# Patient Record
Sex: Male | Born: 1985 | Race: White | Hispanic: No | Smoking: Former smoker
Health system: Southern US, Community
[De-identification: ages and names within clinical notes are randomized; demographics above are authoritative.]

## PROBLEM LIST (undated history)

## (undated) DIAGNOSIS — Z789 Other specified health status: Secondary | ICD-10-CM

## (undated) HISTORY — PX: NO PAST SURGERIES: SHX2092

---

## 2021-03-25 ENCOUNTER — Emergency Department (HOSPITAL_BASED_OUTPATIENT_CLINIC_OR_DEPARTMENT_OTHER): Payer: 59

## 2021-03-25 ENCOUNTER — Other Ambulatory Visit: Payer: Self-pay

## 2021-03-25 ENCOUNTER — Emergency Department (HOSPITAL_BASED_OUTPATIENT_CLINIC_OR_DEPARTMENT_OTHER)
Admission: EM | Admit: 2021-03-25 | Discharge: 2021-03-25 | Disposition: A | Discharge status: Home / Self Care | Payer: 59 | Attending: Emergency Medicine | Admitting: Emergency Medicine

## 2021-03-25 ENCOUNTER — Encounter (HOSPITAL_BASED_OUTPATIENT_CLINIC_OR_DEPARTMENT_OTHER): Payer: Self-pay

## 2021-03-25 DIAGNOSIS — N341 Nonspecific urethritis: Secondary | ICD-10-CM | POA: Insufficient documentation

## 2021-03-25 DIAGNOSIS — L03314 Cellulitis of groin: Secondary | ICD-10-CM | POA: Diagnosis not present

## 2021-03-25 DIAGNOSIS — F17210 Nicotine dependence, cigarettes, uncomplicated: Secondary | ICD-10-CM | POA: Diagnosis not present

## 2021-03-25 DIAGNOSIS — R1909 Other intra-abdominal and pelvic swelling, mass and lump: Secondary | ICD-10-CM | POA: Diagnosis present

## 2021-03-25 DIAGNOSIS — N342 Other urethritis: Secondary | ICD-10-CM

## 2021-03-25 LAB — CBC WITH DIFFERENTIAL/PLATELET
Abs Immature Granulocytes: 0.02 10*3/uL (ref 0.00–0.07)
Basophils Absolute: 0.1 10*3/uL (ref 0.0–0.1)
Basophils Relative: 1 %
Eosinophils Absolute: 0 10*3/uL (ref 0.0–0.5)
Eosinophils Relative: 0 %
HCT: 41.1 % (ref 39.0–52.0)
Hemoglobin: 14.3 g/dL (ref 13.0–17.0)
Immature Granulocytes: 0 %
Lymphocytes Relative: 29 %
Lymphs Abs: 2.1 10*3/uL (ref 0.7–4.0)
MCH: 30.9 pg (ref 26.0–34.0)
MCHC: 34.8 g/dL (ref 30.0–36.0)
MCV: 88.8 fL (ref 80.0–100.0)
Monocytes Absolute: 0.7 10*3/uL (ref 0.1–1.0)
Monocytes Relative: 10 %
Neutro Abs: 4.4 10*3/uL (ref 1.7–7.7)
Neutrophils Relative %: 60 %
Platelets: 181 10*3/uL (ref 150–400)
RBC: 4.63 MIL/uL (ref 4.22–5.81)
RDW: 12.9 % (ref 11.5–15.5)
WBC Morphology: ABNORMAL
WBC: 7.2 10*3/uL (ref 4.0–10.5)
nRBC: 0 % (ref 0.0–0.2)

## 2021-03-25 LAB — URINALYSIS, MICROSCOPIC (REFLEX)

## 2021-03-25 LAB — COMPREHENSIVE METABOLIC PANEL
ALT: 31 U/L (ref 0–44)
AST: 35 U/L (ref 15–41)
Albumin: 4.3 g/dL (ref 3.5–5.0)
Alkaline Phosphatase: 45 U/L (ref 38–126)
Anion gap: 11 (ref 5–15)
BUN: 14 mg/dL (ref 6–20)
CO2: 25 mmol/L (ref 22–32)
Calcium: 9.3 mg/dL (ref 8.9–10.3)
Chloride: 99 mmol/L (ref 98–111)
Creatinine, Ser: 0.92 mg/dL (ref 0.61–1.24)
GFR, Estimated: >60 mL/min (ref >60)
Glucose, Bld: 98 mg/dL (ref 70–99)
Potassium: 3.7 mmol/L (ref 3.5–5.1)
Sodium: 135 mmol/L (ref 135–145)
Total Bilirubin: 0.7 mg/dL (ref 0.3–1.2)
Total Protein: 8.1 g/dL (ref 6.5–8.1)

## 2021-03-25 LAB — URINALYSIS, ROUTINE W REFLEX MICROSCOPIC
Bilirubin Urine: NEGATIVE
Glucose, UA: NEGATIVE mg/dL
Hgb urine dipstick: NEGATIVE
Ketones, ur: 15 mg/dL — AB
Leukocytes,Ua: NEGATIVE
Nitrite: NEGATIVE
Protein, ur: 30 mg/dL — AB
Specific Gravity, Urine: 1.03 (ref 1.005–1.030)
pH: 5 (ref 5.0–8.0)

## 2021-03-25 LAB — LACTIC ACID, PLASMA: Lactic Acid, Venous: 0.9 mmol/L (ref 0.5–1.9)

## 2021-03-25 MED ORDER — IOHEXOL 350 MG/ML SOLN
85.0000 mL | Freq: Once | INTRAVENOUS | Status: AC | PRN
  Administered 2021-03-25: 85 mL via INTRAVENOUS

## 2021-03-25 MED ORDER — DOXYCYCLINE HYCLATE 100 MG PO TABS: 100.0000 mg | ORAL_TABLET | Freq: Two times a day (BID) | ORAL | 0 refills | Status: DC

## 2021-03-25 MED ORDER — LIDOCAINE HCL (PF) 1 % IJ SOLN
1.0000 mL | Freq: Once | INTRAMUSCULAR | Status: AC
  Administered 2021-03-25: 1 mL
  Filled 2021-03-25: qty 5

## 2021-03-25 MED ORDER — CEFTRIAXONE SODIUM 500 MG IJ SOLR
500.0000 mg | Freq: Once | INTRAMUSCULAR | Status: AC
  Administered 2021-03-25: 500 mg via INTRAMUSCULAR
  Filled 2021-03-25: qty 500

## 2021-03-25 MED ORDER — SODIUM CHLORIDE 0.9 % IV SOLN: INTRAVENOUS | Status: DC

## 2021-03-25 NOTE — ED Notes (Signed)
Pt NAD, a/ox4. Pt verbalizes understanding of all DC and f/u instructions. All questions answered. Pt walks with steady gait to lobby at DC.  ? ?

## 2021-03-25 NOTE — Discharge Instructions (Addendum)
Take the antibiotic doxycycline as directed for the next 7 days.  Work note provided.  Return for any new or worse symptoms.  Would expect improvement over the next couple days.  But will not start to improve significantly until after 2 days.  Also there is evidence of urethritis and discharge.  Avoid sexual intercourse until that clears.

## 2021-03-25 NOTE — ED Notes (Signed)
Patient transported to CT 

## 2021-03-25 NOTE — ED Notes (Signed)
Pt back from CT

## 2021-03-25 NOTE — ED Triage Notes (Signed)
Pt c/o swelling/redness to groin/penis yesterday-states may be r/t to spider bite-states he had a fever 3 days ago-NAD-steady gait

## 2021-03-25 NOTE — ED Provider Notes (Signed)
[facility] EMERGENCY DEPARTMENT Provider Note   CSN: [id number] Arrival date & time: 03/25/21  1558     History Chief Complaint  Patient presents with   Groin Swelling    James Obrien is a 35 y.o. male.  Patient with a lot of erythema and some lesions to his suprapubic more left-sided groin area and some lesions on his penis.  And a discharge.  Patient thinks that he got bit by a spider.  Because he found a dead spider in his bed.  On Oct 28, 2022 he had chills that lasted all day long if and he felt quite cold.  He vomited once.  On 10-28-2022 he just had some redness in the suprapubic area.  And then that got significantly worse between 28-Oct-2022 and today.  Some slight difficulty with urination but he is able to urinate.  Patient's temp upon arrival here was 100.1.  Heart rate 113 blood pressure 121/78 respirations 18 oxygen saturation is 98%.  Some concerns for possible early sepsis.  Patient was fairly nontoxic in appearance      History reviewed. No pertinent past medical history.  There are no problems to display for this patient.   History reviewed. No pertinent surgical history.     No family history on file.  Social History   Tobacco Use   Smoking status: Every Day    Types: Cigarettes   Smokeless tobacco: Never  Vaping Use   Vaping Use: Never used  Substance Use Topics   Alcohol use: Yes    Comment: occ   Drug use: Never    Home Medications Prior to Admission medications   Medication Sig Start Date End Date Taking? Authorizing Provider  doxycycline (VIBRA-TABS) 100 MG tablet Take 1 tablet (100 mg total) by mouth 2 (two) times daily for 7 days. 03/25/21 04/01/21 Yes Vanetta Mulders, MD    Allergies    Patient has no known allergies.  Review of Systems   Review of Systems  Constitutional:  Positive for chills and fever.  HENT:  Negative for ear pain and sore throat.   Eyes:  Negative for pain and visual disturbance.  Respiratory:   Negative for cough and shortness of breath.   Cardiovascular:  Negative for chest pain and palpitations.  Gastrointestinal:  Positive for vomiting. Negative for abdominal pain and diarrhea.  Genitourinary:  Positive for difficulty urinating and penile discharge. Negative for dysuria, hematuria, scrotal swelling and testicular pain.  Musculoskeletal:  Negative for arthralgias and back pain.  Skin:  Negative for color change and rash.  Neurological:  Negative for seizures and syncope.  All other systems reviewed and are negative.  Physical Exam Updated Vital Signs BP 128/67 (BP Location: Right Arm)   Pulse 80   Temp 100.1 F (37.8 C) (Oral)   Resp 18   Ht 1.803 m (5\' 11" )   Wt 68.9 kg   SpO2 97%   BMI 21.20 kg/m   Physical Exam Vitals and nursing note reviewed.  Constitutional:      Appearance: Normal appearance. He is well-developed.  HENT:     Head: Normocephalic and atraumatic.  Eyes:     Extraocular Movements: Extraocular movements intact.     Conjunctiva/sclera: Conjunctivae normal.     Pupils: Pupils are equal, round, and reactive to light.  Cardiovascular:     Rate and Rhythm: Normal rate and regular rhythm.     Heart sounds: No murmur heard. Pulmonary:     Effort: Pulmonary effort is normal. No  respiratory distress.     Breath sounds: Normal breath sounds.  Abdominal:     Palpations: Abdomen is soft.     Tenderness: There is no abdominal tenderness.  Genitourinary:    Testes: Normal.     Comments: Left suprapubic and left groin area with a large area of deep erythema measuring about 5 x 10 cm.  With some induration no fluctuance.  There are several closed nonvesicular skin lesions in that area.  And also on his penis.  And there is a urethral purulent discharge.  Testicles without any scrotal swelling no tenderness to the testicle area.  No evidence of any hernia. Musculoskeletal:     Cervical back: Normal range of motion and neck supple.  Skin:    General: Skin  is warm and dry.     Capillary Refill: Capillary refill takes less than 2 seconds.  Neurological:     General: No focal deficit present.     Mental Status: He is alert and oriented to person, place, and time.     Cranial Nerves: No cranial nerve deficit.     Sensory: No sensory deficit.    ED Results / Procedures / Treatments   Labs (all labs ordered are listed, but only abnormal results are displayed) Labs Reviewed  URINALYSIS, ROUTINE W REFLEX MICROSCOPIC - Abnormal; Notable for the following components:      Result Value   Color, Urine AMBER (*)    APPearance CLOUDY (*)    Ketones, ur 15 (*)    Protein, ur 30 (*)    All other components within normal limits  URINALYSIS, MICROSCOPIC (REFLEX) - Abnormal; Notable for the following components:   Bacteria, UA FEW (*)    All other components within normal limits  CULTURE, BLOOD (ROUTINE X 2)  CULTURE, BLOOD (ROUTINE X 2)  COMPREHENSIVE METABOLIC PANEL  CBC WITH DIFFERENTIAL/PLATELET  LACTIC ACID, PLASMA  RPR  HIV ANTIBODY (ROUTINE TESTING W REFLEX)  GC/CHLAMYDIA PROBE AMP ([place]) NOT AT [facility]    EKG None  Radiology CT Abdomen Pelvis W Contrast  Result Date: 03/25/2021 CLINICAL DATA:  Inguinal swelling, abscess EXAM: CT ABDOMEN AND PELVIS WITH CONTRAST TECHNIQUE: Multidetector CT imaging of the abdomen and pelvis was performed using the standard protocol following bolus administration of intravenous contrast. CONTRAST:  92mL OMNIPAQUE IOHEXOL 350 MG/ML SOLN COMPARISON:  None. FINDINGS: Lower chest: No acute pleural or parenchymal lung disease. Hepatobiliary: No focal liver abnormality is seen. No gallstones, gallbladder wall thickening, or biliary dilatation. Pancreas: Unremarkable. No pancreatic ductal dilatation or surrounding inflammatory changes. Spleen: Normal in size without focal abnormality. Adrenals/Urinary Tract: The kidneys enhance normally and symmetrically. No urinary tract calculi or obstructive uropathy. The  bladder is decompressed, which limits evaluation. The adrenals are unremarkable. Stomach/Bowel: No bowel obstruction or ileus. Normal appendix right lower quadrant. No bowel wall thickening or inflammatory change. Vascular/Lymphatic: No significant vascular findings. Lymphadenopathy is seen throughout the pelvis. Largest lymph node in the right external iliac chain measures 16 mm in short axis reference image 67/2. There are numerous enlarged bilateral inguinal lymph nodes, largest on the right measuring up to 13 mm in short axis reference image 76/2. These are likely reactive. Reproductive: Prostate is unremarkable. Other: There is subcutaneous fat stranding within the left lower anterior abdominal wall extending into the left inguinal region, consistent with cellulitis. There is no underlying fluid collection or abscess. No free intraperitoneal fluid or free gas. No abdominal wall hernia. Musculoskeletal: No acute or destructive bony lesions. Reconstructed  images demonstrate no additional findings. IMPRESSION: 1. Subcutaneous fat stranding left lower quadrant anterior abdominal wall and left inguinal region, consistent with cellulitis. No fluid collection or abscess. 2. Reactive lymphadenopathy within the pelvis and bilateral inguinal regions as above. Electronically Signed   By: Sharlet Salina M.D.   On: 03/25/2021 19:05    Procedures Procedures   Medications Ordered in ED Medications  0.9 %  sodium chloride infusion ( Intravenous New Bag/Given 03/25/21 1723)  iohexol (OMNIPAQUE) 350 MG/ML injection 85 mL (85 mLs Intravenous Contrast Given 03/25/21 1755)  cefTRIAXone (ROCEPHIN) injection 500 mg (500 mg Intramuscular Given 03/25/21 2029)  lidocaine (PF) (XYLOCAINE) 1 % injection 1 mL (1 mL Other Given 03/25/21 2028)    ED Course  I have reviewed the triage vital signs and the nursing notes.  Pertinent labs & imaging results that were available during my care of the patient were reviewed by me and  considered in my medical decision making (see chart for details).    MDM Rules/Calculators/A&P                          CRITICAL CARE Performed by: Vanetta Mulders Total critical care time: 35 minutes Critical care time was exclusive of separately billable procedures and treating other patients. Critical care was necessary to treat or prevent imminent or life-threatening deterioration. Critical care was time spent personally by me on the following activities: development of treatment plan with patient and/or surrogate as well as nursing, discussions with consultants, evaluation of patient's response to treatment, examination of patient, obtaining history from patient or surrogate, ordering and performing treatments and interventions, ordering and review of laboratory studies, ordering and review of radiographic studies, pulse oximetry and re-evaluation of patient's condition.   Patient's lactic acid not elevated.  Blood cultures were sent.  Complete metabolic panel without any abnormalities no liver function test abnormalities.  No leukocytosis.  White blood cell count 7.2.  Hemoglobin 14.3.  Urinalysis not consistent with urinary tract infection.  Clinically there is a urethral discharge.  STD stuff sent.  CT abdomen pelvis shows just a cellulitis in that area no deep space infections.  Patient will be treated with doxycycline for the cellulitis and also treated with Rocephin for STD.  The doxycycline for STD as well.  Patient stable for discharge home.  Patient will return for any new or worse symptoms. Final Clinical Impression(s) / ED Diagnoses Final diagnoses:  Cellulitis of groin  Urethritis    Rx / DC Orders ED Discharge Orders          Ordered    doxycycline (VIBRA-TABS) 100 MG tablet  2 times daily        03/25/21 2020             Vanetta Mulders, MD 03/25/21 2045

## 2021-03-26 LAB — GC/CHLAMYDIA PROBE AMP (~~LOC~~) NOT AT ARMC
Chlamydia: NEGATIVE
Comment: NEGATIVE
Comment: NORMAL
Neisseria Gonorrhea: POSITIVE — AB

## 2021-03-26 LAB — HIV ANTIBODY (ROUTINE TESTING W REFLEX): HIV Screen 4th Generation wRfx: NONREACTIVE

## 2021-03-26 LAB — RPR: RPR Ser Ql: NONREACTIVE

## 2021-03-28 ENCOUNTER — Emergency Department (HOSPITAL_COMMUNITY): Payer: 59

## 2021-03-28 ENCOUNTER — Inpatient Hospital Stay (HOSPITAL_COMMUNITY)
Admission: EM | Admit: 2021-03-28 | Discharge: 2021-04-06 | DRG: 098 | Disposition: A | Discharge status: Rehabilitation Facility | Payer: 59 | Attending: Internal Medicine | Admitting: Internal Medicine

## 2021-03-28 ENCOUNTER — Observation Stay (HOSPITAL_COMMUNITY): Payer: 59

## 2021-03-28 ENCOUNTER — Other Ambulatory Visit: Payer: Self-pay

## 2021-03-28 ENCOUNTER — Encounter (HOSPITAL_COMMUNITY): Payer: Self-pay | Admitting: Radiology

## 2021-03-28 DIAGNOSIS — A5409 Other gonococcal infection of lower genitourinary tract: Secondary | ICD-10-CM | POA: Diagnosis present

## 2021-03-28 DIAGNOSIS — G0481 Other encephalitis and encephalomyelitis: Secondary | ICD-10-CM | POA: Diagnosis present

## 2021-03-28 DIAGNOSIS — T380X5A Adverse effect of glucocorticoids and synthetic analogues, initial encounter: Secondary | ICD-10-CM | POA: Diagnosis present

## 2021-03-28 DIAGNOSIS — R569 Unspecified convulsions: Secondary | ICD-10-CM | POA: Diagnosis not present

## 2021-03-28 DIAGNOSIS — F17210 Nicotine dependence, cigarettes, uncomplicated: Secondary | ICD-10-CM | POA: Diagnosis present

## 2021-03-28 DIAGNOSIS — L0390 Cellulitis, unspecified: Secondary | ICD-10-CM | POA: Diagnosis present

## 2021-03-28 DIAGNOSIS — Z79899 Other long term (current) drug therapy: Secondary | ICD-10-CM

## 2021-03-28 DIAGNOSIS — R339 Retention of urine, unspecified: Secondary | ICD-10-CM | POA: Diagnosis present

## 2021-03-28 DIAGNOSIS — A419 Sepsis, unspecified organism: Secondary | ICD-10-CM

## 2021-03-28 DIAGNOSIS — K592 Neurogenic bowel, not elsewhere classified: Secondary | ICD-10-CM | POA: Diagnosis present

## 2021-03-28 DIAGNOSIS — R3915 Urgency of urination: Secondary | ICD-10-CM | POA: Diagnosis present

## 2021-03-28 DIAGNOSIS — G0490 Encephalitis and encephalomyelitis, unspecified: Secondary | ICD-10-CM | POA: Diagnosis not present

## 2021-03-28 DIAGNOSIS — N319 Neuromuscular dysfunction of bladder, unspecified: Secondary | ICD-10-CM | POA: Diagnosis present

## 2021-03-28 DIAGNOSIS — E872 Acidosis, unspecified: Secondary | ICD-10-CM | POA: Diagnosis present

## 2021-03-28 DIAGNOSIS — L03314 Cellulitis of groin: Secondary | ICD-10-CM | POA: Diagnosis present

## 2021-03-28 DIAGNOSIS — Z20822 Contact with and (suspected) exposure to covid-19: Secondary | ICD-10-CM | POA: Diagnosis present

## 2021-03-28 DIAGNOSIS — G8221 Paraplegia, complete: Secondary | ICD-10-CM | POA: Diagnosis present

## 2021-03-28 DIAGNOSIS — R079 Chest pain, unspecified: Secondary | ICD-10-CM

## 2021-03-28 DIAGNOSIS — A549 Gonococcal infection, unspecified: Secondary | ICD-10-CM | POA: Diagnosis present

## 2021-03-28 DIAGNOSIS — G40409 Other generalized epilepsy and epileptic syndromes, not intractable, without status epilepticus: Secondary | ICD-10-CM | POA: Diagnosis present

## 2021-03-28 DIAGNOSIS — L03311 Cellulitis of abdominal wall: Secondary | ICD-10-CM | POA: Diagnosis present

## 2021-03-28 DIAGNOSIS — E876 Hypokalemia: Secondary | ICD-10-CM | POA: Diagnosis present

## 2021-03-28 DIAGNOSIS — R001 Bradycardia, unspecified: Secondary | ICD-10-CM

## 2021-03-28 DIAGNOSIS — K5900 Constipation, unspecified: Secondary | ICD-10-CM | POA: Diagnosis not present

## 2021-03-28 DIAGNOSIS — G0491 Myelitis, unspecified: Secondary | ICD-10-CM | POA: Diagnosis present

## 2021-03-28 HISTORY — DX: Other specified health status: Z78.9

## 2021-03-28 LAB — CBC WITH DIFFERENTIAL/PLATELET
Abs Immature Granulocytes: 0 10*3/uL (ref 0.00–0.07)
Basophils Absolute: 0.1 10*3/uL (ref 0.0–0.1)
Basophils Relative: 1 %
Eosinophils Absolute: 0.1 10*3/uL (ref 0.0–0.5)
Eosinophils Relative: 1 %
HCT: 39.2 % (ref 39.0–52.0)
Hemoglobin: 12.9 g/dL — ABNORMAL LOW (ref 13.0–17.0)
Lymphocytes Relative: 20 %
Lymphs Abs: 2.2 10*3/uL (ref 0.7–4.0)
MCH: 30.6 pg (ref 26.0–34.0)
MCHC: 32.9 g/dL (ref 30.0–36.0)
MCV: 92.9 fL (ref 80.0–100.0)
Monocytes Absolute: 0.6 10*3/uL (ref 0.1–1.0)
Monocytes Relative: 6 %
Neutro Abs: 7.8 10*3/uL — ABNORMAL HIGH (ref 1.7–7.7)
Neutrophils Relative %: 72 %
Platelets: 214 10*3/uL (ref 150–400)
RBC: 4.22 MIL/uL (ref 4.22–5.81)
RDW: 13.2 % (ref 11.5–15.5)
WBC: 10.8 10*3/uL — ABNORMAL HIGH (ref 4.0–10.5)
nRBC: 0 % (ref 0.0–0.2)
nRBC: 0 /100 WBC

## 2021-03-28 LAB — COMPREHENSIVE METABOLIC PANEL
ALT: 25 U/L (ref 0–44)
AST: 36 U/L (ref 15–41)
Albumin: 3.6 g/dL (ref 3.5–5.0)
Alkaline Phosphatase: 39 U/L (ref 38–126)
Anion gap: 11 (ref 5–15)
BUN: 13 mg/dL (ref 6–20)
CO2: 23 mmol/L (ref 22–32)
Calcium: 8.7 mg/dL — ABNORMAL LOW (ref 8.9–10.3)
Chloride: 104 mmol/L (ref 98–111)
Creatinine, Ser: 1.27 mg/dL — ABNORMAL HIGH (ref 0.61–1.24)
GFR, Estimated: >60 mL/min (ref >60)
Glucose, Bld: 103 mg/dL — ABNORMAL HIGH (ref 70–99)
Potassium: 4.8 mmol/L (ref 3.5–5.1)
Sodium: 138 mmol/L (ref 135–145)
Total Bilirubin: 0.4 mg/dL (ref 0.3–1.2)
Total Protein: 6.9 g/dL (ref 6.5–8.1)

## 2021-03-28 LAB — URINALYSIS, ROUTINE W REFLEX MICROSCOPIC
Bilirubin Urine: NEGATIVE
Glucose, UA: NEGATIVE mg/dL
Hgb urine dipstick: NEGATIVE
Ketones, ur: NEGATIVE mg/dL
Leukocytes,Ua: NEGATIVE
Nitrite: NEGATIVE
Protein, ur: NEGATIVE mg/dL
Specific Gravity, Urine: 1.041 — ABNORMAL HIGH (ref 1.005–1.030)
pH: 6 (ref 5.0–8.0)

## 2021-03-28 LAB — I-STAT CHEM 8, ED
BUN: 12 mg/dL (ref 6–20)
Calcium, Ion: 1.12 mmol/L — ABNORMAL LOW (ref 1.15–1.40)
Chloride: 104 mmol/L (ref 98–111)
Creatinine, Ser: 1.1 mg/dL (ref 0.61–1.24)
Glucose, Bld: 102 mg/dL — ABNORMAL HIGH (ref 70–99)
HCT: 34 % — ABNORMAL LOW (ref 39.0–52.0)
Hemoglobin: 11.6 g/dL — ABNORMAL LOW (ref 13.0–17.0)
Potassium: 4.3 mmol/L (ref 3.5–5.1)
Sodium: 140 mmol/L (ref 135–145)
TCO2: 26 mmol/L (ref 22–32)

## 2021-03-28 LAB — MAGNESIUM: Magnesium: 2.3 mg/dL (ref 1.7–2.4)

## 2021-03-28 LAB — LACTIC ACID, PLASMA
Lactic Acid, Venous: 4.1 mmol/L (ref 0.5–1.9)
Lactic Acid, Venous: 1 mmol/L (ref 0.5–1.9)

## 2021-03-28 LAB — PROTIME-INR
INR: 1.1 (ref 0.8–1.2)
Prothrombin Time: 13.9 seconds (ref 11.4–15.2)

## 2021-03-28 LAB — APTT: aPTT: 28 seconds (ref 24–36)

## 2021-03-28 MED ORDER — LEVETIRACETAM 500 MG PO TABS
500.0000 mg | ORAL_TABLET | Freq: Two times a day (BID) | ORAL | Status: DC
  Administered 2021-03-29 – 2021-04-06 (×18): 500 mg via ORAL
  Filled 2021-03-28 (×4): qty 1
  Filled 2021-03-28: qty 2
  Filled 2021-03-28 (×10): qty 1
  Filled 2021-03-28: qty 2
  Filled 2021-03-28 (×2): qty 1

## 2021-03-28 MED ORDER — LACTATED RINGERS IV SOLN: INTRAVENOUS | Status: DC

## 2021-03-28 MED ORDER — IOHEXOL 300 MG/ML  SOLN
100.0000 mL | Freq: Once | INTRAMUSCULAR | Status: AC | PRN
  Administered 2021-03-28: 100 mL via INTRAVENOUS

## 2021-03-28 MED ORDER — ACETAMINOPHEN 325 MG PO TABS
650.0000 mg | ORAL_TABLET | Freq: Four times a day (QID) | ORAL | Status: DC | PRN
  Administered 2021-03-29: 650 mg via ORAL
  Administered 2021-03-29: 975 mg via ORAL
  Administered 2021-03-30 – 2021-04-05 (×3): 650 mg via ORAL
  Filled 2021-03-28 (×5): qty 2

## 2021-03-28 MED ORDER — SODIUM CHLORIDE 0.9 % IV BOLUS
1000.0000 mL | Freq: Once | INTRAVENOUS | Status: AC
Start: 2021-03-28 — End: 2021-03-28
  Administered 2021-03-28: 1000 mL via INTRAVENOUS

## 2021-03-28 MED ORDER — LORAZEPAM 2 MG/ML IJ SOLN: 1.0000 mg | INTRAMUSCULAR | Status: DC | PRN

## 2021-03-28 MED ORDER — LEVETIRACETAM IN NACL 1000 MG/100ML IV SOLN
1000.0000 mg | Freq: Once | INTRAVENOUS | Status: AC
  Administered 2021-03-28: 1000 mg via INTRAVENOUS
  Filled 2021-03-28: qty 100

## 2021-03-28 MED ORDER — LACTATED RINGERS IV BOLUS
1000.0000 mL | Freq: Once | INTRAVENOUS | Status: AC
  Administered 2021-03-28: 1000 mL via INTRAVENOUS

## 2021-03-28 MED ORDER — ACETAMINOPHEN 650 MG RE SUPP: 650.0000 mg | Freq: Four times a day (QID) | RECTAL | Status: DC | PRN

## 2021-03-28 MED ORDER — SODIUM CHLORIDE 0.9 % IV SOLN: INTRAVENOUS | Status: DC

## 2021-03-28 MED ORDER — ENOXAPARIN SODIUM 40 MG/0.4ML IJ SOSY
40.0000 mg | PREFILLED_SYRINGE | INTRAMUSCULAR | Status: DC
  Administered 2021-03-28: 40 mg via SUBCUTANEOUS
  Filled 2021-03-28: qty 0.4

## 2021-03-28 NOTE — ED Triage Notes (Signed)
Pt presented with [facility] with c/c of seizure. Pt is from work [employer]), pt was bitten by spider 1 week ago and was given antibiotics. Today pt was complaining of tingling to left hand. Pt was sitting in office when he started having grand mal seizures and fell out of chair but didn't hit head. Coworkers stated seizure last 10 mins. Pt didn't remember any of event. But still had L arm drift when he begin to go into another grand mal seizure with EMS. EMS stated seizure was face twisting with decorticate posturing last 1.5 mins.  5 versed IM.  106/54, 94HR, 93 %RA --> 100 2L, CBG 116

## 2021-03-28 NOTE — H&P (Addendum)
History and Physical    James Obrien JGG:[id number] DOB: 08-12-1985 DOA: 03/28/2021  PCP: Pcp, No Consultants:  none Patient coming from: work via EMS. Lives at home with his wife and kids.   Chief Complaint: new onset seizure   HPI: James Obrien is a 35 y.o. male with no medical history. He states he was at work today and around [place] started to have tingling and numbness in his left hand. Within one hour he had numerous episodes of tingling, shaking, twitching, weakness and loss of control of his left hand. He went to tell his boss who called someone to come help. He thinks around 12:00 he was waiting and all he remembers is shaking all over and falling. Per EDP note who talked to a colleague, they witnessed a generalized tonic-clonic like seizure activity that lasted 2-5 minutes. He woke up in the ambulance. He bit his tongue, but doesn't think he had any urinary loss.  He states he was drowsy, but not confused. No headache. In the ambulance he had another witnessed full tonic clonic seizure and all he remembers is waking up in the hospital. He was given IM 80m versed. He has a mild headache now, but otherwise feels okay. He still has residual left sided weakness. He is left handed.   He has no history or family history of seizures. Denies any alcohol or drug use. Smokes variable amounts daily. Denies any recent trauma, more specifically brain injury. Denies any vision changes, dizziness, chest pain, palpitations, shortness of breath, cough, abdominal pain, N/V/D, dysuria, leg swelling.   He did wake up on Monday with a bite In his left groin and went to ED. He had lesions on suprapubic, left groin area and some on his penis with some discharge. He said he thinks there was a dead spider in his bed, but can't tell me if any tick bites. Saturday he had a fever and vomited x 1. He had some difficulty urinating, but was able to void. Given rocephin and course of doxycycline. STD panel  shows gonorrhea.     ED Course: vitals: temperature 100.1, blood pressure 121/78, heart rate 113, respiratory rate 18, oxygen 98% on room air. Pertinent labs: WBC 10.8, lactic acid 4.1-->repeat pending,  glucose 102, Chest x-ray no acute findings borderline cardiomegaly.  CT head with no acute intracranial abnormalities.  CT abdominal pelvis shows no acute findings, improved largely resolved left groin inflammatory process/cellulitis.  Persistent but slightly improved pelvic and inguinal lymphadenopathy, likely inflammatory/reactive.  Patient given 1 L of normal saline and 1 L of LR.  Loaded with Keppra.  Cultures pending neurology consulted and [facility] was asked to admit.  Review of Systems: As per HPI; otherwise review of systems reviewed and negative.   Ambulatory Status:  Ambulates without assistance   History reviewed. No pertinent past medical history.  No past surgical history on file.  Social History   Socioeconomic History   Marital status: Married    Spouse name: Not on file   Number of children: Not on file   Years of education: Not on file   Highest education level: Not on file  Occupational History   Not on file  Tobacco Use   Smoking status: Every Day    Types: Cigarettes   Smokeless tobacco: Never  Vaping Use   Vaping Use: Never used  Substance and Sexual Activity   Alcohol use: Yes    Comment: occ   Drug use: Never   Sexual activity:  Not on file  Other Topics Concern   Not on file  Social History Narrative   Not on file   Social Determinants of Health   Financial Resource Strain: Not on file  Food Insecurity: Not on file  Transportation Needs: Not on file  Physical Activity: Not on file  Stress: Not on file  Social Connections: Not on file  Intimate Partner Violence: Not on file    No Known Allergies  No family history on file.  Prior to Admission medications   Medication Sig Start Date End Date Taking? Authorizing Provider  doxycycline  (VIBRA-TABS) 100 MG tablet Take 1 tablet (100 mg total) by mouth 2 (two) times daily for 7 days. 03/25/21 04/01/21 Yes Fredia Sorrow, MD  TYLENOL 500 MG tablet Take 500-1,000 mg by mouth every 6 (six) hours as needed (FOR HEADACHES).   Yes [provider]    Physical Exam: Vitals:   03/28/21 1630 03/28/21 1645 03/28/21 1700 03/28/21 1745  BP: 124/72 127/71 124/69 123/68  Pulse: 76 75 67 78  Resp: (!) 25 (!) 23 (!) 26 (!) 24  Temp:      TempSrc:      SpO2: 100% 98% 97% 100%  Weight:      Height:         General:  Appears calm and comfortable and is in NAD Eyes:  PERRL, EOMI, normal lids, iris ENT:  grossly normal hearing, lips & tongue, dry mucous membranes. Tongue with bite and dried blood on right side; appropriate dentition Neck:  no LAD, masses or thyromegaly; no carotid bruits Cardiovascular:  RRR, no m/r/g. No LE edema.  Respiratory:   CTA bilaterally with no wheezes/rales/rhonchi.  Normal respiratory effort. Abdomen:  soft, TTP over suprapubic area, ND, NABS Back:   normal alignment, no CVAT Skin: erythema over left lateral groin with well demarcated borders, not raised.  Musculoskeletal:  right RUE: 5/5. Left UE: 4/5, bilateral LE weak due to pain in bladder. Hand drip decreased on left side. good ROM, no bony abnormality Lower extremity:  No LE edema.  Limited foot exam with no ulcerations.  2+ distal pulses. Psychiatric:  grossly normal mood and affect, speech fluent and appropriate, AOx3 Neurologic:  CN 2-12 grossly intact, moves all extremities in coordinated fashion, sensation intact. Gait deferred.     Radiological Exams on Admission: Independently reviewed - see discussion in A/P where applicable  DG Chest 1 View  Addendum Date: 03/28/2021   ADDENDUM REPORT: 03/28/2021 15:57 Electronically Signed   By: Donavan Foil M.D.   On: 03/28/2021 15:57   Result Date: 03/28/2021 CLINICAL DATA:  Seizure and fell out of chair EXAM: CHEST  1 VIEW COMPARISON:  None.  FINDINGS: No focal opacity or pleural effusion. Borderline cardiomegaly. No pneumothorax. IMPRESSION: Borderline cardiomegaly without edema or focal airspace disease. Electronically Signed: By: Donavan Foil M.D. On: 03/28/2021 15:55   CT Head Wo Contrast  Result Date: 03/28/2021 CLINICAL DATA:  Seizure. EXAM: CT HEAD WITHOUT CONTRAST TECHNIQUE: Contiguous axial images were obtained from the base of the skull through the vertex without intravenous contrast. COMPARISON:  None. FINDINGS: Brain: No evidence of acute infarction, hemorrhage, hydrocephalus, extra-axial collection or mass lesion/mass effect. Vascular: No hyperdense vessel or unexpected calcification. Skull: Normal. Negative for fracture or focal lesion. Sinuses/Orbits: Air-fluid level noted in the left maxillary sinus. Other: None. IMPRESSION: 1. No acute intracranial abnormalities. 2. Air-fluid level within the left maxillary sinus. Electronically Signed   By: Kerby Moors M.D.   On:  03/28/2021 15:49   CT Abdomen Pelvis W Contrast  Result Date: 03/28/2021 CLINICAL DATA:  Left groin pain. EXAM: CT ABDOMEN AND PELVIS WITH CONTRAST TECHNIQUE: Multidetector CT imaging of the abdomen and pelvis was performed using the standard protocol following bolus administration of intravenous contrast. CONTRAST:  150m OMNIPAQUE IOHEXOL 300 MG/ML  SOLN COMPARISON:  CT scan 03/25/2021 FINDINGS: Lower chest: The lung bases are clear of acute process. No pleural effusion or pulmonary lesions. The heart is normal in size. No pericardial effusion. The distal esophagus and aorta are unremarkable. Hepatobiliary: No hepatic lesions or intrahepatic biliary dilatation. The gallbladder is contracted. No common bile duct dilatation. Pancreas: No mass, inflammation or ductal dilatation. Spleen: Normal size.  No focal lesions. Adrenals/Urinary Tract: Adrenal glands and kidneys are. The bladder is normal. Stomach/Bowel: The stomach, duodenum, small bowel and colon are  unremarkable. The terminal ileum and appendix are normal. Vascular/Lymphatic: The aorta is normal in caliber. No dissection. The branch vessels are patent. The major venous structures are patent. No mesenteric or retroperitoneal mass or adenopathy. Small scattered lymph nodes are noted. Reproductive: The prostate gland and seminal vesicles are unremarkable. Other: Similar to the prior CT scan there are enlarged pelvic and inguinal lymph nodes. Some improvement when compared to the prior study suggesting these are inflammatory/reactive. The left groin inflammatory process/cellulitis is much improved. No abscess. Musculoskeletal: No significant bony findings. IMPRESSION: 1. No acute abdominal/pelvic findings. 2. Improved/largely resolved left groin inflammatory process/cellulitis. 3. Persistent but slightly improved pelvic and inguinal lymphadenopathy, likely inflammatory/reactive. Electronically Signed   By: PMarijo SanesM.D.   On: 03/28/2021 16:00    EKG: Independently reviewed.  NSR with rate 82; nonspecific ST changes with no evidence of acute ischemia   Labs on Admission: I have personally reviewed the available labs and imaging studies at the time of the admission.  Pertinent labs:  WBC 10.8,  lactic acid 4.1-->repeat pending,   glucose 102,    Assessment/Plan Principal Problem:   Seizure (HSidney -35year old with no past medical history with 2 witnessed generalized tonic-clonic seizures with residual left upper extremity weakness -Place him on telemetry -Neurology consulted and appreciate -Patient has been pan cultured. Sepsis criteria not met.  Lab work-up has so far been unremarkable.  Does have recent infection of gonorrhea and cellulitis that could be underlying driving force for seizure. -No family history of seizures, drug abuse, alcohol use or any precipitating brain injury. -CT head unremarkable -Given Keppra in ED. Continued AED per neurology  -Follow-up on neurology  recommendations/eeg/etc.   Active Problems:   Gonorrhea -Patient with urethral discharge, suprapubic penile lesions and positive gonorrhea probe on March 25, 2021. -Was treated appropriately with dose of Rocephin -Does have fever, but no other signs or symptoms of disseminated gonorrhea -Continue IV Rocephin while inpatient for ongoing cellulitis and possible need for higher MIC of rocephin for possible resistance at lower MIC.  -HIV and RPR were negative we will check hepatitis panel    Cellulitis -Cellulitis in setting of mildly elevated WBC, fever and elevated lactic acid albeit unsure how accurate this is in setting of 2 seizures. -Blood cultures pending and will continue him on IV Rocephin for coverage of cellulitis/gonorrheal infection possibly requiring higher MIC of abx with increased resistance.  -CT abdominal scan shows improvement of cellulitis. -Unsure if underlying driving driving force of new onset seizures  Urinary retention -Had complaints on ER visit on 9/26 with was able to successfully void -Bladder scan shows 400 cc of urine -In  and out cath x1 and will see if able to void on own unsure if due to seizure or gonorrhea infection  -If retention continues will need indwelling Foley placed  Body mass index is 23.01 kg/m.    Level of care: Telemetry Medical DVT prophylaxis:  Lovenox  Code Status:  Full - confirmed with patient Family Communication: None present Patient from home   Anticipated d/c is to: home   Requires inpatient hospitalization and is at significant risk of neurological worsening, requires constant monitoring, assessment and MDM with specialists.    Patient is currently: acutely ill Consults called: neurology   Admission status:  observation   Dragon dictation used in completing this note.    Orma Flaming MD [facility]   How to contact the [facility] Attending or Consulting provider [place] or covering provider during after hours [place], for this patient?  Check the care team in [facility] and look for a) attending/consulting [facility] provider listed and b) the [facility] team listed Log into [email or web address] and use [place]'s universal password to access. If you do not have the password, please contact the hospital operator. Locate the [facility] provider you are looking for under [facility] and page to a number that you can be directly reached. If you still have difficulty reaching the provider, please page the [facility] (Director on Call) for the Hospitalists listed on amion for assistance.   03/28/2021, 7:42 PM

## 2021-03-28 NOTE — Consult Note (Signed)
NEUROLOGY CONSULTATION NOTE   Date of service: March 28, 2021 Patient Name: James Obrien MRN:  [id number] DOB:  1986/05/01 Reason for consult: "Seizure" Requesting Provider: Orland Mustard, MD _ _ _   _ __   _ __ _ _  __ __   _ __   __ _  History of Present Illness  James Obrien is a 35 y.o. male with PMH significant for recent penile and scrotal lesions thought to be due to a spider bite, current everyday smoker who presents with seizure x 2.  Reports he was at work when around [employer], started having L hand pins and needle sensation. Intermittently, his left hand would shake for a couple mins. This happened 5-6 times within a couple hours. During one of these, his L eye started twitching, he then could not stand and then went into a GTC seizure lasting 2-5 mins. EMS arrived and he had another episode and was given Versed 5mg  IM once.  No prior hx of seizures or childhood seizure. No EtOh use. No recreational drugs, no prior hx of significant head injury, no starring off events in childhood. No family hx of seizures. No hx of MS.  He has a headache right now and some difficulty with urinating. No fever, no chills, no upper respiratory symptoms. No symptoms of UTI but does endorse that he is unable to urinate right now even thou he has an urge to urinate. Does not endorse symptoms of gastroenteritis. Endorses sleeping only 5 hours a day for the last couple of days. Family financials have been stressing him out.   ROS   Constitutional Denies weight loss, fever and chills.   HEENT Denies changes in vision and hearing.   Respiratory Denies SOB and cough.   CV Denies palpitations and CP   GI Denies abdominal pain, nausea, vomiting and diarrhea.   GU Denies dysuria and urinary frequency.   MSK Denies myalgia and joint pain.   Skin Denies rash and pruritus.   Neurological Endorses a mild headache but no syncope.   Psychiatric Denies recent changes in mood. Denies anxiety and  depression.    Past History   Past Medical History:  Diagnosis Date  . Medical history non-contributory    Past Surgical History:  Procedure Laterality Date  . NO PAST SURGERIES     Family History  Problem Relation Age of Onset  . Seizures Neg Hx    Social History   Socioeconomic History  . Marital status: Married    Spouse name: Not on file  . Number of children: Not on file  . Years of education: Not on file  . Highest education level: Not on file  Occupational History  . Not on file  Tobacco Use  . Smoking status: Every Day    Types: Cigarettes  . Smokeless tobacco: Never  Vaping Use  . Vaping Use: Never used  Substance and Sexual Activity  . Alcohol use: Yes    Comment: occ  . Drug use: Never  . Sexual activity: Not on file  Other Topics Concern  . Not on file  Social History Narrative   Only one in family who works.   Social Determinants of Health   Financial Resource Strain: Not on file  Food Insecurity: Not on file  Transportation Needs: Not on file  Physical Activity: Not on file  Stress: Not on file  Social Connections: Not on file   No Known Allergies  Medications  (Not in a hospital  admission)    Vitals   Vitals:   03/28/21 1630 03/28/21 1645 03/28/21 1700 03/28/21 1745  BP: 124/72 127/71 124/69 123/68  Pulse: 76 75 67 78  Resp: (!) 25 (!) 23 (!) 26 (!) 24  Temp:      TempSrc:      SpO2: 100% 98% 97% 100%  Weight:      Height:         Body mass index is 23.01 kg/m.  Physical Exam   General: Laying comfortably in bed; in no acute distress.  HENT: Normal oropharynx and mucosa. Normal external appearance of ears and nose.  Neck: Supple, no pain or tenderness  CV: No JVD. No peripheral edema.  Pulmonary: Symmetric Chest rise. Normal respiratory effort.  Abdomen: Soft to touch, non-tender.  Ext: No cyanosis, edema, or deformity  Skin: No rash. Normal palpation of skin.   Musculoskeletal: Normal digits and nails by inspection.  No clubbing.   Neurologic Examination  Mental status/Cognition: Alert, oriented to self, place, month and year, good attention.  Speech/language: Fluent, comprehension intact, object naming intact, repetition intact.  Cranial nerves:   CN II Pupils equal and reactive to light, no VF deficits    CN III,IV,VI EOM intact, no gaze preference or deviation, no nystagmus    CN V normal sensation in V1, V2, and V3 segments bilaterally    CN VII no asymmetry, no nasolabial fold flattening    CN VIII normal hearing to speech    CN IX & X normal palatal elevation, no uvular deviation    CN XI 5/5 head turn and 5/5 shoulder shrug bilaterally    CN XII midline tongue protrusion    Motor:  Muscle bulk: normal, tone normal, pronator drift none tremor none Mvmt Root Nerve  Muscle Right Left Comments  SA C5/6 Ax Deltoid 5 5   EF C5/6 Mc Biceps 5 5   EE C6/7/8 Rad Triceps 5 5   WF C6/7 Med FCR     WE C7/8 PIN ECU     F Ab C8/T1 U ADM/FDI 5 4+   HF L1/2/3 Fem Illopsoas 5 5   KE L2/3/4 Fem Quad 5 5   DF L4/5 D Peron Tib Ant 5 5   PF S1/2 Tibial Grc/Sol 5 5    Reflexes:  Right Left Comments  Pectoralis      Biceps (C5/6) 2+ 2+   Brachioradialis (C5/6) 2+ 2+    Triceps (C6/7) 2+ 2+    Patellar (L3/4) 3 3    Achilles (S1) 2 2    Hoffman      Plantar withdraws withdraws   Jaw jerk    Sensation:  Light touch Mildly decreased to touch in palmer aspect of left hand.   Pin prick    Temperature    Vibration   Proprioception    Coordination/Complex Motor:  - Finger to Nose intact BL - Heel to shin intact BL - Rapid alternating movement are intact - Gait: unsafe to assess given 2 GTC seizures today and potentially multiple focal seizures with retained awareness.  Labs   CBC:  Recent Labs  Lab 03/25/21 1705 03/28/21 1444 03/28/21 1525  WBC 7.2 10.8*  --   NEUTROABS 4.4 7.8*  --   HGB 14.3 12.9* 11.6*  HCT 41.1 39.2 34.0*  MCV 88.8 92.9  --   PLT 181 214  --     Basic Metabolic  Panel:  Lab Results  Component Value Date   NA 140  03/28/2021   K 4.3 03/28/2021   CO2 23 03/28/2021   GLUCOSE 102 (H) 03/28/2021   BUN 12 03/28/2021   CREATININE 1.10 03/28/2021   CALCIUM 8.7 (L) 03/28/2021   GFRNONAA >60 03/28/2021   Lipid Panel: No results found for: LDLCALC HgbA1c: No results found for: HGBA1C Urine Drug Screen: No results found for: LABOPIA, COCAINSCRNUR, LABBENZ, AMPHETMU, THCU, LABBARB  Alcohol Level No results found for: ETH  CT Head without contrast: Personally reviewed and CTH was negative for a large hypodensity concerning for a large territory infarct or hyperdensity concerning for an ICH  MRI Brain: pending  rEEG:  pending  Impression   James Obrien is a 35 y.o. male with PMH significant for everyday smoker who presents with multiple focal seizures with retained awareness and 2 focal seizures with secondary generalization. No prior hx of seizures, no obvious seizure risk factors on history. Unclear at this time as to why he had seizures.  Given multiple seizures in a 24 hours period, would recommend observation overnight to monitor for seizures clustering.  Impression: Focal seizures with secondary Generalization.  Recommendations  - Seizure precautions - Keppra PO 500mg  BID - MRI Brain without contrast - routine EEG - observation overnight to monitor for seizure clustering. - Ativan 2mg  for seizure lasting more than 3 mins. - Full seizure precautions listed below. __________________________________________________________________    Thank you for the opportunity to take part in the care of this patient. If you have any further questions, please contact the neurology consultation attending.  Signed,  [facility] Pager Number _ _ _   _ __   _ __ _ _  __ __   _ __   __ _   Seizure precautions: Per [facility] statutes, patients with seizures are not allowed to drive until they  have been seizure-free for six months and cleared by a physician    Use caution when using heavy equipment or power tools. Avoid working on ladders or at heights. Take showers instead of baths. Ensure the water temperature is not too high on the home water heater. Do not go swimming alone. Do not lock yourself in a room alone (i.e. bathroom). When caring for infants or small children, sit down when holding, feeding, or changing them to minimize risk of injury to the child in the event you have a seizure. Maintain good sleep hygiene. Avoid alcohol.    If patient has another seizure, call 911 and bring them back to the ED if: A.  The seizure lasts longer than 5 minutes.      B.  The patient doesn't wake shortly after the seizure or has new problems such as difficulty seeing, speaking or moving following the seizure C.  The patient was injured during the seizure D.  The patient has a temperature over 102 F (39C) E.  The patient vomited during the seizure and now is having trouble breathing    During the Seizure   - First, ensure adequate ventilation and place patients on the floor on their left side  Loosen clothing around the neck and ensure the airway is patent. If the patient is clenching the teeth, do not force the mouth open with any object as this can cause severe damage - Remove all items from the surrounding that can be hazardous. The patient may be oblivious to what's happening and may not even know what he or she is doing. If the patient is confused and wandering,  either gently guide him/her away and block access to outside areas - Reassure the individual and be comforting - Call 911. In most cases, the seizure ends before EMS arrives. However, there are cases when seizures may last over 3 to 5 minutes. Or the individual may have developed breathing difficulties or severe injuries. If a pregnant patient or a person with diabetes develops a seizure, it is prudent to call an ambulance. -  Finally, if the patient does not regain full consciousness, then call EMS. Most patients will remain confused for about 45 to 90 minutes after a seizure, so you must use judgment in calling for help. - Avoid restraints but make sure the patient is in a bed with padded side rails - Place the individual in a lateral position with the neck slightly flexed; this will help the saliva drain from the mouth and prevent the tongue from falling backward - Remove all nearby furniture and other hazards from the area - Provide verbal assurance as the individual is regaining consciousness - Provide the patient with privacy if possible - Call for help and start treatment as ordered by the caregiver    After the Seizure (Postictal Stage)   After a seizure, most patients experience confusion, fatigue, muscle pain and/or a headache. Thus, one should permit the individual to sleep. For the next few days, reassurance is essential. Being calm and helping reorient the person is also of importance.   Most seizures are painless and end spontaneously. Seizures are not harmful to others but can lead to complications such as stress on the lungs, brain and the heart. Individuals with prior lung problems may develop labored breathing and respiratory distress.   Erick Blinks [facility] Pager Number [phone]

## 2021-03-28 NOTE — ED Provider Notes (Signed)
[facility] EMERGENCY DEPARTMENT Provider Note   CSN: [id number] Arrival date & time: 03/28/21  1424     History Chief Complaint  Patient presents with   Seizures    James Obrien is a 35 y.o. male.  35 year old male brought in by EMS from work after witnessed seizure at work today.  Per EMS, patient was at work today complaining of tingling in his left hand. He was sitting in the office we started having a grand mal seizure and fell out of the chair but did not hit his head.  Coworkers reported seizure lasting 10 minutes.  EMS states patient did not recall any of the event, and had a left arm drift and began to go into another grand mal seizure with EMS with face twisting" decorticate posturing" lasting 1.5 minutes.  Patient was given 5 mg of Versed by EMS and arrives in the emergency room sleepy.  Level 5 caveat applies as patient is unable to provide history at this time.  On chart review, patient was seen in the emergency room 3 days ago with complaint of chills with a temp of 100.1 with redness in his groin.  Patient had reported symptoms started on Saturday (5 days ago), but he had been bitten by a spider because he found a dead spider in his bed.  Patient had a CT scan showing soft tissue stranding but no drainable collection.  Was given Rocephin and discharged on doxycycline.  He was positive for gonorrhea, blood culture as of today shows no growth.      History reviewed. No pertinent past medical history.  There are no problems to display for this patient.   No past surgical history on file.     No family history on file.  Social History   Tobacco Use   Smoking status: Every Day    Types: Cigarettes   Smokeless tobacco: Never  Vaping Use   Vaping Use: Never used  Substance Use Topics   Alcohol use: Yes    Comment: occ   Drug use: Never    Home Medications Prior to Admission medications   Medication Sig Start Date End Date Taking?  Authorizing Provider  doxycycline (VIBRA-TABS) 100 MG tablet Take 1 tablet (100 mg total) by mouth 2 (two) times daily for 7 days. 03/25/21 04/01/21  Vanetta Mulders, MD    Allergies    Patient has no known allergies.  Review of Systems   Review of Systems  Unable to perform ROS: Mental status change   Physical Exam Updated Vital Signs BP (!) 113/58   Pulse 76   Temp 99 F (37.2 C) (Rectal)   Resp 17   Ht 5\' 11"  (1.803 m)   Wt 74.8 kg   SpO2 94%   BMI 23.01 kg/m   Physical Exam Vitals and nursing note reviewed.  Constitutional:      Appearance: He is ill-appearing.     Comments: Sleeping, rouses to tactile stimulus and is able to give short answers to simple questions by nodding.  Chills  HENT:     Head: Normocephalic and atraumatic.     Nose: Nose normal.     Mouth/Throat:     Mouth: Mucous membranes are moist.  Eyes:     Extraocular Movements: Extraocular movements intact.     Pupils: Pupils are equal, round, and reactive to light.  Cardiovascular:     Rate and Rhythm: Normal rate and regular rhythm.     Pulses: Normal pulses.  Heart sounds: Normal heart sounds.  Pulmonary:     Effort: Pulmonary effort is normal.     Breath sounds: Normal breath sounds.  Abdominal:     Palpations: Abdomen is soft.     Tenderness: There is no abdominal tenderness.  Musculoskeletal:     Cervical back: Neck supple. No rigidity.     Right lower leg: No edema.     Left lower leg: No edema.  Lymphadenopathy:     Lower Body: Left inguinal adenopathy present.  Skin:    General: Skin is warm and dry.     Findings: Erythema present.     Comments: Large area of erythema to left groin area and left lower abdomen, palpable Lns, no abscess. Does not involve scrotum or perineum.       ED Results / Procedures / Treatments   Labs (all labs ordered are listed, but only abnormal results are displayed) Labs Reviewed  I-STAT CHEM 8, ED - Abnormal; Notable for the following  components:      Result Value   Glucose, Bld 102 (*)    Calcium, Ion 1.12 (*)    Hemoglobin 11.6 (*)    HCT 34.0 (*)    All other components within normal limits  URINE CULTURE  CULTURE, BLOOD (ROUTINE X 2)  CULTURE, BLOOD (ROUTINE X 2)  LACTIC ACID, PLASMA  LACTIC ACID, PLASMA  COMPREHENSIVE METABOLIC PANEL  CBC WITH DIFFERENTIAL/PLATELET  PROTIME-INR  APTT  URINALYSIS, ROUTINE W REFLEX MICROSCOPIC  RAPID URINE DRUG SCREEN, HOSP PERFORMED  ETHANOL    EKG None  Radiology No results found.  Procedures Procedures   Medications Ordered in ED Medications  LORazepam (ATIVAN) injection 1 mg (has no administration in time range)  sodium chloride 0.9 % bolus 1,000 mL (1,000 mLs Intravenous New Bag/Given 03/28/21 1503)  iohexol (OMNIPAQUE) 300 MG/ML solution 100 mL (100 mLs Intravenous Contrast Given 03/28/21 1539)    ED Course  I have reviewed the triage vital signs and the nursing notes.  Pertinent labs & imaging results that were available during my care of the patient were reviewed by me and considered in my medical decision making (see chart for details).  Clinical Course as of 03/28/21 1540  Thu Mar 28, 2021  [id number] 35 year old male brought in by EMS with report of seizure, seizure also witnessed by EMS.  Arrives to the emergency room sleepy after having been given Versed.  Nods no when asked if he is ever had a seizure before.  Is able to raise his gown and show me the redness in his left groin area.  Chart review as above, compliance with doxycycline after his ER visit 3 days ago.  Review of prior chart, blood culture negative for growth to date, was positive for gonorrhea, CT with soft tissue stranding no drainable collection.  On exam, has large area of erythema to the left groin and lower abdomen, not extending to perineum or scrotum.  No nuchal rigidity, no fever on rectal temp check (99). Vitals otherwise unremarkable including O2 sat on room air of 100%.  CT  head ordered due to report of first-time seizure.  Also CT abdomen pelvis to evaluate for infection in his left lower abdominal area.  Labs pending at time of signout.  Patient was given IV fluids with order set for undifferentiated of concern for sepsis.  Care signed out to Dr. Rhunette Croft, ER attending at change of shift.  [LM]    Clinical Course User Index [LM] Jeannie Fend,  PA-C   MDM Rules/Calculators/A&P                           Final Clinical Impression(s) / ED Diagnoses Final diagnoses:  None    Rx / DC Orders ED Discharge Orders     None        Jeannie Fend, PA-C 03/28/21 1540    Derwood Kaplan, MD 03/28/21 1844

## 2021-03-29 ENCOUNTER — Observation Stay (HOSPITAL_COMMUNITY): Payer: 59

## 2021-03-29 DIAGNOSIS — R001 Bradycardia, unspecified: Secondary | ICD-10-CM

## 2021-03-29 DIAGNOSIS — R569 Unspecified convulsions: Secondary | ICD-10-CM | POA: Diagnosis present

## 2021-03-29 DIAGNOSIS — E876 Hypokalemia: Secondary | ICD-10-CM | POA: Diagnosis present

## 2021-03-29 DIAGNOSIS — G8221 Paraplegia, complete: Secondary | ICD-10-CM | POA: Diagnosis present

## 2021-03-29 DIAGNOSIS — R008 Other abnormalities of heart beat: Secondary | ICD-10-CM | POA: Diagnosis not present

## 2021-03-29 DIAGNOSIS — R3915 Urgency of urination: Secondary | ICD-10-CM | POA: Diagnosis present

## 2021-03-29 DIAGNOSIS — G0491 Myelitis, unspecified: Secondary | ICD-10-CM | POA: Diagnosis not present

## 2021-03-29 DIAGNOSIS — G40409 Other generalized epilepsy and epileptic syndromes, not intractable, without status epilepticus: Secondary | ICD-10-CM | POA: Diagnosis present

## 2021-03-29 DIAGNOSIS — K5900 Constipation, unspecified: Secondary | ICD-10-CM | POA: Diagnosis not present

## 2021-03-29 DIAGNOSIS — Z20822 Contact with and (suspected) exposure to covid-19: Secondary | ICD-10-CM | POA: Diagnosis present

## 2021-03-29 DIAGNOSIS — R799 Abnormal finding of blood chemistry, unspecified: Secondary | ICD-10-CM | POA: Diagnosis not present

## 2021-03-29 DIAGNOSIS — G479 Sleep disorder, unspecified: Secondary | ICD-10-CM | POA: Diagnosis not present

## 2021-03-29 DIAGNOSIS — G0481 Other encephalitis and encephalomyelitis: Secondary | ICD-10-CM | POA: Diagnosis present

## 2021-03-29 DIAGNOSIS — M792 Neuralgia and neuritis, unspecified: Secondary | ICD-10-CM | POA: Diagnosis not present

## 2021-03-29 DIAGNOSIS — Z79899 Other long term (current) drug therapy: Secondary | ICD-10-CM | POA: Diagnosis not present

## 2021-03-29 DIAGNOSIS — T380X5A Adverse effect of glucocorticoids and synthetic analogues, initial encounter: Secondary | ICD-10-CM | POA: Diagnosis present

## 2021-03-29 DIAGNOSIS — E871 Hypo-osmolality and hyponatremia: Secondary | ICD-10-CM | POA: Diagnosis not present

## 2021-03-29 DIAGNOSIS — F17210 Nicotine dependence, cigarettes, uncomplicated: Secondary | ICD-10-CM | POA: Diagnosis present

## 2021-03-29 DIAGNOSIS — A5409 Other gonococcal infection of lower genitourinary tract: Secondary | ICD-10-CM | POA: Diagnosis present

## 2021-03-29 DIAGNOSIS — L03311 Cellulitis of abdominal wall: Secondary | ICD-10-CM | POA: Diagnosis present

## 2021-03-29 DIAGNOSIS — G0490 Encephalitis and encephalomyelitis, unspecified: Secondary | ICD-10-CM | POA: Diagnosis not present

## 2021-03-29 DIAGNOSIS — E872 Acidosis, unspecified: Secondary | ICD-10-CM | POA: Diagnosis present

## 2021-03-29 DIAGNOSIS — T148XXA Other injury of unspecified body region, initial encounter: Secondary | ICD-10-CM | POA: Diagnosis not present

## 2021-03-29 DIAGNOSIS — N319 Neuromuscular dysfunction of bladder, unspecified: Secondary | ICD-10-CM | POA: Diagnosis present

## 2021-03-29 DIAGNOSIS — R339 Retention of urine, unspecified: Secondary | ICD-10-CM | POA: Diagnosis present

## 2021-03-29 DIAGNOSIS — K592 Neurogenic bowel, not elsewhere classified: Secondary | ICD-10-CM | POA: Diagnosis present

## 2021-03-29 DIAGNOSIS — L03314 Cellulitis of groin: Secondary | ICD-10-CM | POA: Diagnosis present

## 2021-03-29 LAB — CSF CELL COUNT WITH DIFFERENTIAL
Eosinophils, CSF: 2 % — ABNORMAL HIGH (ref 0–1)
Lymphs, CSF: 64 % (ref 40–80)
Monocyte-Macrophage-Spinal Fluid: 13 % — ABNORMAL LOW (ref 15–45)
RBC Count, CSF: 11 /mm3 — ABNORMAL HIGH
Segmented Neutrophils-CSF: 21 % — ABNORMAL HIGH (ref 0–6)
Tube #: 3
WBC, CSF: 104 /mm3 (ref 0–5)

## 2021-03-29 LAB — BLOOD CULTURE ID PANEL (REFLEXED) - BCID2

## 2021-03-29 LAB — URINE CULTURE: Culture: NO GROWTH

## 2021-03-29 LAB — BASIC METABOLIC PANEL
Anion gap: 8 (ref 5–15)
BUN: 8 mg/dL (ref 6–20)
CO2: 21 mmol/L — ABNORMAL LOW (ref 22–32)
Calcium: 8.4 mg/dL — ABNORMAL LOW (ref 8.9–10.3)
Chloride: 109 mmol/L (ref 98–111)
Creatinine, Ser: 1 mg/dL (ref 0.61–1.24)
GFR, Estimated: >60 mL/min (ref >60)
Glucose, Bld: 100 mg/dL — ABNORMAL HIGH (ref 70–99)
Potassium: 3.4 mmol/L — ABNORMAL LOW (ref 3.5–5.1)
Sodium: 138 mmol/L (ref 135–145)

## 2021-03-29 LAB — RESP PANEL BY RT-PCR (FLU A&B, COVID) ARPGX2
Influenza A by PCR: NEGATIVE
Influenza B by PCR: NEGATIVE
SARS Coronavirus 2 by RT PCR: NEGATIVE

## 2021-03-29 LAB — HEPATITIS PANEL, ACUTE
HCV Ab: NONREACTIVE
Hep A IgM: NONREACTIVE
Hep B C IgM: NONREACTIVE
Hepatitis B Surface Ag: NONREACTIVE

## 2021-03-29 LAB — CBC
HCT: 34.8 % — ABNORMAL LOW (ref 39.0–52.0)
Hemoglobin: 11.2 g/dL — ABNORMAL LOW (ref 13.0–17.0)
MCH: 30.3 pg (ref 26.0–34.0)
MCHC: 32.2 g/dL (ref 30.0–36.0)
MCV: 94.1 fL (ref 80.0–100.0)
Platelets: 222 10*3/uL (ref 150–400)
RBC: 3.7 MIL/uL — ABNORMAL LOW (ref 4.22–5.81)
RDW: 13.2 % (ref 11.5–15.5)
WBC: 10.1 10*3/uL (ref 4.0–10.5)
nRBC: 0 % (ref 0.0–0.2)

## 2021-03-29 LAB — PROTEIN AND GLUCOSE, CSF
Glucose, CSF: 55 mg/dL (ref 40–70)
Total  Protein, CSF: 109 mg/dL — ABNORMAL HIGH (ref 15–45)

## 2021-03-29 LAB — CRYPTOCOCCAL ANTIGEN: Crypto Ag: NEGATIVE

## 2021-03-29 LAB — SEDIMENTATION RATE: Sed Rate: 9 mm/hr (ref 0–16)

## 2021-03-29 LAB — C-REACTIVE PROTEIN: CRP: 0.8 mg/dL (ref <1.0)

## 2021-03-29 MED ORDER — CHLORHEXIDINE GLUCONATE CLOTH 2 % EX PADS
6.0000 | MEDICATED_PAD | Freq: Every day | CUTANEOUS | Status: DC
  Administered 2021-03-29 – 2021-04-05 (×7): 6 via TOPICAL

## 2021-03-29 MED ORDER — SODIUM CHLORIDE 0.9 % IV SOLN
2.0000 g | INTRAVENOUS | Status: DC
  Administered 2021-03-29 – 2021-03-31 (×10): 2 g via INTRAVENOUS
  Filled 2021-03-29 (×13): qty 2000

## 2021-03-29 MED ORDER — CEFTRIAXONE SODIUM 1 G IJ SOLR
1.0000 g | INTRAMUSCULAR | Status: DC
  Administered 2021-03-29: 1 g via INTRAVENOUS
  Filled 2021-03-29: qty 10

## 2021-03-29 MED ORDER — SODIUM CHLORIDE 0.9 % IV SOLN
INTRAVENOUS | Status: DC
Start: 2021-03-29 — End: 2021-04-02

## 2021-03-29 MED ORDER — ATROPINE SULFATE 1 MG/10ML IJ SOSY: 1.0000 mg | PREFILLED_SYRINGE | Freq: Once | INTRAMUSCULAR | Status: DC

## 2021-03-29 MED ORDER — SODIUM CHLORIDE 0.9 % IV SOLN
2.0000 g | Freq: Two times a day (BID) | INTRAVENOUS | Status: DC
  Administered 2021-03-30 – 2021-04-01 (×5): 2 g via INTRAVENOUS
  Filled 2021-03-29 (×5): qty 20

## 2021-03-29 MED ORDER — POTASSIUM CHLORIDE CRYS ER 20 MEQ PO TBCR
40.0000 meq | EXTENDED_RELEASE_TABLET | Freq: Once | ORAL | Status: AC
  Administered 2021-03-29: 40 meq via ORAL
  Filled 2021-03-29: qty 2

## 2021-03-29 MED ORDER — TAMSULOSIN HCL 0.4 MG PO CAPS
0.4000 mg | ORAL_CAPSULE | Freq: Every day | ORAL | Status: DC
  Administered 2021-03-29 – 2021-04-06 (×9): 0.4 mg via ORAL
  Filled 2021-03-29 (×9): qty 1

## 2021-03-29 MED ORDER — IBUPROFEN 200 MG PO TABS
600.0000 mg | ORAL_TABLET | Freq: Four times a day (QID) | ORAL | Status: DC | PRN
  Administered 2021-03-29 – 2021-04-06 (×2): 600 mg via ORAL
  Filled 2021-03-29: qty 1
  Filled 2021-03-29: qty 3

## 2021-03-29 MED ORDER — DEXTROSE 5 % IV SOLN
750.0000 mg | Freq: Three times a day (TID) | INTRAVENOUS | Status: DC
  Administered 2021-03-29 – 2021-04-02 (×12): 750 mg via INTRAVENOUS
  Filled 2021-03-29 (×14): qty 15

## 2021-03-29 MED ORDER — VANCOMYCIN HCL 1750 MG/350ML IV SOLN
1750.0000 mg | Freq: Once | INTRAVENOUS | Status: AC
  Administered 2021-03-29: 1750 mg via INTRAVENOUS
  Filled 2021-03-29: qty 350

## 2021-03-29 MED ORDER — ATROPINE SULFATE 1 MG/10ML IJ SOSY
PREFILLED_SYRINGE | INTRAMUSCULAR | Status: AC
  Administered 2021-03-29: 0.5 mg via INTRAVENOUS
  Filled 2021-03-29: qty 10

## 2021-03-29 MED ORDER — GADOBUTROL 1 MMOL/ML IV SOLN
7.0000 mL | Freq: Once | INTRAVENOUS | Status: AC | PRN
  Administered 2021-03-29: 7 mL via INTRAVENOUS

## 2021-03-29 MED ORDER — VANCOMYCIN HCL IN DEXTROSE 1-5 GM/200ML-% IV SOLN
1000.0000 mg | Freq: Three times a day (TID) | INTRAVENOUS | Status: DC
  Administered 2021-03-30 – 2021-03-31 (×4): 1000 mg via INTRAVENOUS
  Filled 2021-03-29 (×6): qty 200

## 2021-03-29 MED ORDER — ATROPINE SULFATE 1 MG/10ML IJ SOSY: 0.5000 mg | PREFILLED_SYRINGE | Freq: Once | INTRAMUSCULAR | Status: AC

## 2021-03-29 NOTE — ED Notes (Signed)
Pt to MRI

## 2021-03-29 NOTE — ED Notes (Addendum)
Pt has attempted to urinate x3. Unsuccessful. Pt bladder scan prior to foley insertion indicted 1000 ml.

## 2021-03-29 NOTE — Progress Notes (Signed)
Pharmacy Antibiotic Note  James Obrien is a 35 y.o. male admitted on 03/28/2021 with meningitis.  Pharmacy has been consulted for Vancomycin and Ampicillin dosing. Acyclovir also per pharmacy. Ceftriaxone per MD.  Plan: Vancomycin 1750 mg IV x1 then 1000mg  IV every 8 hours.  Goal trough 15-20 mcg/mL. Ampicillin 2g IV every 4 hours.  Acyclovir 750 mg IV every 8 hours. Ceftriaxone 2g IV every 12 hours.  Monitor renal function and culture results.   Height: 5\' 11"  (180.3 cm) Weight: 74.8 kg (165 lb) IBW/kg (Calculated) : 75.3  Temp (24hrs), Avg:100.6 F (38.1 C), Min:99 F (37.2 C), Max:103.1 F (39.5 C)  Recent Labs  Lab 03/25/21 1705 03/28/21 1444 03/28/21 1525 03/28/21 1908 03/29/21 0438  WBC 7.2 10.8*  --   --  10.1  CREATININE 0.92 1.27* 1.10  --  1.00  LATICACIDVEN 0.9 4.1*  --  1.0  --     Estimated Creatinine Clearance: 111.2 mL/min (by C-G formula based on SCr of 1 mg/dL).    No Known Allergies  Antimicrobials this admission: Ceftriaxone 9/30 >> Acyclovir 9/30 >> Vancomycin 9/30 >> Ampicillin 9/30 >>  Dose adjustments this admission:   Microbiology results: 9/30 CSF >> 9/30 BCx 2/4 GPC clusters (BCID Staph epi)  Thank you for allowing pharmacy to be a part of this patient's care.  10/30, PharmD, BCPS, BCCCP Clinical Pharmacist Please refer to [facility] for [facility] Pharmacy numbers 03/29/2021 9:15 PM

## 2021-03-29 NOTE — Consult Note (Signed)
[facility]    Date of Admission:  03/28/2021     Total days of antibiotics                Reason for Consult: Rash    Referring Provider: Dr. Jonathon Bellows Primary Care Provider: Pcp, No   ASSESSMENT:  [name] is a 35 y/o male admitted with 2 episodes of tonic-clonic seizures and improving groin rash/cellulitis with recent diagnosis and treatment for Gonorrhea. Unclear origin of symptoms with concern for HSV. There are no lesions noted that would be concerning for Bechets at least at this point. EEG completed with results pending. Neurology performing LP. Agree with acyclovir and ceftriaxone in the interim. Unclear if new onset seizures and rash are related but certainly cannot be excluded. Blood culture with Staphylococcus epidermidis in 1 out of 4 bottles consistent with contaminant. No current indication to  add additional antibiotics at this time. Continue supportive care per primary team. ID will continue to follow.   PLAN:  Continue acyclovir and ceftriaxone LP per neurology. Await results of lab work and narrowing antibiotics as appropriate. Remaining care per primary team.  ID will continue to follow   Principal Problem:   Seizure (HCC) Active Problems:   Gonorrhea   Cellulitis    enoxaparin (LOVENOX) injection  40 mg Subcutaneous Q24H   levETIRAcetam  500 mg Oral BID   tamsulosin  0.4 mg Oral Daily     HPI: James Obrien is a 35 y.o. male with no significant medical history presenting to the ED with seizures.   [name] was in his usual state of health when he began having an episode of numbness and tingling in his left hand. Over the course of an hour had increased episodes of tingling, shaking, twitching, weakness and loss of control of his left hand. He was witnessed to have a generalized tonic-clonic seizure that last between 2-5 minutes. He had a secondary seizure enroute to the hospital with EMS which was treated with 5 mg of  Versed. On arrival he had a mild headache with residual left sided weakness. No prior personal or family history of seizures. There was no trauma or injury.   [name] was seen on 03/25/21 at the [facility] ED with erythema and lesions to his suprpubic / midgroin areas as well on his penis. May have been a spider bite as he found a dead spider in his bed. During this visit he had elevated tempearture of 100.1. On exam noted to have urethral purulent discharge with rash and small nonvesicular skin lesions. Found to be positive for Gonorrhea and was treated with 500 mg of ceftriaxone and doxycycline. Chlamydia and Syphilis were negative. Blood cultures drawn have remained without growth to date.   Febrile in the last 24 hours with max temperature of 102.5 F with WBC count of 10.1. CT abdomen/pelvis with no acute findings and improved left groin inflammatory process/cellulitis. CT head with no acute intracranial abnormalities. Chest x-ray borderline cardiomegaly or airspace disease. MRI brain without contrast concerning for acute demyelination or possibly lymphoma. MRI brain with contrast with faint enhancement concerning for inflammatory/demyelinating process Neurology considering LP. Blood cultures on arrival are positive for Methicillin sensitive Staphylococcus epidermidis. Started on acyclovir and ceftriaxone.   Review of Systems: Review of Systems  Unable to perform ROS: Acuity of condition    Past Medical History:  Diagnosis Date   Medical history non-contributory     Social History  Tobacco Use   Smoking status: Every Day    Types: Cigarettes   Smokeless tobacco: Never  Vaping Use   Vaping Use: Never used  Substance Use Topics   Alcohol use: Yes    Comment: occ   Drug use: Never    Family History  Problem Relation Age of Onset   Seizures Neg Hx     No Known Allergies  OBJECTIVE: Blood pressure (!) 135/58, pulse (!) 54, temperature 99.1 F (37.3 C), temperature  source Oral, resp. rate (!) 22, height 5\' 11"  (1.803 m), weight 74.8 kg, SpO2 100 %.  Physical Exam Constitutional:      General: He is not in acute distress.    Appearance: He is well-developed. He is ill-appearing.  Cardiovascular:     Rate and Rhythm: Normal rate and regular rhythm.     Heart sounds: Normal heart sounds.  Pulmonary:     Effort: Pulmonary effort is normal.     Breath sounds: Normal breath sounds.  Skin:    General: Skin is warm and dry.     Comments: Left groin has erythremic rash with no distinct lesions.   Neurological:     Mental Status: He is oriented to person, place, and time. He is lethargic.  Psychiatric:        Behavior: Behavior normal.        Thought Content: Thought content normal.        Judgment: Judgment normal.    Lab Results Lab Results  Component Value Date   WBC 10.1 03/29/2021   HGB 11.2 (L) 03/29/2021   HCT 34.8 (L) 03/29/2021   MCV 94.1 03/29/2021   PLT 222 03/29/2021    Lab Results  Component Value Date   CREATININE 1.00 03/29/2021   BUN 8 03/29/2021   NA 138 03/29/2021   K 3.4 (L) 03/29/2021   CL 109 03/29/2021   CO2 21 (L) 03/29/2021    Lab Results  Component Value Date   ALT 25 03/28/2021   AST 36 03/28/2021   ALKPHOS 39 03/28/2021   BILITOT 0.4 03/28/2021     Microbiology: Recent Results (from the past 240 hour(s))  Culture, blood (Routine X 2) w Reflex to ID Panel     Status: None (Preliminary result)   Collection Time: 03/25/21  5:04 PM   Specimen: BLOOD  Result Value Ref Range Status   Specimen Description   Final    BLOOD RIGHT ANTECUBITAL Performed at [facility], [address] [place], [place]    Special Requests   Final    BOTTLES DRAWN AEROBIC AND ANAEROBIC Blood Culture adequate volume Performed at [facility], [address] [place], [place]    Culture   Final    NO GROWTH 4 DAYS Performed at [facility], [address], [place], [place]     Report Status PENDING  Incomplete  Culture, blood (Routine X 2) w Reflex to ID Panel     Status: None (Preliminary result)   Collection Time: 03/25/21  5:12 PM   Specimen: BLOOD  Result Value Ref Range Status   Specimen Description   Final    BLOOD LEFT ANTECUBITAL Performed at [facility], [address] [place], [place]    Special Requests   Final    BOTTLES DRAWN AEROBIC AND ANAEROBIC Blood Culture adequate volume Performed at [facility], [address] [place], [place]  Culture   Final    NO GROWTH 4 DAYS Performed at [facility], [address], [place], [place] [address]    Report Status PENDING  Incomplete  Blood Culture (routine x 2)     Status: None (Preliminary result)   Collection Time: 03/28/21  2:45 PM   Specimen: BLOOD  Result Value Ref Range Status   Specimen Description BLOOD RIGHT UPPER ARM  Final   Special Requests   Final    BOTTLES DRAWN AEROBIC AND ANAEROBIC Blood Culture results may not be optimal due to an excessive volume of blood received in culture bottles   Culture   Final    NO GROWTH < 24 HOURS Performed at [facility], [address], [place], [place] [address]    Report Status PENDING  Incomplete  Blood Culture (routine x 2)     Status: None (Preliminary result)   Collection Time: 03/28/21  2:50 PM   Specimen: BLOOD  Result Value Ref Range Status   Specimen Description BLOOD LEFT ANTECUBITAL  Final   Special Requests   Final    BOTTLES DRAWN AEROBIC AND ANAEROBIC Blood Culture results may not be optimal due to an excessive volume of blood received in culture bottles   Culture  Setup Time   Final    GRAM POSITIVE COCCI IN CLUSTERS ANAEROBIC BOTTLE ONLY CRITICAL RESULT CALLED TO, READ BACK BY AND VERIFIED WITH: PHARMD E.SINCLAIR AT 1255 ON 03/29/2021 BY T.SAAD. Performed at [facility], [address], [place], [place] [address]    Culture GRAM POSITIVE COCCI IN CLUSTERS  Final    Report Status PENDING  Incomplete  Blood Culture ID Panel (Reflexed)     Status: Abnormal   Collection Time: 03/28/21  2:50 PM  Result Value Ref Range Status   Enterococcus faecalis NOT DETECTED NOT DETECTED Final   Enterococcus Faecium NOT DETECTED NOT DETECTED Final   Listeria monocytogenes NOT DETECTED NOT DETECTED Final   Staphylococcus species DETECTED (A) NOT DETECTED Final    Comment: CRITICAL RESULT CALLED TO, READ BACK BY AND VERIFIED WITH: PHARMD E.SINCLAIR AT 1255 ON 03/29/2021 BY T.SAAD.    Staphylococcus aureus (BCID) NOT DETECTED NOT DETECTED Final   Staphylococcus epidermidis DETECTED (A) NOT DETECTED Final    Comment: CRITICAL RESULT CALLED TO, READ BACK BY AND VERIFIED WITH: PHARMD E.SINCLAIR AT 1255 ON 03/29/2021 BY T.SAAD.    Staphylococcus lugdunensis NOT DETECTED NOT DETECTED Final   Streptococcus species NOT DETECTED NOT DETECTED Final   Streptococcus agalactiae NOT DETECTED NOT DETECTED Final   Streptococcus pneumoniae NOT DETECTED NOT DETECTED Final   Streptococcus pyogenes NOT DETECTED NOT DETECTED Final   A.calcoaceticus-baumannii NOT DETECTED NOT DETECTED Final   Bacteroides fragilis NOT DETECTED NOT DETECTED Final   Enterobacterales NOT DETECTED NOT DETECTED Final   Enterobacter cloacae complex NOT DETECTED NOT DETECTED Final   Escherichia coli NOT DETECTED NOT DETECTED Final   Klebsiella aerogenes NOT DETECTED NOT DETECTED Final   Klebsiella oxytoca NOT DETECTED NOT DETECTED Final   Klebsiella pneumoniae NOT DETECTED NOT DETECTED Final   Proteus species NOT DETECTED NOT DETECTED Final   Salmonella species NOT DETECTED NOT DETECTED Final   Serratia marcescens NOT DETECTED NOT DETECTED Final   Haemophilus influenzae NOT DETECTED NOT DETECTED Final   Neisseria meningitidis NOT DETECTED NOT DETECTED Final   Pseudomonas aeruginosa NOT DETECTED NOT DETECTED Final   Stenotrophomonas maltophilia NOT DETECTED NOT DETECTED Final   Candida albicans NOT  DETECTED NOT  DETECTED Final   Candida auris NOT DETECTED NOT DETECTED Final   Candida glabrata NOT DETECTED NOT DETECTED Final   Candida krusei NOT DETECTED NOT DETECTED Final   Candida parapsilosis NOT DETECTED NOT DETECTED Final   Candida tropicalis NOT DETECTED NOT DETECTED Final   Cryptococcus neoformans/gattii NOT DETECTED NOT DETECTED Final   Methicillin resistance mecA/C NOT DETECTED NOT DETECTED Final    Comment: Performed at [facility], [address], [place], [place] [address]     Marcos Eke, NP [facility]  03/29/2021  1:15 PM

## 2021-03-29 NOTE — ED Notes (Signed)
MD notified of pt's temp of 103.1 

## 2021-03-29 NOTE — ED Notes (Addendum)
MD Chotiner paged that pt is bradycardic - 37-38bpm - pt is sleeping at this time

## 2021-03-29 NOTE — Progress Notes (Signed)
PHARMACY - PHYSICIAN COMMUNICATION CRITICAL VALUE ALERT - BLOOD CULTURE IDENTIFICATION (BCID)  James Obrien is an 35 y.o. male who presented to [facility] on 03/28/2021 with a chief complaint of new onset seizures.   Assessment:  35 year old male admitted with new onset seizures. Noted to have a rash near his groin with concern for herpes related encephalitis. Now with 1/4 blood cultures with methicillin sensitive staph epi.   Name of physician (or Provider) Contacted: Lanae Boast Comer (ID consulting)  Current antibiotics: Ceftriaxone/acyclovir   Changes to prescribed antibiotics recommended:  None based on blood cultures   Results for orders placed or performed during the hospital encounter of 03/28/21  Blood Culture ID Panel (Reflexed) (Collected: 03/28/2021  2:50 PM)  Result Value Ref Range   Enterococcus faecalis NOT DETECTED NOT DETECTED   Enterococcus Faecium NOT DETECTED NOT DETECTED   Listeria monocytogenes NOT DETECTED NOT DETECTED   Staphylococcus species DETECTED (A) NOT DETECTED   Staphylococcus aureus (BCID) NOT DETECTED NOT DETECTED   Staphylococcus epidermidis DETECTED (A) NOT DETECTED   Staphylococcus lugdunensis NOT DETECTED NOT DETECTED   Streptococcus species NOT DETECTED NOT DETECTED   Streptococcus agalactiae NOT DETECTED NOT DETECTED   Streptococcus pneumoniae NOT DETECTED NOT DETECTED   Streptococcus pyogenes NOT DETECTED NOT DETECTED   A.calcoaceticus-baumannii NOT DETECTED NOT DETECTED   Bacteroides fragilis NOT DETECTED NOT DETECTED   Enterobacterales NOT DETECTED NOT DETECTED   Enterobacter cloacae complex NOT DETECTED NOT DETECTED   Escherichia coli NOT DETECTED NOT DETECTED   Klebsiella aerogenes NOT DETECTED NOT DETECTED   Klebsiella oxytoca NOT DETECTED NOT DETECTED   Klebsiella pneumoniae NOT DETECTED NOT DETECTED   Proteus species NOT DETECTED NOT DETECTED   Salmonella species NOT DETECTED NOT DETECTED   Serratia marcescens NOT DETECTED NOT  DETECTED   Haemophilus influenzae NOT DETECTED NOT DETECTED   Neisseria meningitidis NOT DETECTED NOT DETECTED   Pseudomonas aeruginosa NOT DETECTED NOT DETECTED   Stenotrophomonas maltophilia NOT DETECTED NOT DETECTED   Candida albicans NOT DETECTED NOT DETECTED   Candida auris NOT DETECTED NOT DETECTED   Candida glabrata NOT DETECTED NOT DETECTED   Candida krusei NOT DETECTED NOT DETECTED   Candida parapsilosis NOT DETECTED NOT DETECTED   Candida tropicalis NOT DETECTED NOT DETECTED   Cryptococcus neoformans/gattii NOT DETECTED NOT DETECTED   Methicillin resistance mecA/C NOT DETECTED NOT DETECTED    Sharin Mons, PharmD, BCPS, BCIDP Infectious Diseases Clinical Pharmacist Phone: [phone] 03/29/2021  12:56 PM

## 2021-03-29 NOTE — Progress Notes (Addendum)
Neurology Progress Note  S: Patient states he is cold, he is shivering in UEs, chest and neck (on first exam), not when seen later. Has had fever and received Tylenol. Last temp check 99, down from 100.27F prior. States his neck has been mildly stiff without pain. Denies vision changes or red eyes.  NP asked patient if NP could ask him personal question in presence of his wife, and he stated yes. Patient denies history of genital herpes.   Consult per Dr. Ezzie Dural last pm. Per RN, patient is having difficulty voiding. Bladder scan with urinary retention. Patient has been in and out cathed and plan is for Foley. Flomax was started in ED.    O: Current vital signs: BP 114/76   Pulse (!) 51   Temp 99.1 F (37.3 C) (Oral)   Resp (!) 21   Ht 5\' 11"  (1.803 m)   Wt 74.8 kg   SpO2 100%   BMI 23.01 kg/m  Vital signs in last 24 hours: Temp:  [99 F (37.2 C)-102.5 F (39.2 C)] 99.1 F (37.3 C) (09/30 0845) Pulse Rate:  [38-93] 51 (09/30 0645) Resp:  [14-26] 21 (09/30 1030) BP: (99-137)/(48-76) 114/76 (09/30 1030) SpO2:  [93 %-100 %] 100 % (09/30 1030) Weight:  [74.8 kg] 74.8 kg (09/29 1500)  GENERAL: Poor appearing young male. Shivering. Awake and alert.  HEENT: Normocephalic and atraumatic. LUNGS: Normal respiratory effort.  CV: bradycardic in 50s.  Ext: warm. No rash on BLEs, BUEs. No edema of swelling of joints.  Psych: quiet, reluctant to answer questions and participate in exam. Affect is strange. No conversation between he and his wife in room.   NEURO:  Mental Status: Alert and oriented to self, age, month, state. Knows he is in the ED but can not tell me which hospital. Disoriented to city, day, date.  Speech/Language: speech is without aphasia or dysarthria.  Naming is intact except pen-he just says I don't know what that is, even with coaching. He answers questions is one or short phrases.  Comprehension intact. Follows commands.   Cranial Nerves:  II: PERRL. Visual fields full.   III, IV, VI: EOMI. Eyelids elevate symmetrically when he will open them. No nystagmus.  V: Sensation is intact to light touch and symmetrical to face.  VII: Face is grossly symmetrical.  VIII: hearing intact to voice. IX, X: Palate elevates symmetrically. Phonation is normal.  06-25-1994 shrug 5/5. XII: tongue is midline without fasciculations. Motor:  RUE: grip 5     bicep  5     tricep  5 RLE: thigh 5    knee  5  plantar flexion  5  dorsiflexion  5 LUE: grip 5   bicep 5    tricep 5  LLE: thigh 5   knee 5   plantar flexion 5   dorsiflexion 5 Tone: is normal and bulk is normal. Sensation- Intact to light touch bilaterally.  Coordination: No drift. No clonus noted. No twitching, shaking, jerking, or tremor noted.  DTRs:  RUE:  brachioradialis 3      biceps 3 RLE:  patella 3 LUE:  brachioradialis  3  biceps 3 LLE:  patella 3 Gait- deferred.  Medications  Current Facility-Administered Medications:    0.9 %  sodium chloride infusion, , Intravenous, Continuous, [id number], MD, Last Rate: 75 mL/hr at 03/28/21 2357, New Bag at 03/28/21 2357   acetaminophen (TYLENOL) tablet 650 mg, 650 mg, Oral, Q6H PRN, 650 mg at 03/29/21 0004 **OR** acetaminophen (TYLENOL) suppository  650 mg, 650 mg, Rectal, Q6H PRN, Orland Mustard, MD   cefTRIAXone (ROCEPHIN) 1 g in sodium chloride 0.9 % 100 mL IVPB, 1 g, Intravenous, Q24H, Kc, Ramesh, MD   enoxaparin (LOVENOX) injection 40 mg, 40 mg, Subcutaneous, Q24H, Orland Mustard, MD, 40 mg at 03/28/21 2358   ibuprofen (ADVIL) tablet 600 mg, 600 mg, Oral, Q6H PRN, Chotiner, Claudean Severance, MD, 600 mg at 03/29/21 0145   levETIRAcetam (KEPPRA) tablet 500 mg, 500 mg, Oral, BID, Erick Blinks, MD, 500 mg at 03/29/21 8099   LORazepam (ATIVAN) injection 1 mg, 1 mg, Intravenous, Q4H PRN, Jeannie Fend, PA-C   tamsulosin [facility]) capsule 0.4 mg, 0.4 mg, Oral, Daily, Kc, Ramesh, MD, 0.4 mg at 03/29/21 8338  Current Outpatient Medications:    doxycycline  (VIBRA-TABS) 100 MG tablet, Take 1 tablet (100 mg total) by mouth 2 (two) times daily for 7 days., Disp: 14 tablet, Rfl: 0   TYLENOL 500 MG tablet, Take 500-1,000 mg by mouth every 6 (six) hours as needed (FOR HEADACHES)., Disp: , Rfl:   Pertinent Labs RPR neg. HIV NR. Gonorrhea +. Hepatitis panel neg.   Imaging  MRI Brain with contrast 03/28/21.  Faint enhancement is associated with the T2 hyperintense lesions, an inflammatory/demyelinating process is again favored. Given pontine involvement and chart history of genitourinary lesions, specifically consider Bechet's.    rEEG-This study is suggestive of cortical dysfunction arising from left hemisphere, nonspecific etiology but could be secondary to underlying structural abnormality. Additionally there is mild diffuse encephalopathy, nonspecific etiology. No seizures or epileptiform discharges were seen throughout the recording.   Assessment: 35 y.o. male with PMH significant for everyday smoker who presents with multiple focal seizures with retained awareness and 2 focal seizures with secondary generalization. No prior hx of seizures, no obvious seizure risk factors on history. Unclear at this time as to why he had seizures. Given his penile/scrotal lesions and + gonorrhea, this could be Herpes. ID has been consulted. Acyclovir started empirically for HSV. The differential is broad, including; HSV, primary seizure, Behcet's syndrome given genital lesions and inflammatory process on MRIb, vasculitis, meningitis, and viral encephalitis. No weakness or numbness on exam, so will hold off on spinal imaging.    Impression:  -Focal seizure x 2 with secondary generalization.  -Penile and scrotum lesions, spider bite vs. genital herpes.  -Fever.  -Suspicious for HSV.  -? Behcet's syndrome, although rare.   Recommendations/Plan:  -Trend fever curve.  -Monitor for focal seizures or GTC.  -Continue Keppra 500mg  po q12 hours.  -Seizure precautions.   -Ativan 2mg  prn for seizure lasting over 3 minutes and notify neurology.  -May want to check for Herpes Type II.  -Acyclovir empiric for HSV.  -Hold Lovenox in anticipation for LP (has been held for over 12 hours).  -Patient has already been educated on seizure safety and [place] driving laws.   +++Chatted hospitalist attending about lesions and suspicion for HSV. He is going to involve ID.   ++ID saw patient when NP was setting up LP. Patient's fever back up to 103F.   Pt seen by , MSN, APN-BC/Nurse Practitioner/Neuro and later by MD. Note and plan to be edited as needed by MD.  Pager: 

## 2021-03-29 NOTE — Progress Notes (Signed)
EEG complete - results pending 

## 2021-03-29 NOTE — ED Notes (Signed)
Walked patient to the bathroom patient did well patient is now back in bed on the monitor with family at bedside bladder scan patient 390 ml patient had

## 2021-03-29 NOTE — ED Notes (Signed)
MD Chotiner made aware of recheck temp

## 2021-03-29 NOTE — Consult Note (Signed)
Cardiology Consultation:   Patient ID: James Obrien MRN: [id number]; DOB: [birth year]  Admit date: 03/28/2021 Date of Consult: 03/29/2021  PCP:  James Obrien, No   [facility] Providers Cardiologist:  None        Patient Profile:   James Obrien is a 35 y.o. male who was admitted with tonic-clonic seizure activity and is  being seen 03/29/2021 for the evaluation of slow heart rate at the request of Dr. Trey Paula.  History of Present Illness:   James Obrien denies any known cardiac history.  He works in [employer] and is active on his job.  He denies any history of chest pain or shortness of breath.  He recently was treated for gonorrhea infection and also apparently had spider bites in his groin region.  He started smoking at age 64 and has been smoking for 17 years.  Today he began to experience seizure activity.  There was no prior history of seizures or childhood seizures.  He denied any recent alcohol use or recreational drugs, head injury or family history of seizures.  Apparently, in the emergency room, while sleeping the patient was noted to be bradycardic with heart rates in the 40s to upper 30s.  I do not have strips available for my review.  He was lethargic since admission apparently was given atropine 0.5 mg.  He just arrived to [address] and currently is not on the monitor which they are trying to fix.  Earlier today, he had undergone an LP with temperature of 103 as well as an EEG.  Recent blood pressure was 110/70 upon arrival and heart rate was 59.  We were asked to evaluate him because of these bradycardic episodes.  Upon further questioning the patient admits to snoring moderately at night.  He is unaware of any episodes where he awakens gasping for breath.  He denies any awareness of sleep apnea.  He is unaware of any nocturnal arrhythmia.   Past Medical History:  Diagnosis Date   Medical history non-contributory     Past Surgical History:  Procedure Laterality  Date   NO PAST SURGERIES         Inpatient Medications: Scheduled Meds:  Chlorhexidine Gluconate Cloth  6 each Topical Daily   levETIRAcetam  500 mg Oral BID   tamsulosin  0.4 mg Oral Daily   Continuous Infusions:  sodium chloride 125 mL/hr at 03/29/21 1815   acyclovir Stopped (03/29/21 1552)   cefTRIAXone (ROCEPHIN)  IV     PRN Meds: acetaminophen **OR** acetaminophen, ibuprofen, LORazepam  Allergies:   No Known Allergies  Social History:   Social History   Socioeconomic History   Marital status: Married    Spouse name: Not on file   Number of children: Not on file   Years of education: Not on file   Highest education level: Not on file  Occupational History   Not on file  Tobacco Use   Smoking status: Every Day    Types: Cigarettes   Smokeless tobacco: Never  Vaping Use   Vaping Use: Never used  Substance and Sexual Activity   Alcohol use: Yes    Comment: occ   Drug use: Never   Sexual activity: Not on file  Other Topics Concern   Not on file  Social History Narrative   Only one in family who works.   Social Determinants of Health   Financial Resource Strain: Not on file  Food Insecurity: Not on file  Transportation Needs: Not on  file  Physical Activity: Not on file  Stress: Not on file  Social Connections: Not on file  Intimate Partner Violence: Not on file    Socially he is married and has 3 children.  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  There is no family history for seizure activity or awareness of sleep apnea.  He has 1 sister who is alive and well  Family History  Problem Relation Age of Onset   Seizures Neg Hx      ROS:  Please see the history of present illness.   General: Positive fever HEENT: Negative; No changes in vision or hearing, sinus congestion, difficulty swallowing Pulmonary: Negative; No cough, wheezing, shortness of breath,  hemoptysis Cardiovascular: No chest pain, PND orthopnea.  No awareness of irregular heart rhythm GI: Negative; No nausea, vomiting, diarrhea, or abdominal pain GU: Negative; No dysuria, hematuria, or difficulty voiding Musculoskeletal: Negative; no myalgias, joint pain, or weakness Hematologic/Oncology: Negative; no easy bruising, bleeding Endocrine: Negative; no heat/cold intolerance; no diabetes Neuro: Negative; no changes in balance, headaches Skin: Negative; No rashes or skin lesions Psychiatric: Negative; No behavioral problems, depression Sleep: Positive for snoring, nohypersomnolence, bruxism, restless legs, hypnogognic hallucinations, no cataplexy Other comprehensive 14 point system review is negative.  All other ROS reviewed and negative.     Physical Exam/Data:   Vitals:   03/29/21 1600 03/29/21 1630 03/29/21 1700 03/29/21 1804  BP: 116/62 (!) 117/56 (!) 113/50 (!) 109/59  Pulse: (!) 58 (!) 52 (!) 53 (!) 54  Resp: 20 20 (!) 24   Temp:    99.3 F (37.4 C)  TempSrc:    Oral  SpO2: 100% 100% 99% 100%  Weight:      Height:        Intake/Output Summary (Last 24 hours) at 03/29/2021 1832 Last data filed at 03/29/2021 1340 Gross per 24 hour  Intake 1100 ml  Output 1200 ml  Net -100 ml   Last 3 Weights 03/28/2021 03/25/2021  Weight (lbs) 165 lb 152 lb  Weight (kg) 74.844 kg 68.947 kg     Body mass index is 23.01 kg/m.  General: Thin, no acute distress, but lethargic and shivering HEENT: normal Neck: no JVD Vascular: No carotid bruits; Distal pulses 2+ bilaterally Cardiac:  normal S1, S2; RRR with mild sinus arrhythmia.  I/6 systolic murmur.  No S3 or S4 gallop. Lungs:  clear to auscultation bilaterally, no wheezing, rhonchi or rales  Abd: soft, nontender, no hepatomegaly  Ext: no edema Musculoskeletal:  No deformities, BUE and BLE strength normal and equal Skin: warm and dry  Neuro:  no focal abnormalities noted   EKG:  The EKG was personally reviewed and  demonstrates: Normal sinus rhythm at 65 bpm with mild sinus arrhythmia.  Normal intervals.  No ST segment changes Telemetry:  Telemetry was personally reviewed and demonstrates: Sinus rhythm  Relevant CV Studies: We will schedule patient for 2D echo Doppler study in a.m.  Laboratory Data:  High Sensitivity Troponin:  No results for input(s): TROPONINIHS in the last 720 hours.   Chemistry Recent Labs  Lab 03/25/21 1705 03/28/21 1444 03/28/21 1525 03/28/21 1930 03/29/21 0438  NA 135 138 140  --  138  K 3.7 4.8 4.3  --  3.4*  CL 99 104 104  --  109  CO2 25 23  --   --  21*  GLUCOSE 98 103* 102*  --  100*  BUN 14 13 12   --  8  CREATININE 0.92 1.27* 1.10  --  1.00  CALCIUM 9.3 8.7*  --   --  8.4*  MG  --   --   --  2.3  --   GFRNONAA >60 >60  --   --  >60  ANIONGAP 11 11  --   --  8    Recent Labs  Lab 03/25/21 1705 03/28/21 1444  PROT 8.1 6.9  ALBUMIN 4.3 3.6  AST 35 36  ALT 31 25  ALKPHOS 45 39  BILITOT 0.7 0.4   Lipids No results for input(s): CHOL, TRIG, HDL, LABVLDL, LDLCALC, CHOLHDL in the last 168 hours.  Hematology Recent Labs  Lab 03/25/21 1705 03/28/21 1444 03/28/21 1525 03/29/21 0438  WBC 7.2 10.8*  --  10.1  RBC 4.63 4.22  --  3.70*  HGB 14.3 12.9* 11.6* 11.2*  HCT 41.1 39.2 34.0* 34.8*  MCV 88.8 92.9  --  94.1  MCH 30.9 30.6  --  30.3  MCHC 34.8 32.9  --  32.2  RDW 12.9 13.2  --  13.2  PLT 181 214  --  222   Thyroid No results for input(s): TSH, FREET4 in the last 168 hours.  BNPNo results for input(s): BNP, PROBNP in the last 168 hours.  DDimer No results for input(s): DDIMER in the last 168 hours.   Radiology/Studies:  DG Chest 1 View  Addendum Date: 03/28/2021   ADDENDUM REPORT: 03/28/2021 15:57 Electronically Signed   By: 03/30/2021 M.D.   On: 03/28/2021 15:57   Result Date: 03/28/2021 CLINICAL DATA:  Seizure and fell out of chair EXAM: CHEST  1 VIEW COMPARISON:  None. FINDINGS: No focal opacity or pleural effusion. Borderline  cardiomegaly. No pneumothorax. IMPRESSION: Borderline cardiomegaly without edema or focal airspace disease. Electronically Signed: By: 03/30/2021 M.D. On: 03/28/2021 15:55   CT Head Wo Contrast  Result Date: 03/28/2021 CLINICAL DATA:  Seizure. EXAM: CT HEAD WITHOUT CONTRAST TECHNIQUE: Contiguous axial images were obtained from the base of the skull through the vertex without intravenous contrast. COMPARISON:  None. FINDINGS: Brain: No evidence of acute infarction, hemorrhage, hydrocephalus, extra-axial collection or mass lesion/mass effect. Vascular: No hyperdense vessel or unexpected calcification. Skull: Normal. Negative for fracture or focal lesion. Sinuses/Orbits: Air-fluid level noted in the left maxillary sinus. Other: None. IMPRESSION: 1. No acute intracranial abnormalities. 2. Air-fluid level within the left maxillary sinus. Electronically Signed   By: 03/30/2021 M.D.   On: 03/28/2021 15:49   MR BRAIN WO CONTRAST  Result Date: 03/28/2021 CLINICAL DATA:  Seizure EXAM: MRI HEAD WITHOUT CONTRAST TECHNIQUE: Multiplanar, multiecho pulse sequences of the brain and surrounding structures were obtained without intravenous contrast. COMPARISON:  None. FINDINGS: Brain: There is no acute hemorrhage or acute infarct. There is an area of irregular hyperintense T2-weighted signal that involves the precentral and postcentral gyri. There are other scattered foci of hyperintense T2-weighted signal, many of which have an abnormal, rounded morphology. Vascular: Major flow voids are preserved. Skull and upper cervical spine: Normal calvarium and skull base. Visualized upper cervical spine and soft tissues are normal. Sinuses/Orbits:Mucosal thickening in the maxillary sinuses. No mastoid or middle ear effusion. Normal orbits. IMPRESSION: 1. Multifocal abnormal hyperintense T2-weighted signal within both hemispheres, but greatest at the right pre and postcentral gyri. The appearance is concerning for acute  demyelination. Lymphoma is a secondary possibility. Postcontrast imaging is recommended as an adjunct to this study, for further evaluation. Electronically Signed  By: Deatra Robinson M.D.   On: 03/28/2021 22:54   MR BRAIN W CONTRAST  Result Date: 03/29/2021 CLINICAL DATA:  Brain mass or lesion EXAM: MRI HEAD WITH CONTRAST TECHNIQUE: Multiplanar, multiecho pulse sequences of the brain and surrounding structures were obtained with intravenous contrast. CONTRAST:  72mL GADAVIST GADOBUTROL 1 MMOL/ML IV SOLN COMPARISON:  Noncontrast brain MRI from earlier today FINDINGS: Motion degraded study. Some of the patient's lesions show faint amorphous enhancement, including in the right frontal parietal cortex and juxtacortical region, the left occipital cortex, in indistinctly in the pons. Major vessels are enhancing. 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 Negative orbits and skull. There is chart history of lesions involving the genitals. Neuro Bechet's is a leading consideration especially given the pontine involvement. Other inflammatory processes such as ADEM or sarcoid are considered. An infiltrating neoplasm such as lymphoma is a consideration, but usually much more avidly enhancing. Need follow-up. IMPRESSION: Faint enhancement is associated with the T2 hyperintense lesions, an inflammatory/demyelinating process is again favored. Given pontine involvement and chart history of genitourinary lesions, specifically consider Bechet's. Further discussion above. Electronically Signed   By: Tiburcio Pea M.D.   On: 03/29/2021 04:28   CT Abdomen Pelvis W Contrast  Result Date: 03/28/2021 CLINICAL DATA:  Left groin pain. EXAM: CT ABDOMEN AND PELVIS WITH CONTRAST TECHNIQUE: Multidetector CT imaging of the abdomen and pelvis was performed using the standard protocol following bolus administration of intravenous contrast.  CONTRAST:  OMNIPAQUE IOHEXOL 300 MG/ML  SOLN COMPARISON:  CT scan 03/25/2021 FINDINGS: Lower chest: The lung bases are clear of acute process. No pleural effusion or pulmonary lesions. The heart is normal in size. No pericardial effusion. The distal esophagus and aorta are unremarkable. Hepatobiliary: No hepatic lesions or intrahepatic biliary dilatation. The gallbladder is contracted. No common bile duct dilatation. Pancreas: No mass, inflammation or ductal dilatation. Spleen: Normal size.  No focal lesions. Adrenals/Urinary Tract: Adrenal glands and kidneys are. The bladder is normal. Stomach/Bowel: The stomach, duodenum, small bowel and colon are unremarkable. The terminal ileum and appendix are normal. Vascular/Lymphatic: The aorta is normal in caliber. No dissection. The branch vessels are patent. The major venous structures are patent. No mesenteric or retroperitoneal mass or adenopathy. Small scattered lymph nodes are noted. Reproductive: The prostate gland and seminal vesicles are unremarkable. Other: Similar to the prior CT scan there are enlarged pelvic and inguinal lymph nodes. Some improvement when compared to the prior study suggesting these are inflammatory/reactive. The left groin inflammatory process/cellulitis is much improved. No abscess. Musculoskeletal: No significant bony findings. IMPRESSION: 1. No acute abdominal/pelvic findings. 2. Improved/largely resolved left groin inflammatory process/cellulitis. 3. Persistent but slightly improved pelvic and inguinal lymphadenopathy, likely inflammatory/reactive. Electronically Signed   By: Rudie Meyer M.D.   On: 03/28/2021 16:00   EEG adult  Result Date: 03/29/2021 Charlsie Quest, MD     03/29/2021  2:21 PM Patient Name: Robert Sperl MRN: [id number] Epilepsy Attending: Charlsie Quest Referring Physician/Provider: Leda Gauze, NP Date: 03/29/2021 Duration: 22.38 mins Patient history: 35 y.o. male with PMH significant for  everyday smoker who presents with multiple focal seizures with retained awareness and 2 focal seizures with secondary generalization. EEG to evaluate for seizure. Level of alertness: Awake, asleep AEDs during EEG study: LEV Technical aspects: This EEG study was done with scalp electrodes positioned according to the 10-20 International system of electrode placement. Electrical activity was  acquired at a sampling rate of 500Hz  and reviewed with a high frequency filter of 70Hz  and a low frequency filter of 1Hz . EEG data were recorded continuously and digitally stored. Description: The posterior dominant rhythm consists of 9 Hz activity of moderate voltage (25-35 uV) seen predominantly in posterior head regions, asymmetric ( left<right)  and reactive to eye opening and eye closing.Sleep was characterized by vertex waves, sleep spindles (12 to 14 Hz), maximal frontocentral region. EEG showed intermittent generalized and lateralized left hemisphere 3 to 6 Hz theta-delta slowing.  Hyperventilation and photic stimulation were not performed.   ABNORMALITY - Intermittent slow, generalized and lateralized left hemisphere - Background asymmetry, left<right IMPRESSION: This study is suggestive of cortical dysfunction arising from left hemisphere, nonspecific etiology but could be secondary to underlying structural abnormality. Additionally there is mild diffuse encephalopathy, nonspecific etiology. No seizures or epileptiform discharges were seen throughout the recording. Priyanka O Yadav     Assessment and Plan:   Bradycardia while sleeping: The patient is unaware of any prior cardiac history.  It is certainly possible there may be a component of underlying sleep apnea which may have contributed to his bradycardia.  He also has had recent seizure activity and was treated with Keppra and subsequent lethargy.   ECG now shows normal sinus rhythm at 65 bpm with mild sinus arrhythmia.  PR interval 134 ms and QTc interval 424  ms.  Plan to obtain a 2D echo Doppler study in a.m. to evaluate systolic and diastolic function, valvular architecture. Recent multiple seizures over 24-hour.  Started for encephalopathy with suspected CNS infection for which he underwent lumbar puncture today with clear fluid Fever, status post recent diagnosis and treatment for gonorrhea.  Currently on acyclovir and ceftriaxone Hypokalemia with potassium 3.4, replete to 4.0; check magnesium Anemia    For questions or updates, please contact [facility] Please consult [email or web address] for contact info under    Signed, , MD  03/29/2021 6:32 PM

## 2021-03-29 NOTE — ED Notes (Signed)
Pt back from MRI - temp down to 99 now - Pt hooked up to monitor and labs drawn - pt is very lethargic but able to answer questions appropriately with much verbal and physical stimuli - MD and Neurologist notified

## 2021-03-29 NOTE — Progress Notes (Signed)
PROGRESS NOTE    James Obrien  NID:[id number] DOB: 1986-02-03 DOA: 03/28/2021 PCP: Pcp, No   Chief Complaint  Patient presents with   Seizures   Brief Narrative/Hospital Course: 35 year old male with with no known significant medical history brought to the ED with witnessed generalized tonic-clonic seizure.  Symptom onset around 9 AM at work with tingling and numbness in his left hand subsequently around noon weakness continue TCS lasting 2 to 5 minutes, woke up in the ambulance, had bitten his tongue but no incontinence was drowsy but not confused again had another witnessed full tonic-clonic seizures in the ambulance, given Versed and brought to the ED. Patient since Monday also complaining of left groin area of bite with a lesion on the suprapubic left groin area and some on his penis with some discharge.  In ED: T-max 100.1, tachycardia 113, lab with lactic acidosis 4.1 WBC 10.8, UA unremarkable, chest x-ray no acute finding , RPR HIV negative, blood cultures x2 sets ordered underwent CT head no acute finding subsequently MRI brain without contrast was abnormal  so repeated with contrast and also had CT abdomen pelvis as below:  MRI brain without contrast: "thickening in the maxillary sinuses. No mastoid or middle ear effusion. Normal orbits. IMPRESSION: 1. Multifocal abnormal hyperintense T2-weighted signal within both hemispheres, but greatest at the right pre and postcentral gyri. The appearance is concerning for acute demyelination. Lymphoma is a secondary possibility. Postcontrast imaging is recommended as an adjunct to this study, for further evaluation"  Mri BRAIN  W/ CONTRAST "Faint enhancement is associated with the T2 hyperintense lesions, an inflammatory/demyelinating process is again favored. Given pontine involvement and chart history of genitourinary lesions, specifically consider Bechet's"  CT abdomen pelvis  " No acute abdominal/pelvic findings. 2. Improved/largely  resolved left groin inflammatory process/cellulitis. 3. Persistent but slightly improved pelvic and inguinal lymphadenopathy, likely inflammatory/reactive"  Patient was given IV Rocephin for gonorrhea, seen by neurology felt he had focal seizures with secondary generalization started on Keppra, seizure precaution EEG ordered and admitted  Subjective: Seen this morning in the ED.  Wife is at the bedside.  Patient is somnolent able to wake up and interact mildly confused. Follows commands appropriately Complains of feeling cold and shivering.  Assessment & Plan:  2 Focal seizures with secondary generalization: MRI brain with and without contrast abnormal as above, continue Keppra per neurology await further recommendation.  EEG pending.  Continue seizure precaution, cannot drive at least 6 months .  Continue Ativan as needed for seizures lasting more than 3 minutes. MRI with enhancement.  T2 hyperintense lesions, and inflammatory/demyelinating process is favored-?behect's per radiology- has no obvious oral mucosal lesion, although w/ groin rash on left-?  Herpes,,Can check inflammatory markers ESR/CRP.  Consulted ID, possible LP per neuro. Patient needs following instruction for discharge Per [facility] statutes, patients with seizures are not allowed to drive until  they have been seizure-free for six months. Use caution when using heavy equipment or power tools. Avoid working on ladders or at heights. Take showers instead of baths. Ensure the water temperature is not too high on the home water heater. Do not go swimming alone. When caring for infants or small children, sit down when holding, feeding, or changing them to minimize risk of injury to the child in the event you have a seizure.  Maintain good sleep hygiene. Avoid alcohol.   Bradycardia with heart rate 37-38 while sleeping increased to 45-50 after waking up but dropped back to 30-40, status  post atropine inED, ordered EKG,  consulted cardiology. Check tsh, uds pending.  Heart rate in 40s to 50s, blood pressure stable currently.  Patient denies any chest pain.  Lactic acidosis likely from seizure.  Resolved  Gonorrhea: Tested positive with gonorrhea on 9/26 and was treated with ceftriaxone.   Cellulitis in the left groin: CT this admission shows improved cellulitic changes.  Had some mild redness in the left groin continue ceftriaxone, follow-up blood culture  Urine retention in and out cath x1, check postvoid residual, ordere bladder scan, Flomax nightly.  Mild hypokalemia replacement ordered  Diet Order             Diet regular Room service appropriate? Yes; Fluid consistency: Thin  Diet effective now                   DVT prophylaxis: enoxaparin (LOVENOX) injection 40 mg Start: 03/28/21 2200 Code Status:   Code Status: Full Code  Family Communication: plan of care discussed with patient and his wife  at bedside. Status is: Observation Remains hospitalized for ongoing management Dispo: The patient is from: Home              Anticipated d/c is to: Home              Patient currently is not medically stable to d/c.   Difficult to place patient No Objective: Vitals: Today's Vitals   03/29/21 0915 03/29/21 1015 03/29/21 1030 03/29/21 1100  BP: 114/67 133/75 114/76 (!) 135/58  Pulse: (!) 45 (!) 49 (!) 52 (!) 54  Resp: 20 15 (!) 21 (!) 22  Temp:      TempSrc:      SpO2: 100% 100% 100% 100%  Weight:      Height:      PainSc:       Physical Examination: General exam: Somnolent able to wake up and interact, weak,older than stated age. HEENT:Oral mucosa moist, Ear/Nose WNL grossly,dentition normal. Respiratory system: B/l clear breath sounds BS, no use of accessory muscle, non tender. Cardiovascular system: S1 & S2 +,No JVD. Gastrointestinal system: Abdomen soft, NT,ND, BS+. Nervous System:Alert, awake, moving extremities well nonfocal, no neck stiffness. Extremities: Leg edema absent, distal  peripheral pulses palpable.  Skin: Mild erythema on the left groin, rashes, no icterus. MSK: Normal muscle bulk,tone, power.  Medications reviewed:  Scheduled Meds:  enoxaparin (LOVENOX) injection  40 mg Subcutaneous Q24H   levETIRAcetam  500 mg Oral BID   tamsulosin  0.4 mg Oral Daily   Continuous Infusions:  sodium chloride 75 mL/hr at 03/28/21 2357   cefTRIAXone (ROCEPHIN)  IV      Intake/Output  Intake/Output Summary (Last 24 hours) at 03/29/2021 1117 Last data filed at 03/28/2021 2105 Gross per 24 hour  Intake 1000 ml  Output 1200 ml  Net -200 ml   Intake/Output from previous day: 09/29 0701 - 09/30 0700 In: 1000 [IV Piggyback:1000] Out: 1200 [Urine:1200] Net IO Since Admission: -200 mL [03/29/21 1117]   Weight change:   Wt Readings from Last 3 Encounters:  03/28/21 74.8 kg  03/25/21 68.9 kg     Consultants: NEUROLOGY  Procedures:see note Antimicrobials: Anti-infectives (From admission, onward)    Start     Dose/Rate Route Frequency Ordered Stop   03/29/21 1700  cefTRIAXone (ROCEPHIN) 1 g in sodium chloride 0.9 % 100 mL IVPB        1 g 200 mL/hr over 30 Minutes Intravenous Every 24 hours 03/29/21 0820 04/05/21 1659  Culture/Microbiology    Component Value Date/Time   SDES BLOOD LEFT ANTECUBITAL 03/28/2021 1450   SPECREQUEST  03/28/2021 1450    BOTTLES DRAWN AEROBIC AND ANAEROBIC Blood Culture results may not be optimal due to an excessive volume of blood received in culture bottles   CULT  03/28/2021 1450    NO GROWTH < 24 HOURS Performed at [facility] [address] [place], [place] [address]    REPTSTATUS PENDING 03/28/2021 1450    Other culture-see note  Unresulted Labs (From admission, onward)     Start     Ordered   03/29/21 1043  Sedimentation rate  Add-on,   AD        03/29/21 1042   03/29/21 1043  C-reactive protein  Add-on,   AD        03/29/21 1042   03/28/21 1920  TSH  Add-on,   AD        03/28/21 1920   03/28/21  1447  Urine rapid drug screen (hosp performed)  ONCE - STAT,   STAT        03/28/21 1446   03/28/21 1447  Ethanol  Once,   STAT        03/28/21 1446   03/28/21 1444  Urine Culture  (Undifferentiated presentation (screening labs and basic nursing orders))  ONCE - STAT,   STAT       Question:  Indication  Answer:  Sepsis   03/28/21 1444           Data Reviewed: I have personally reviewed following labs and imaging studies CBC: Recent Labs  Lab 03/25/21 1705 03/28/21 1444 03/28/21 1525 03/29/21 0438  WBC 7.2 10.8*  --  10.1  NEUTROABS 4.4 7.8*  --   --   HGB 14.3 12.9* 11.6* 11.2*  HCT 41.1 39.2 34.0* 34.8*  MCV 88.8 92.9  --  94.1  PLT 181 214  --  163   Basic Metabolic Panel: Recent Labs  Lab 03/25/21 1705 03/28/21 1444 03/28/21 1525 03/28/21 1930 03/29/21 0438  NA 135 138 140  --  138  K 3.7 4.8 4.3  --  3.4*  CL 99 104 104  --  109  CO2 25 23  --   --  21*  GLUCOSE 98 103* 102*  --  100*  BUN '14 13 12  ' --  8  CREATININE 0.92 1.27* 1.10  --  1.00  CALCIUM 9.3 8.7*  --   --  8.4*  MG  --   --   --  2.3  --    GFR: Estimated Creatinine Clearance: 111.2 mL/min (by C-G formula based on SCr of 1 mg/dL). Liver Function Tests: Recent Labs  Lab 03/25/21 1705 03/28/21 1444  AST 35 36  ALT 31 25  ALKPHOS 45 39  BILITOT 0.7 0.4  PROT 8.1 6.9  ALBUMIN 4.3 3.6   No results for input(s): LIPASE, AMYLASE in the last 168 hours. No results for input(s): AMMONIA in the last 168 hours. Coagulation Profile: Recent Labs  Lab 03/28/21 1444  INR 1.1   Cardiac Enzymes: No results for input(s): CKTOTAL, CKMB, CKMBINDEX, TROPONINI in the last 168 hours. BNP (last 3 results) No results for input(s): PROBNP in the last 8760 hours. HbA1C: No results for input(s): HGBA1C in the last 72 hours. CBG: No results for input(s): GLUCAP in the last 168 hours. Lipid Profile: No results for input(s): CHOL, HDL, LDLCALC, TRIG, CHOLHDL, LDLDIRECT in the last 72 hours. Thyroid  Function Tests: No results for input(s): TSH, T4TOTAL, FREET4, T3FREE, THYROIDAB in the last 72 hours. Anemia Panel: No results for input(s): VITAMINB12, FOLATE, FERRITIN, TIBC, IRON, RETICCTPCT in the last 72 hours. Sepsis Labs: Recent Labs  Lab 03/25/21 1705 03/28/21 1444 03/28/21 1908  LATICACIDVEN 0.9 4.1* 1.0    Recent Results (from the past 240 hour(s))  Culture, blood (Routine X 2) w Reflex to ID Panel     Status: None (Preliminary result)   Collection Time: 03/25/21  5:04 PM   Specimen: BLOOD  Result Value Ref Range Status   Specimen Description   Final    BLOOD RIGHT ANTECUBITAL Performed at [facility], [place] [address], [place], [place] [address]    Special Requests   Final    BOTTLES DRAWN AEROBIC AND ANAEROBIC Blood Culture adequate volume Performed at [facility], [address] [place], [place] [address]    Culture   Final    NO GROWTH 4 DAYS Performed at [facility], [place] [address], [place], [place] [address]    Report Status PENDING  Incomplete  Culture, blood (Routine X 2) w Reflex to ID Panel     Status: None (Preliminary result)   Collection Time: 03/25/21  5:12 PM   Specimen: BLOOD  Result Value Ref Range Status   Specimen Description   Final    BLOOD LEFT ANTECUBITAL Performed at [facility], [place] [address] [place], [place] [address]    Special Requests   Final    BOTTLES DRAWN AEROBIC AND ANAEROBIC Blood Culture adequate volume Performed at [facility], [address] [place], [place] [address]    Culture   Final    NO GROWTH 4 DAYS Performed at [facility], [place] [address], [place], [place] [address]    Report Status PENDING  Incomplete  Blood Culture (routine x 2)     Status: None (Preliminary result)   Collection Time: 03/28/21  2:45 PM   Specimen: BLOOD  Result Value Ref Range Status   Specimen Description BLOOD RIGHT UPPER ARM  Final   Special Requests   Final    BOTTLES DRAWN AEROBIC  AND ANAEROBIC Blood Culture results may not be optimal due to an excessive volume of blood received in culture bottles   Culture   Final    NO GROWTH < 24 HOURS Performed at [facility], [place] [address], [place], [place] [address]    Report Status PENDING  Incomplete  Blood Culture (routine x 2)     Status: None (Preliminary result)   Collection Time: 03/28/21  2:50 PM   Specimen: BLOOD  Result Value Ref Range Status   Specimen Description BLOOD LEFT ANTECUBITAL  Final   Special Requests   Final    BOTTLES DRAWN AEROBIC AND ANAEROBIC Blood Culture results may not be optimal due to an excessive volume of blood received in culture bottles   Culture   Final    NO GROWTH < 24 HOURS Performed at [facility], [place] [address], [place], [place] [address]    Report Status PENDING  Incomplete     Radiology Studies: DG Chest 1 View  Addendum Date: 03/28/2021   ADDENDUM REPORT: 03/28/2021 15:57 Electronically Signed   By: Donavan Foil M.D.   On: 03/28/2021 15:57   Result Date: 03/28/2021 CLINICAL DATA:  Seizure and fell out of chair EXAM: CHEST  1 VIEW COMPARISON:  None.  FINDINGS: No focal opacity or pleural effusion. Borderline cardiomegaly. No pneumothorax. IMPRESSION: Borderline cardiomegaly without edema or focal airspace disease. Electronically Signed: By: Donavan Foil M.D. On: 03/28/2021 15:55   CT Head Wo Contrast  Result Date: 03/28/2021 CLINICAL DATA:  Seizure. EXAM: CT HEAD WITHOUT CONTRAST TECHNIQUE: Contiguous axial images were obtained from the base of the skull through the vertex without intravenous contrast. COMPARISON:  None. FINDINGS: Brain: No evidence of acute infarction, hemorrhage, hydrocephalus, extra-axial collection or mass lesion/mass effect. Vascular: No hyperdense vessel or unexpected calcification. Skull: Normal. Negative for fracture or focal lesion. Sinuses/Orbits: Air-fluid level noted in the left maxillary sinus. Other: None. IMPRESSION: 1. No acute  intracranial abnormalities. 2. Air-fluid level within the left maxillary sinus. Electronically Signed   By: Kerby Moors M.D.   On: 03/28/2021 15:49   MR BRAIN WO CONTRAST  Result Date: 03/28/2021 CLINICAL DATA:  Seizure EXAM: MRI HEAD WITHOUT CONTRAST TECHNIQUE: Multiplanar, multiecho pulse sequences of the brain and surrounding structures were obtained without intravenous contrast. COMPARISON:  None. FINDINGS: Brain: There is no acute hemorrhage or acute infarct. There is an area of irregular hyperintense T2-weighted signal that involves the precentral and postcentral gyri. There are other scattered foci of hyperintense T2-weighted signal, many of which have an abnormal, rounded morphology. Vascular: Major flow voids are preserved. Skull and upper cervical spine: Normal calvarium and skull base. Visualized upper cervical spine and soft tissues are normal. Sinuses/Orbits:Mucosal thickening in the maxillary sinuses. No mastoid or middle ear effusion. Normal orbits. IMPRESSION: 1. Multifocal abnormal hyperintense T2-weighted signal within both hemispheres, but greatest at the right pre and postcentral gyri. The appearance is concerning for acute demyelination. Lymphoma is a secondary possibility. Postcontrast imaging is recommended as an adjunct to this study, for further evaluation. Electronically Signed   By: Ulyses Jarred M.D.   On: 03/28/2021 22:54   MR BRAIN W CONTRAST  Result Date: 03/29/2021 CLINICAL DATA:  Brain mass or lesion EXAM: MRI HEAD WITH CONTRAST TECHNIQUE: Multiplanar, multiecho pulse sequences of the brain and surrounding structures were obtained with intravenous contrast. CONTRAST:  61m GADAVIST GADOBUTROL 1 MMOL/ML IV SOLN COMPARISON:  Noncontrast brain MRI from earlier today FINDINGS: Motion degraded study. Some of the patient's lesions show faint amorphous enhancement, including in the right frontal parietal cortex and juxtacortical region, the left occipital cortex, in indistinctly  in the pons. Major vessels are enhancing. 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 Negative orbits and skull. There is chart history of lesions involving the genitals. Neuro Bechet's is a leading consideration especially given the pontine involvement. Other inflammatory processes such as ADEM or sarcoid are considered. An infiltrating neoplasm such as lymphoma is a consideration, but usually much more avidly enhancing. Need follow-up. IMPRESSION: Faint enhancement is associated with the T2 hyperintense lesions, an inflammatory/demyelinating process is again favored. Given pontine involvement and chart history of genitourinary lesions, specifically consider Bechet's. Further discussion above. Electronically Signed   By: JJorje GuildM.D.   On: 03/29/2021 04:28   CT Abdomen Pelvis W Contrast  Result Date: 03/28/2021 CLINICAL DATA:  Left groin pain. EXAM: CT ABDOMEN AND PELVIS WITH CONTRAST TECHNIQUE: Multidetector CT imaging of the abdomen and pelvis was performed using the standard protocol following bolus administration of intravenous contrast. CONTRAST:  1013mOMNIPAQUE IOHEXOL 300 MG/ML  SOLN COMPARISON:  CT scan 03/25/2021 FINDINGS: Lower chest: The lung bases are clear of acute process. No pleural effusion or  pulmonary lesions. The heart is normal in size. No pericardial effusion. The distal esophagus and aorta are unremarkable. Hepatobiliary: No hepatic lesions or intrahepatic biliary dilatation. The gallbladder is contracted. No common bile duct dilatation. Pancreas: No mass, inflammation or ductal dilatation. Spleen: Normal size.  No focal lesions. Adrenals/Urinary Tract: Adrenal glands and kidneys are. The bladder is normal. Stomach/Bowel: The stomach, duodenum, small bowel and colon are unremarkable. The terminal ileum and appendix are normal. Vascular/Lymphatic: The aorta is normal in caliber. No  dissection. The branch vessels are patent. The major venous structures are patent. No mesenteric or retroperitoneal mass or adenopathy. Small scattered lymph nodes are noted. Reproductive: The prostate gland and seminal vesicles are unremarkable. Other: Similar to the prior CT scan there are enlarged pelvic and inguinal lymph nodes. Some improvement when compared to the prior study suggesting these are inflammatory/reactive. The left groin inflammatory process/cellulitis is much improved. No abscess. Musculoskeletal: No significant bony findings. IMPRESSION: 1. No acute abdominal/pelvic findings. 2. Improved/largely resolved left groin inflammatory process/cellulitis. 3. Persistent but slightly improved pelvic and inguinal lymphadenopathy, likely inflammatory/reactive. Electronically Signed   By: Marijo Sanes M.D.   On: 03/28/2021 16:00     LOS: 0 days   Antonieta Pert, MD [facility]  03/29/2021, 11:17 AM

## 2021-03-29 NOTE — ED Notes (Signed)
MD Chotiner made aware of pt temp

## 2021-03-29 NOTE — Procedures (Signed)
LUMBAR PUNCTURE (SPINAL TAP) PROCEDURE NOTE  Indication: Encephalopathy, seizures, suspected CNS infection.    Proceduralists: Willeen Niece, MD.                           Eula Listen, NP.    Risks of the procedure were discussed with the wife (as patient is encephalopathic) including post-LP headache, bleeding, infection, weakness/numbness of legs(radiculopathy), death.    Consent obtained from: wife.    Procedure Note The patient was prepped and draped, and using sterile technique a 20 gauge quinke spinal needle was inserted in the L4-5 space.   Opening pressure was 17 cm H2O.  Approximately 17cc of CSF were obtained and sent for analysis.Clear fluid.  Patient tolerated the procedure well and blood loss was minimal.  Orders for4 CSF testing placed by neuro and ID. Specimen walked to the lab by [facility].

## 2021-03-29 NOTE — ED Notes (Signed)
MD Chotiner notified of temp recheck

## 2021-03-29 NOTE — ED Notes (Signed)
Charge RN made aware of situation - pt to be moved

## 2021-03-29 NOTE — ED Notes (Addendum)
Per MD Chotiner day and neuro team will review MRI

## 2021-03-29 NOTE — Progress Notes (Signed)
Notified by RN that pt with HR of 37-38 bpm. Sleeping. If woken up HR increases to 45-50 but then drops back to 38-40. Pt continues to be lethargic since admission. Given 0.5 mg atropine and will monitor closely.

## 2021-03-29 NOTE — ED Notes (Signed)
Pt states he feels some general discomfort but overall no pain. Pt also states that he does not feel any more pressure/need to void at this time. Pt wife remains at bedside and pt given snacks

## 2021-03-29 NOTE — Procedures (Signed)
Patient Name: James Obrien  MRN: [id number]  Epilepsy Attending: Charlsie Quest  Referring Physician/Provider: Leda Gauze, NP Date: 03/29/2021 Duration: 22.38 mins  Patient history: 35 y.o. male with PMH significant for everyday smoker who presents with multiple focal seizures with retained awareness and 2 focal seizures with secondary generalization. EEG to evaluate for seizure.   Level of alertness: Awake, asleep  AEDs during EEG study: LEV  Technical aspects: This EEG study was done with scalp electrodes positioned according to the 10-20 International system of electrode placement. Electrical activity was acquired at a sampling rate of 500Hz  and reviewed with a high frequency filter of 70Hz  and a low frequency filter of 1Hz . EEG data were recorded continuously and digitally stored.   Description: The posterior dominant rhythm consists of 9 Hz activity of moderate voltage (25-35 uV) seen predominantly in posterior head regions, asymmetric ( left<right)  and reactive to eye opening and eye closing.Sleep was characterized by vertex waves, sleep spindles (12 to 14 Hz), maximal frontocentral region. EEG showed intermittent generalized and lateralized left hemisphere 3 to 6 Hz theta-delta slowing.  Hyperventilation and photic stimulation were not performed.     ABNORMALITY - Intermittent slow, generalized and lateralized left hemisphere - Background asymmetry, left<right   IMPRESSION: This study is suggestive of cortical dysfunction arising from left hemisphere, nonspecific etiology but could be secondary to underlying structural abnormality. Additionally there is mild diffuse encephalopathy, nonspecific etiology. No seizures or epileptiform discharges were seen throughout the recording.  Schyler Counsell 

## 2021-03-29 NOTE — Progress Notes (Addendum)
Pharmacy Antibiotic Note  James Obrien is a 35 y.o. 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  Pharmacy has been consulted for acyclovir dosing.  WBC 10.1, Tm 102.61F SCr 1 - baseline  Plan: Start acyclovir 750 mg IV (10 mg/kg) every 8 hours Continue NS infusion @ 125 mL/hr to minimize renal toxicity  Monitor renal function F/u LOT, infectious work up and de-escalate as able  Temp (24hrs), Avg:100.1 F (37.8 C), Min:99 F (37.2 C), Max:102.5 F (39.2 C)  Recent Labs  Lab 03/25/21 1705 03/28/21 1444 03/28/21 1525 03/28/21 1908 03/29/21 0438  WBC 7.2 10.8*  --   --  10.1  CREATININE 0.92 1.27* 1.10  --  1.00  LATICACIDVEN 0.9 4.1*  --  1.0  --     Estimated Creatinine Clearance: 111.2 mL/min (by C-G formula based on SCr of 1 mg/dL).    No Known Allergies  Antimicrobials this admission: CTX 9/30 > 10/7 Acyclovir 9/30 >>  Dose adjustments this admission: none  Microbiology results: 9/29 BCx:  9/29 UCx:    Thank you for allowing pharmacy to be a part of this patient's care.  Filbert Schilder, PharmD PGY1 Pharmacy Resident 03/29/2021  10:11 AM  Please check [email or web address] for unit-specific pharmacy phone numbers.

## 2021-03-30 ENCOUNTER — Inpatient Hospital Stay (HOSPITAL_COMMUNITY): Payer: 59

## 2021-03-30 DIAGNOSIS — R008 Other abnormalities of heart beat: Secondary | ICD-10-CM | POA: Diagnosis not present

## 2021-03-30 DIAGNOSIS — R569 Unspecified convulsions: Secondary | ICD-10-CM | POA: Diagnosis not present

## 2021-03-30 DIAGNOSIS — R001 Bradycardia, unspecified: Secondary | ICD-10-CM | POA: Diagnosis not present

## 2021-03-30 LAB — BASIC METABOLIC PANEL
Anion gap: 10 (ref 5–15)
BUN: 7 mg/dL (ref 6–20)
CO2: 18 mmol/L — ABNORMAL LOW (ref 22–32)
Calcium: 8 mg/dL — ABNORMAL LOW (ref 8.9–10.3)
Chloride: 109 mmol/L (ref 98–111)
Creatinine, Ser: 1.1 mg/dL (ref 0.61–1.24)
GFR, Estimated: >60 mL/min (ref >60)
Glucose, Bld: 97 mg/dL (ref 70–99)
Potassium: 3.8 mmol/L (ref 3.5–5.1)
Sodium: 137 mmol/L (ref 135–145)

## 2021-03-30 LAB — CBC WITH DIFFERENTIAL/PLATELET
Abs Immature Granulocytes: 0 10*3/uL (ref 0.00–0.07)
Basophils Absolute: 0.1 10*3/uL (ref 0.0–0.1)
Basophils Relative: 1 %
Eosinophils Absolute: 0.1 10*3/uL (ref 0.0–0.5)
Eosinophils Relative: 1 %
HCT: 37.1 % — ABNORMAL LOW (ref 39.0–52.0)
Hemoglobin: 11.8 g/dL — ABNORMAL LOW (ref 13.0–17.0)
Lymphocytes Relative: 20 %
Lymphs Abs: 2.1 10*3/uL (ref 0.7–4.0)
MCH: 29.9 pg (ref 26.0–34.0)
MCHC: 31.8 g/dL (ref 30.0–36.0)
MCV: 94.2 fL (ref 80.0–100.0)
Monocytes Absolute: 0.6 10*3/uL (ref 0.1–1.0)
Monocytes Relative: 6 %
Neutro Abs: 7.6 10*3/uL (ref 1.7–7.7)
Neutrophils Relative %: 72 %
Platelets: 215 10*3/uL (ref 150–400)
RBC: 3.94 MIL/uL — ABNORMAL LOW (ref 4.22–5.81)
RDW: 13.4 % (ref 11.5–15.5)
WBC: 10.6 10*3/uL — ABNORMAL HIGH (ref 4.0–10.5)
nRBC: 0 % (ref 0.0–0.2)
nRBC: 0 /100 WBC

## 2021-03-30 LAB — CULTURE, BLOOD (ROUTINE X 2)
Culture: NO GROWTH
Culture: NO GROWTH
Special Requests: ADEQUATE
Special Requests: ADEQUATE

## 2021-03-30 LAB — ECHOCARDIOGRAM COMPLETE
AR max vel: 2.86 cm2
AV Area VTI: 3 cm2
AV Area mean vel: 3.07 cm2
AV Mean grad: 6 mmHg
AV Peak grad: 14.1 mmHg
Ao pk vel: 1.88 m/s
Area-P 1/2: 2.76 cm2
Height: 71 in
S' Lateral: 3.5 cm
Weight: 2640 oz

## 2021-03-30 LAB — MAGNESIUM: Magnesium: 1.9 mg/dL (ref 1.7–2.4)

## 2021-03-30 MED ORDER — ENOXAPARIN SODIUM 40 MG/0.4ML IJ SOSY
40.0000 mg | PREFILLED_SYRINGE | INTRAMUSCULAR | Status: DC
  Administered 2021-03-30 – 2021-04-05 (×7): 40 mg via SUBCUTANEOUS
  Filled 2021-03-30 (×7): qty 0.4

## 2021-03-30 MED ORDER — SODIUM CHLORIDE 0.9 % IV SOLN
250.0000 mg | Freq: Every day | INTRAVENOUS | Status: AC
  Administered 2021-03-30 – 2021-04-01 (×3): 250 mg via INTRAVENOUS
  Filled 2021-03-30 (×3): qty 4

## 2021-03-30 NOTE — Progress Notes (Addendum)
Neurology Progress Note  S: He does not feel as sleepy as yesterday and he is talking in sentences. He feels better than yesterday in general. No shivering. Per wife, he still has some difficulty with expressive aphasia. No pain, HA, n/v, numbness or tingling.   O: Current vital signs: BP 121/64 (BP Location: Left Arm)   Pulse (!) 57   Temp 97.8 F (36.6 C) (Oral)   Resp 18   Ht _0  (1.803 m)   Wt 74.8 kg   SpO2 97%   BMI 23.01 kg/m  Vital signs in last 24 hours: Temp:  [97.8 F (36.6 C)-103.1 F (39.5 C)] 97.8 F (36.6 C) (10/01 0749) Pulse Rate:  [49-108] 57 (10/01 0749) Resp:  [15-28] 18 (10/01 0606) BP: (100-138)/(47-88) 121/64 (10/01 0749) SpO2:  [95 %-100 %] 97 % (10/01 0749)  GENERAL: Fairly well appearing male. Easily arousable to name calling than alert. NAD. HEENT: Normocephalic and atraumatic. LUNGS: Normal respiratory effort.  CV: RRR on tele.  Ext: warm.  NEURO:  Mental Status: Alert and oriented to name, age, hospital, city, state, and year. Disoriented to month, date, and day. Follows commands.  Speech/Language: speech without dysarthria. Some issues still with expressive aphasia. I.e., he will squinch up his face and say he is trying to think of the word. Naming, repetition, fluency, and comprehension intact.  Cranial Nerves:  II: PERRL. III, IV, VI: EOMI. Eyelids elevate symmetrically.  V: Sensation is intact to light touch and symmetrical to face.  VII: Smile is symmetrical. .  VIII: hearing intact to voice. IX, X: Palate elevates symmetrically. Phonation is normal.  XI: Shoulder shrug 5/5. XII: tongue is midline without fasciculations. Motor:  RUE: grip 5     bicep  5     tricep5 RLE: thigh 0    knee  0  plantar flexion  0  dorsiflexion 0 LUE: grip 5    bicep 5    tricep 5 LLE: thigh 0   knee 0   plantar flexion 0   dorsiflexion 0. He is able to wiggle his toes on the left.  Tone: is normal and bulk is normal. Sensation- Intact to light touch  bilaterally, but decreased on the right. Extinction absent to DSS.    Coordination: FTN intact bilaterally. Can not lift LEs. No drift UEs.   DTRs:  RUE:  pectoralis 3  brachioradialis 3      biceps 3 RLE:  patella   0   LUE:  pectoralis 3  brachioradialis  3  biceps 3 LLE:  patella 0 Gait- deferred.  Medications  Current Facility-Administered Medications:    0.9 %  sodium chloride infusion, , Intravenous, Continuous, Ralph Dowdy, [facility], Last Rate: 125 mL/hr at 03/30/21 7116, Restarted at 03/30/21 5790   acetaminophen (TYLENOL) tablet 650 mg, 650 mg, Oral, Q6H PRN, 650 mg at 03/30/21 0417 **OR** acetaminophen (TYLENOL) suppository 650 mg, 650 mg, Rectal, Q6H PRN, Orma Flaming, MD   acyclovir (ZOVIRAX) 750 mg in dextrose 5 % 150 mL IVPB, 750 mg, Intravenous, Q8H, Ralph Dowdy, RPH, Last Rate: 165 mL/hr at 03/30/21 0601, Infusion Verify at 03/30/21 0601   ampicillin (OMNIPEN) 2 g in sodium chloride 0.9 % 100 mL IVPB, 2 g, Intravenous, Q4H, Millen, Steffanie Dunn, RPH, Last Rate: 300 mL/hr at 03/30/21 0609, 2 g at 03/30/21 3833   cefTRIAXone (ROCEPHIN) 2 g in sodium chloride 0.9 % 100 mL IVPB, 2 g, Intravenous, Q12H, Greta Doom, MD, Stopped at 03/30/21 [phone]  Chlorhexidine Gluconate Cloth 2 % PADS 6 each, 6 each, Topical, Daily, Kc, Ramesh, MD, 6 each at 03/29/21 1832   ibuprofen (ADVIL) tablet 600 mg, 600 mg, Oral, Q6H PRN, Chotiner, Yevonne Aline, MD, 600 mg at 03/29/21 0145   levETIRAcetam (KEPPRA) tablet 500 mg, 500 mg, Oral, BID, Donnetta Simpers, MD, 500 mg at 03/29/21 2114   LORazepam (ATIVAN) injection 1 mg, 1 mg, Intravenous, Q4H PRN, Tacy Learn, PA-C   tamsulosin [facility]) capsule 0.4 mg, 0.4 mg, Oral, Daily, Kc, Ramesh, MD, 0.4 mg at 03/29/21 0923   vancomycin (VANCOCIN) IVPB 1000 mg/200 mL premix, 1,000 mg, Intravenous, Q8H, Millen, [name] B, RPH, Last Rate: 200 mL/hr at 03/30/21 0636, 1,000 mg at 03/30/21 0636  Pertinent Labs Results for CADYN, FANN (MRN  [id number]) as of 03/30/2021 09:27  Ref. Range 03/29/2021 14:49  Appearance, CSF Latest Ref Range: CLEAR  CLEAR (A)  Glucose, CSF Latest Ref Range: 40 - 70 mg/dL 55  RBC Count, CSF Latest Ref Range: 0 /cu mm 11 (H)  WBC, CSF Latest Ref Range: 0 - 5 /cu mm 104 (HH)  Segmented Neutrophils-CSF Latest Ref Range: 0 - 6 % 21 (H)  Lymphs, CSF Latest Ref Range: 40 - 80 % 64  Monocyte-Macrophage-Spinal Fluid Latest Ref Range: 15 - 45 % 13 (L)  Eosinophils, CSF Latest Ref Range: 0 - 1 % 2 (H)  Color, CSF Latest Ref Range: COLORLESS  COLORLESS  Supernatant Unknown NOT INDICATED  Total  Protein, CSF Latest Ref Range: 15 - 45 mg/dL 109 (H)  Tube # Unknown 3  CSF fungal culture pending. CSF gram stain pending. ESR 9.  Pathologist smear pending. Mg 1.9. [facility] send out test on CSF pending (meningitis and encephalopathy panel), CHF VZV pending. CSF HSV PCR pending.   No new Imaging MRI T spine and Lspine without contrast ordered stat.   EEG 03/29/21 This study is suggestive of cortical dysfunction arising from left hemisphere, nonspecific etiology but could be secondary to underlying structural abnormality. Additionally there is mild diffuse encephalopathy, nonspecific etiology. No seizures or epileptiform discharges were seen throughout the recording.   Assessment: 35 y.o. male with PMHx significant for everyday smoker who presents with multiple focal seizures with retained awareness and 2 focal seizures with secondary generalization. No seizure activity on EEG and RNs have not seen seizure like activity. Due to high fever and confusion, LP was done. Diffuse rash to groin s/p penile lesions is suspicious of infection. Further antibiotics were ordered. Today, he is unable to lift  his LEs and  has sensory and reflex deficits in LEs new since yesterday. This is concern for cord compression or epidural hematoma s/p LP. Spinal imaging stat ordered. Doubt Behcet's or GBS.    Impression: -Focal seizure with  secondary generalization.  -New sensory, reflex, and inability to move LEs.  -Pleocytosis on LP and elevated neutrophil count.   Recommendations/Plan:  -trend fever curve. (T Max 100.8) overnight.   -Monitor for seizures.  -Continue Keppra 557m po  q12h.  -Ativan 214mprn seizure lasting over 3 minutes and notify neurology.  -Tspine without contrast followed by Lspine without contrast. MRI stat.  -We will consider Cspine films when other MRIs resulted.   Pt seen by KaClance BollMSN, APN-BC/Nurse Practitioner/Neuro and later by MD. Note and plan to be edited as needed by MD.  Pager: [phone] I have seen the patient reviewed the above note.  Imaging recommended above has been completed at this time, and shows  an extensive myelitis.  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  Given the severity of his symptoms, I think that trying to minimize secondary injury from inflammation is important at this time.  Given that he most likely has an infectious process, I do not think that the typical 1 g daily dose of Solu-Medrol that we sometimes use for inflammatory myelitis is to be prudent, but I will use 250 mg of IV Solu-Medrol daily.  He will need continued monitoring and antibiotics.  Roland Rack, MD [facility] [phone]  If 7pm- 7am, please page neurology on call as listed in [place].

## 2021-03-30 NOTE — Progress Notes (Signed)
   03/30/21 1416  Clinical Encounter Type  Visited With Family  Visit Type Initial;Social support;Psychological support  Referral From Nurse  Consult/Referral To Chaplain  Spiritual Encounters  Spiritual Needs Emotional   Chaplain responded to page for family support. Chaplain spoke with Pt's wife, Janett Billow, in the unit waiting room. Janett Billow was understandably upset after learning the truth of the Pt's STD status. Chaplain engaged active listening and provided emotional support. Chaplain affirmed [name]'s need for support during this time. Janett Billow shared that her sister is a large part of her support system. Chaplain encouraged Janett Billow to tap into her support system so her and her kids' needs can be met. After the conversation, Janett Billow was more composed and ready to call her sister for support. Chaplain remains available.  This note was prepared by Chaplain Resident, Dante Gang, MDiv. Chaplain remains available as needed through the on-call pager: [phone].

## 2021-03-30 NOTE — Progress Notes (Signed)
  Echocardiogram 2D Echocardiogram has been performed.  James Obrien 03/30/2021, 1:17 PM

## 2021-03-30 NOTE — Progress Notes (Signed)
Echo attempted. Patient not in room. Will attempt again later a schedule permits.

## 2021-03-30 NOTE — Progress Notes (Addendum)
PROGRESS NOTE  James Obrien SWF:[id number] DOB: Sep 17, 1985 DOA: 03/28/2021 PCP: Pcp, No  HPI/Recap of past 24 hours: This is a 35 year old male with no previous significant medical history except for tobacco abuse since the age of 69 he has a 17-year pack history admitted for clonic tonic seizure.  He had a history of spider bite in the groin and recently was treated for gonorrhea.  In the emergency room he was noted to have a heart rate in the upper 30s and was given atropine.  Patient underwent lumbar puncture yesterday. Subjective: Patient seen and examined at bedside. He denies any new complaint    Assessment/Plan: Principal Problem:   Seizure (HCC) Active Problems:   Gonorrhea   Cellulitis   Bradycardia   1.  Seizure disorder.  His MRI with and without contrast with abnormal He was started on Keppra by neurology EEG also was recommended and it was completed it did not show any seizure focus or epileptiform discharge he does have mild diffuse encephalopathy He had a lumbar puncture with did not show any organism and it was clear he had elevated WBC however.  His CSF protein was also elevated His T-max is 102  2.  Bradycardia with heart rate between 37-38 while sleeping in the ED cardiology was consulted   and they are following  3.  Lactic acidosis resolving likely due to seizure  4.  Left groin cellulitis CT shows improving cellulitic changes Mild elevated WBC Continue ceftriaxone Follow-up blood culture blood culture shows staph epididymis  5.  Recent gonorrhea infection with a positive test on March 25, 2021 and was treated with ceftriaxone Code Status: Full  Severity of Illness: The appropriate patient status for this patient is INPATIENT. Inpatient status is judged to be reasonable and necessary in order to provide the required intensity of service to ensure the patient's safety. The patient's presenting symptoms, physical exam findings, and initial  radiographic and laboratory data in the context of their chronic comorbidities is felt to place them at high risk for further clinical deterioration. Furthermore, it is not anticipated that the patient will be medically stable for discharge from the hospital within 2 midnights of admission. The following factors support the patient status of inpatient.  She is disorder left upper arm weakness work-up in progress   * I certify that at the point of admission it is my clinical judgment that the patient will require inpatient hospital care spanning beyond 2 midnights from the point of admission due to high intensity of service, high risk for further deterioration and high frequency of surveillance required.*   Family Communication: Discussed with his wife Shanda Bumps at bedside  Disposition Plan: Home Status is: Inpatient   Dispo: The patient is from: Home              Anticipated d/c is to:               Anticipated d/c date is:               Patient currently not medically stable for discharge  Consultants: Neurology Cardiology Infectious disease  Procedures: Lumbar puncture EEG  Antimicrobials: Ampicillin Ceftriaxone  Acyclovir  DVT prophylaxis: Lovenox   Objective: Vitals:   03/30/21 0414 03/30/21 0500 03/30/21 0606 03/30/21 0749  BP: 114/62  (!) 115/59 121/64  Pulse: 87  (!) 58 (!) 57  Resp: 20  18   Temp: (!) 102 F (38.9 C) (!) 100.8 F (38.2 C) 98.7 F (37.1 C)  97.8 F (36.6 C)  TempSrc: Oral Oral Axillary Oral  SpO2: 99%  95% 97%  Weight:      Height:        Intake/Output Summary (Last 24 hours) at 03/30/2021 1046 Last data filed at 03/30/2021 0658 Gross per 24 hour  Intake 3817.31 ml  Output 800 ml  Net 3017.31 ml   Filed Weights   03/28/21 1500  Weight: 74.8 kg   Body mass index is 23.01 kg/m.  Exam:  General: 35 y.o. year-old male well developed well nourished in no acute distress.  Alert and oriented .  He is T-max is 102.  He had a temperature of  103 which required a lumbar puncture Cardiovascular: Regular rate and rhythm with no rubs or gallops.  No thyromegaly or JVD noted.   Respiratory: Clear to auscultation with no wheezes or rales. Good inspiratory effort. Abdomen: Soft nontender nondistended with normal bowel sounds x4 quadrants. Musculoskeletal: No lower extremity edema. 2/4 pulses in all 4 extremities. Skin: Cellulitis of groin, Psychiatry: Mood is appropriate for condition and setting Neurology: There is weakness of the left upper extremity particularly to flexion at the wrist and grip Also he is slow to answer questions    Data Reviewed: CBC: Recent Labs  Lab 03/25/21 1705 03/28/21 1444 03/28/21 1525 03/29/21 0438 03/30/21 0942  WBC 7.2 10.8*  --  10.1 10.6*  NEUTROABS 4.4 7.8*  --   --  PENDING  HGB 14.3 12.9* 11.6* 11.2* 11.8*  HCT 41.1 39.2 34.0* 34.8* 37.1*  MCV 88.8 92.9  --  94.1 94.2  PLT 181 214  --  222 215   Basic Metabolic Panel: Recent Labs  Lab 03/25/21 1705 03/28/21 1444 03/28/21 1525 03/28/21 1930 03/29/21 0438 03/30/21 0135  NA 135 138 140  --  138 137  K 3.7 4.8 4.3  --  3.4* 3.8  CL 99 104 104  --  109 109  CO2 25 23  --   --  21* 18*  GLUCOSE 98 103* 102*  --  100* 97  BUN 14 13 12   --  8 7  CREATININE 0.92 1.27* 1.10  --  1.00 1.10  CALCIUM 9.3 8.7*  --   --  8.4* 8.0*  MG  --   --   --  2.3  --  1.9   GFR: Estimated Creatinine Clearance: 101.1 mL/min (by C-G formula based on SCr of 1.1 mg/dL). Liver Function Tests: Recent Labs  Lab 03/25/21 1705 03/28/21 1444  AST 35 36  ALT 31 25  ALKPHOS 45 39  BILITOT 0.7 0.4  PROT 8.1 6.9  ALBUMIN 4.3 3.6   No results for input(s): LIPASE, AMYLASE in the last 168 hours. No results for input(s): AMMONIA in the last 168 hours. Coagulation Profile: Recent Labs  Lab 03/28/21 1444  INR 1.1   Cardiac Enzymes: No results for input(s): CKTOTAL, CKMB, CKMBINDEX, TROPONINI in the last 168 hours. BNP (last 3 results) No results  for input(s): PROBNP in the last 8760 hours. HbA1C: No results for input(s): HGBA1C in the last 72 hours. CBG: No results for input(s): GLUCAP in the last 168 hours. Lipid Profile: No results for input(s): CHOL, HDL, LDLCALC, TRIG, CHOLHDL, LDLDIRECT in the last 72 hours. Thyroid Function Tests: No results for input(s): TSH, T4TOTAL, FREET4, T3FREE, THYROIDAB in the last 72 hours. Anemia Panel: No results for input(s): VITAMINB12, FOLATE, FERRITIN, TIBC, IRON, RETICCTPCT in the last 72 hours. Urine analysis:    Component Value Date/Time  COLORURINE YELLOW 03/28/2021 1444   APPEARANCEUR CLEAR 03/28/2021 1444   LABSPEC 1.041 (H) 03/28/2021 1444   PHURINE 6.0 03/28/2021 1444   GLUCOSEU NEGATIVE 03/28/2021 1444   HGBUR NEGATIVE 03/28/2021 1444   BILIRUBINUR NEGATIVE 03/28/2021 1444   KETONESUR NEGATIVE 03/28/2021 1444   PROTEINUR NEGATIVE 03/28/2021 1444   NITRITE NEGATIVE 03/28/2021 1444   LEUKOCYTESUR NEGATIVE 03/28/2021 1444   Sepsis Labs: (procalcitonin:4,lacticidven:4)  ) Recent Results (from the past 240 hour(s))  Culture, blood (Routine X 2) w Reflex to ID Panel     Status: None   Collection Time: 03/25/21  5:04 PM   Specimen: BLOOD  Result Value Ref Range Status   Specimen Description   Final    BLOOD RIGHT ANTECUBITAL Performed at [facility], [address], [place], [place] [address]    Special Requests   Final    BOTTLES DRAWN AEROBIC AND ANAEROBIC Blood Culture adequate volume Performed at [facility], [address] [place], [place] [address]    Culture   Final    NO GROWTH 5 DAYS Performed at [facility], [address] [place], [place] [address]    Report Status 03/30/2021 FINAL  Final  Culture, blood (Routine X 2) w Reflex to ID Panel     Status: None   Collection Time: 03/25/21  5:12 PM   Specimen: BLOOD  Result Value Ref Range Status   Specimen Description   Final    BLOOD LEFT ANTECUBITAL Performed at [facility], [address] [place], [place] [address]    Special Requests   Final    BOTTLES DRAWN AEROBIC AND ANAEROBIC Blood Culture adequate volume Performed at [facility], [address] [place], [place] [address]    Culture   Final    NO GROWTH 5 DAYS Performed at [facility], [address] [place], [place] [address]    Report Status 03/30/2021 FINAL  Final  Urine Culture     Status: None   Collection Time: 03/28/21  2:44 PM   Specimen: In/Out Cath Urine  Result Value Ref Range Status   Specimen Description IN/OUT CATH URINE  Final   Special Requests NONE  Final   Culture   Final    NO GROWTH Performed at [facility], [address], [place], [place] [address]    Report Status 03/29/2021 FINAL  Final  Blood Culture (routine x 2)     Status: None (Preliminary result)   Collection Time: 03/28/21  2:45 PM   Specimen: BLOOD  Result Value Ref Range Status   Specimen Description BLOOD RIGHT UPPER ARM  Final   Special Requests   Final    BOTTLES DRAWN AEROBIC AND ANAEROBIC Blood Culture results may not be optimal due to an excessive volume of blood received in culture bottles   Culture   Final    NO GROWTH 2 DAYS Performed at [facility], [address], [place], [place] [address]    Report Status PENDING  Incomplete  Blood Culture (routine x 2)     Status: Abnormal (Preliminary result)   Collection Time: 03/28/21  2:50 PM   Specimen: BLOOD  Result Value Ref Range Status   Specimen Description BLOOD LEFT ANTECUBITAL  Final   Special Requests   Final    BOTTLES DRAWN AEROBIC AND ANAEROBIC Blood Culture results may not be optimal due to an excessive volume of blood received  in culture bottles   Culture  Setup Time   Final    GRAM POSITIVE COCCI IN CLUSTERS IN BOTH AEROBIC AND ANAEROBIC BOTTLES CRITICAL RESULT CALLED TO, READ BACK BY AND VERIFIED WITH: PHARMD E.SINCLAIR AT 1255 ON 03/29/2021 BY T.SAAD. Performed at [facility]  [facility], [address], [place], [place] [address]    Culture STAPHYLOCOCCUS EPIDERMIDIS (A)  Final   Report Status PENDING  Incomplete  Blood Culture ID Panel (Reflexed)     Status: Abnormal   Collection Time: 03/28/21  2:50 PM  Result Value Ref Range Status   Enterococcus faecalis NOT DETECTED NOT DETECTED Final   Enterococcus Faecium NOT DETECTED NOT DETECTED Final   Listeria monocytogenes NOT DETECTED NOT DETECTED Final   Staphylococcus species DETECTED (A) NOT DETECTED Final    Comment: CRITICAL RESULT CALLED TO, READ BACK BY AND VERIFIED WITH: PHARMD E.SINCLAIR AT 1255 ON 03/29/2021 BY T.SAAD.    Staphylococcus aureus (BCID) NOT DETECTED NOT DETECTED Final   Staphylococcus epidermidis DETECTED (A) NOT DETECTED Final    Comment: CRITICAL RESULT CALLED TO, READ BACK BY AND VERIFIED WITH: PHARMD E.SINCLAIR AT 1255 ON 03/29/2021 BY T.SAAD.    Staphylococcus lugdunensis NOT DETECTED NOT DETECTED Final   Streptococcus species NOT DETECTED NOT DETECTED Final   Streptococcus agalactiae NOT DETECTED NOT DETECTED Final   Streptococcus pneumoniae NOT DETECTED NOT DETECTED Final   Streptococcus pyogenes NOT DETECTED NOT DETECTED Final   A.calcoaceticus-baumannii NOT DETECTED NOT DETECTED Final   Bacteroides fragilis NOT DETECTED NOT DETECTED Final   Enterobacterales NOT DETECTED NOT DETECTED Final   Enterobacter cloacae complex NOT DETECTED NOT DETECTED Final   Escherichia coli NOT DETECTED NOT DETECTED Final   Klebsiella aerogenes NOT DETECTED NOT DETECTED Final   Klebsiella oxytoca NOT DETECTED NOT DETECTED Final   Klebsiella pneumoniae NOT DETECTED NOT DETECTED Final   Proteus species NOT DETECTED NOT DETECTED Final   Salmonella species NOT DETECTED NOT DETECTED Final   Serratia marcescens NOT DETECTED NOT DETECTED Final   Haemophilus influenzae NOT DETECTED NOT DETECTED Final   Neisseria meningitidis NOT DETECTED NOT DETECTED Final   Pseudomonas aeruginosa NOT DETECTED NOT DETECTED Final    Stenotrophomonas maltophilia NOT DETECTED NOT DETECTED Final   Candida albicans NOT DETECTED NOT DETECTED Final   Candida auris NOT DETECTED NOT DETECTED Final   Candida glabrata NOT DETECTED NOT DETECTED Final   Candida krusei NOT DETECTED NOT DETECTED Final   Candida parapsilosis NOT DETECTED NOT DETECTED Final   Candida tropicalis NOT DETECTED NOT DETECTED Final   Cryptococcus neoformans/gattii NOT DETECTED NOT DETECTED Final   Methicillin resistance mecA/C NOT DETECTED NOT DETECTED Final    Comment: Performed at [facility], [address], [place], [place] [address]  CSF culture w Stat Gram Stain     Status: None (Preliminary result)   Collection Time: 03/29/21  2:53 PM   Specimen: CSF; Cerebrospinal Fluid  Result Value Ref Range Status   Specimen Description CSF  Final   Special Requests NONE  Final   Gram Stain   Final    WBC PRESENT,BOTH PMN AND MONONUCLEAR NO ORGANISMS SEEN CYTOSPIN SMEAR    Culture   Final    NO GROWTH < 24 HOURS Performed at [facility], [address], [place], [place] [address]    Report Status PENDING  Incomplete  Resp Panel by RT-PCR (Flu A&B, Covid) Nasopharyngeal Swab     Status: None   Collection Time: 03/29/21  3:12 PM  Specimen: Nasopharyngeal Swab; Nasopharyngeal(NP) swabs in vial transport medium  Result Value Ref Range Status   SARS Coronavirus 2 by RT PCR NEGATIVE NEGATIVE Final    Comment: (NOTE) SARS-CoV-2 target nucleic acids are NOT DETECTED.  The SARS-CoV-2 RNA is generally detectable in upper respiratory specimens during the acute phase of infection. The lowest concentration of SARS-CoV-2 viral copies this assay can detect is 138 copies/mL. A negative result does not preclude SARS-Cov-2 infection and should not be used as the sole basis for treatment or other patient management decisions. A negative result may occur with  improper specimen collection/handling, submission of specimen other than nasopharyngeal  swab, presence of viral mutation(s) within the areas targeted by this assay, and inadequate number of viral copies(<138 copies/mL). A negative result must be combined with clinical observations, patient history, and epidemiological information. The expected result is Negative.  Fact Sheet for Patients:  BloggerCourse.com  Fact Sheet for Healthcare Providers:  SeriousBroker.it  This test is no t yet approved or cleared by the [place] FDA and  has been authorized for detection and/or diagnosis of SARS-CoV-2 by FDA under an Emergency Use Authorization (EUA). This EUA will remain  in effect (meaning this test can be used) for the duration of the COVID-19 declaration under Section 564(b)(1) of the Act, 21 U.S.C.section 360bbb-3(b)(1), unless the authorization is terminated  or revoked sooner.       Influenza A by PCR NEGATIVE NEGATIVE Final   Influenza B by PCR NEGATIVE NEGATIVE Final    Comment: (NOTE) The Xpert Xpress SARS-CoV-2/FLU/RSV plus assay is intended as an aid in the diagnosis of influenza from Nasopharyngeal swab specimens and should not be used as a sole basis for treatment. Nasal washings and aspirates are unacceptable for Xpert Xpress SARS-CoV-2/FLU/RSV testing.  Fact Sheet for Patients: BloggerCourse.com  Fact Sheet for Healthcare Providers: SeriousBroker.it  This test is not yet approved or cleared by the [place] FDA and has been authorized for detection and/or diagnosis of SARS-CoV-2 by FDA under an Emergency Use Authorization (EUA). This EUA will remain in effect (meaning this test can be used) for the duration of the COVID-19 declaration under Section 564(b)(1) of the Act, 21 U.S.C. section 360bbb-3(b)(1), unless the authorization is terminated or revoked.  Performed at [facility], [address] [place], [place] [address]        Studies: EEG adult  Result Date: 2021-04-28 Charlsie Quest, MD     04-28-2021  2:21 PM Patient Name: Kaceton Vieau MRN: [id number] Epilepsy Attending: Charlsie Quest Referring Physician/Provider: Leda Gauze, NP Date: 2021/04/28 Duration: 22.38 mins Patient history: 35 y.o. male with PMH significant for everyday smoker who presents with multiple focal seizures with retained awareness and 2 focal seizures with secondary generalization. EEG to evaluate for seizure. Level of alertness: Awake, asleep AEDs during EEG study: LEV Technical aspects: This EEG study was done with scalp electrodes positioned according to the 10-20 International system of electrode placement. Electrical activity was acquired at a sampling rate of 500Hz  and reviewed with a high frequency filter of 70Hz  and a low frequency filter of 1Hz . EEG data were recorded continuously and digitally stored. Description: The posterior dominant rhythm consists of 9 Hz activity of moderate voltage (25-35 uV) seen predominantly in posterior head regions, asymmetric ( left<right)  and reactive to eye opening and eye closing.Sleep was characterized by vertex waves, sleep spindles (12 to 14 Hz), maximal frontocentral region. EEG showed intermittent generalized and lateralized left hemisphere 3  to 6 Hz theta-delta slowing.  Hyperventilation and photic stimulation were not performed.   ABNORMALITY - Intermittent slow, generalized and lateralized left hemisphere - Background asymmetry, left<right IMPRESSION: This study is suggestive of cortical dysfunction arising from left hemisphere, nonspecific etiology but could be secondary to underlying structural abnormality. Additionally there is mild diffuse encephalopathy, nonspecific etiology. No seizures or epileptiform discharges were seen throughout the recording. Priyanka Annabelle Harman    Scheduled Meds:  Chlorhexidine Gluconate Cloth  6 each Topical Daily   levETIRAcetam  500 mg Oral BID    tamsulosin  0.4 mg Oral Daily    Continuous Infusions:  sodium chloride 125 mL/hr at 03/30/21 0603   acyclovir 165 mL/hr at 03/30/21 0601   ampicillin (OMNIPEN) IV 2 g (03/30/21 0609)   cefTRIAXone (ROCEPHIN)  IV Stopped (03/30/21 0533)   vancomycin 1,000 mg (03/30/21 0636)     LOS: 1 day     Myrtie Neither, MD [facility]  To reach me or the doctor on call, go to: [email or web address] Password [email or web address]  03/30/2021, 10:46 AM

## 2021-03-30 NOTE — Progress Notes (Signed)
   Progress Note  Patient Name: James Obrien Date of Encounter: 03/30/2021  Primary Cardiologist: Nicki Guadalajara, MD  I reviewed the cardiology consultation from yesterday. On follow-up telemetry does show sinus bradycardia at times with heart rate high 40's to mid 50's, but no pauses or evidence of heart block and otherwise hemodynamically stable.   Pertinent lab work includes potassium 3.8, BUN 18, creatinine 1.1, Hgb 11.2, platelets 222.  I personally reviewed her ECG today which shows sinus bradycardia at 54 bpm with R' in leads V1 and V2, normal intervals.  Echocardiogram is pending at this time.  Will follow for now, may ultimately need to consider sleep study to assess for OSA given nocturnal bradycardia.  Signed, Nona Dell, MD  03/30/2021, 9:37 AM

## 2021-03-30 NOTE — Plan of Care (Signed)
Neurology plan of care       No charge note.   L spine and Tspine MRI resulted.   Lspine:  - Longitudinally extensive cord signal abnormality/myelitis. -No hematoma or other complication related to lumbar puncture.  Tspine: -Small disc protrusions at T7-8 and T8-9 which contact but do not compress the cord.  Dr. Amada Jupiter is calling ID.  Rest of plan to be charted later.   Jimmye Norman, MSN, APN-BC Neurology Nurse Practitioner Pager [phone]

## 2021-03-30 NOTE — Plan of Care (Signed)
NP spoke to patient's wife in private about STD. She is aware that he has gonorrhea with + testing here. Patient went to [facility] on 03/25/21 for the rash. He was tested there and + for gonorrhea. Patient told wife he was negative at [facility]. Antibiotics were prescribed, but unknown if he took them. I explained to wife that she needs to be tested for all STDs, not just for gonorrhea. [facility]. NP asked wife if she would like to talk to a chaplain because this situation is obviously resulting in stress , fear, and sadness. She agreed to chaplain.   Jimmye Norman, MSN, APN-BC Neurology Nurse Practitioner Pager [phone]

## 2021-03-30 NOTE — Progress Notes (Signed)
[facility]   Reason for visit: Follow up on meningitis  Interval History: CSF studies with 104 WBC, 64 % lymphs, WBC 10.6.  progressively less movement MRI of spine noted and has longitudinally extensive cord signal myelitis Wife at bedside  Physical Exam: Constitutional:  Vitals:   03/30/21 0749 03/30/21 1145  BP: 121/64 (!) 140/91  Pulse: (!) 57 69  Resp:  14  Temp: 97.8 F (36.6 C) 98.7 F (37.1 C)  SpO2: 97% 100%   patient appears in NAD Respiratory: Normal respiratory effort; CTA B Cardiovascular: RRR GI: soft, nt, nd  Review of Systems: Constitutional: positive for fevers Gastrointestinal: negative for diarrhea Musculoskeletal: negative for myalgias and arthralgias Neurological: negative for headaches  Lab Results  Component Value Date   WBC 10.6 (H) 03/30/2021   HGB 11.8 (L) 03/30/2021   HCT 37.1 (L) 03/30/2021   MCV 94.2 03/30/2021   PLT 215 03/30/2021    Lab Results  Component Value Date   CREATININE 1.10 03/30/2021   BUN 7 03/30/2021   NA 137 03/30/2021   K 3.8 03/30/2021   CL 109 03/30/2021   CO2 18 (L) 03/30/2021    Lab Results  Component Value Date   ALT 25 03/28/2021   AST 36 03/28/2021   ALKPHOS 39 03/28/2021     Microbiology: Recent Results (from the past 240 hour(s))  Culture, blood (Routine X 2) w Reflex to ID Panel     Status: None   Collection Time: 03/25/21  5:04 PM   Specimen: BLOOD  Result Value Ref Range Status   Specimen Description   Final    BLOOD RIGHT ANTECUBITAL Performed at [facility], [address] [place], [place] [address]    Special Requests   Final    BOTTLES DRAWN AEROBIC AND ANAEROBIC Blood Culture adequate volume Performed at [facility], [address] [place], [place] [address]    Culture   Final    NO GROWTH 5 DAYS Performed at [facility], [address] [place], [place] [address]    Report Status 03/30/2021 FINAL  Final  Culture, blood  (Routine X 2) w Reflex to ID Panel     Status: None   Collection Time: 03/25/21  5:12 PM   Specimen: BLOOD  Result Value Ref Range Status   Specimen Description   Final    BLOOD LEFT ANTECUBITAL Performed at [facility], [address] [place], [place] [address]    Special Requests   Final    BOTTLES DRAWN AEROBIC AND ANAEROBIC Blood Culture adequate volume Performed at [facility], [address] [place], [place] [address]    Culture   Final    NO GROWTH 5 DAYS Performed at [facility], [address] [place], [place] [address]    Report Status 03/30/2021 FINAL  Final  Urine Culture     Status: None   Collection Time: 03/28/21  2:44 PM   Specimen: In/Out Cath Urine  Result Value Ref Range Status   Specimen Description IN/OUT CATH URINE  Final   Special Requests NONE  Final   Culture   Final    NO GROWTH Performed at [facility], [address] [place], [place] [address]    Report Status 03/29/2021 FINAL  Final  Blood Culture (routine x 2)     Status: None (Preliminary result)   Collection Time: 03/28/21  2:45  PM   Specimen: BLOOD  Result Value Ref Range Status   Specimen Description BLOOD RIGHT UPPER ARM  Final   Special Requests   Final    BOTTLES DRAWN AEROBIC AND ANAEROBIC Blood Culture results may not be optimal due to an excessive volume of blood received in culture bottles   Culture   Final    NO GROWTH 2 DAYS Performed at [facility], [address] [place], [place] [address]    Report Status PENDING  Incomplete  Blood Culture (routine x 2)     Status: Abnormal (Preliminary result)   Collection Time: 03/28/21  2:50 PM   Specimen: BLOOD  Result Value Ref Range Status   Specimen Description BLOOD LEFT ANTECUBITAL  Final   Special Requests   Final    BOTTLES DRAWN AEROBIC AND ANAEROBIC Blood Culture results may not be optimal due to an excessive volume of blood received in culture bottles   Culture  Setup Time   Final     GRAM POSITIVE COCCI IN CLUSTERS IN BOTH AEROBIC AND ANAEROBIC BOTTLES CRITICAL RESULT CALLED TO, READ BACK BY AND VERIFIED WITH: PHARMD E.SINCLAIR AT 1255 ON 03/29/2021 BY T.SAAD. Performed at [facility], [address] [place], [place] [address]    Culture STAPHYLOCOCCUS EPIDERMIDIS (A)  Final   Report Status PENDING  Incomplete  Blood Culture ID Panel (Reflexed)     Status: Abnormal   Collection Time: 03/28/21  2:50 PM  Result Value Ref Range Status   Enterococcus faecalis NOT DETECTED NOT DETECTED Final   Enterococcus Faecium NOT DETECTED NOT DETECTED Final   Listeria monocytogenes NOT DETECTED NOT DETECTED Final   Staphylococcus species DETECTED (A) NOT DETECTED Final    Comment: CRITICAL RESULT CALLED TO, READ BACK BY AND VERIFIED WITH: PHARMD E.SINCLAIR AT 1255 ON 03/29/2021 BY T.SAAD.    Staphylococcus aureus (BCID) NOT DETECTED NOT DETECTED Final   Staphylococcus epidermidis DETECTED (A) NOT DETECTED Final    Comment: CRITICAL RESULT CALLED TO, READ BACK BY AND VERIFIED WITH: PHARMD E.SINCLAIR AT 1255 ON 03/29/2021 BY T.SAAD.    Staphylococcus lugdunensis NOT DETECTED NOT DETECTED Final   Streptococcus species NOT DETECTED NOT DETECTED Final   Streptococcus agalactiae NOT DETECTED NOT DETECTED Final   Streptococcus pneumoniae NOT DETECTED NOT DETECTED Final   Streptococcus pyogenes NOT DETECTED NOT DETECTED Final   A.calcoaceticus-baumannii NOT DETECTED NOT DETECTED Final   Bacteroides fragilis NOT DETECTED NOT DETECTED Final   Enterobacterales NOT DETECTED NOT DETECTED Final   Enterobacter cloacae complex NOT DETECTED NOT DETECTED Final   Escherichia coli NOT DETECTED NOT DETECTED Final   Klebsiella aerogenes NOT DETECTED NOT DETECTED Final   Klebsiella oxytoca NOT DETECTED NOT DETECTED Final   Klebsiella pneumoniae NOT DETECTED NOT DETECTED Final   Proteus species NOT DETECTED NOT DETECTED Final   Salmonella species NOT DETECTED NOT DETECTED Final   Serratia  marcescens NOT DETECTED NOT DETECTED Final   Haemophilus influenzae NOT DETECTED NOT DETECTED Final   Neisseria meningitidis NOT DETECTED NOT DETECTED Final   Pseudomonas aeruginosa NOT DETECTED NOT DETECTED Final   Stenotrophomonas maltophilia NOT DETECTED NOT DETECTED Final   Candida albicans NOT DETECTED NOT DETECTED Final   Candida auris NOT DETECTED NOT DETECTED Final   Candida glabrata NOT DETECTED NOT DETECTED Final   Candida krusei NOT DETECTED NOT DETECTED Final   Candida parapsilosis NOT DETECTED NOT DETECTED Final   Candida tropicalis NOT DETECTED NOT DETECTED Final   Cryptococcus neoformans/gattii NOT DETECTED  NOT DETECTED Final   Methicillin resistance mecA/C NOT DETECTED NOT DETECTED Final    Comment: Performed at [facility], [address], [place], [place] [address]  CSF culture w Stat Gram Stain     Status: None (Preliminary result)   Collection Time: 03/29/21  2:53 PM   Specimen: CSF; Cerebrospinal Fluid  Result Value Ref Range Status   Specimen Description CSF  Final   Special Requests NONE  Final   Gram Stain   Final    WBC PRESENT,BOTH PMN AND MONONUCLEAR NO ORGANISMS SEEN CYTOSPIN SMEAR    Culture   Final    NO GROWTH < 24 HOURS Performed at [facility], [address] [place], [place] [address]    Report Status PENDING  Incomplete  Resp Panel by RT-PCR (Flu A&B, Covid) Nasopharyngeal Swab     Status: None   Collection Time: 03/29/21  3:12 PM   Specimen: Nasopharyngeal Swab; Nasopharyngeal(NP) swabs in vial transport medium  Result Value Ref Range Status   SARS Coronavirus 2 by RT PCR NEGATIVE NEGATIVE Final    Comment: (NOTE) SARS-CoV-2 target nucleic acids are NOT DETECTED.  The SARS-CoV-2 RNA is generally detectable in upper respiratory specimens during the acute phase of infection. The lowest concentration of SARS-CoV-2 viral copies this assay can detect is 138 copies/mL. A negative result does not preclude SARS-Cov-2 infection and  should not be used as the sole basis for treatment or other patient management decisions. A negative result may occur with  improper specimen collection/handling, submission of specimen other than nasopharyngeal swab, presence of viral mutation(s) within the areas targeted by this assay, and inadequate number of viral copies(<138 copies/mL). A negative result must be combined with clinical observations, patient history, and epidemiological information. The expected result is Negative.  Fact Sheet for Patients:  BloggerCourse.com  Fact Sheet for Healthcare Providers:  SeriousBroker.it  This test is no t yet approved or cleared by the [place] FDA and  has been authorized for detection and/or diagnosis of SARS-CoV-2 by FDA under an Emergency Use Authorization (EUA). This EUA will remain  in effect (meaning this test can be used) for the duration of the COVID-19 declaration under Section 564(b)(1) of the Act, 21 U.S.C.section 360bbb-3(b)(1), unless the authorization is terminated  or revoked sooner.       Influenza A by PCR NEGATIVE NEGATIVE Final   Influenza B by PCR NEGATIVE NEGATIVE Final    Comment: (NOTE) The Xpert Xpress SARS-CoV-2/FLU/RSV plus assay is intended as an aid in the diagnosis of influenza from Nasopharyngeal swab specimens and should not be used as a sole basis for treatment. Nasal washings and aspirates are unacceptable for Xpert Xpress SARS-CoV-2/FLU/RSV testing.  Fact Sheet for Patients: BloggerCourse.com  Fact Sheet for Healthcare Providers: SeriousBroker.it  This test is not yet approved or cleared by the [place] FDA and has been authorized for detection and/or diagnosis of SARS-CoV-2 by FDA under an Emergency Use Authorization (EUA). This EUA will remain in effect (meaning this test can be used) for the duration of the COVID-19 declaration  under Section 564(b)(1) of the Act, 21 U.S.C. section 360bbb-3(b)(1), unless the authorization is terminated or revoked.  Performed at [facility], 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  Differential is broad and includes viral etiologies including West Nile, HSV, enterovirus.  Syphilis or acute HIV.   Less likely Tb  Other concerns include autoimmune/inflammatory.  Post infectious.   Discussed with neurology and I agree with adding steroids.   Will continue with broad antibiotic coverage for now pending work up.  2.  Gonorrhea - s/p treatment and now the wife is aware after discussion with neurology.    3.  Myelitis - noted on MRI and same unknown etiology for #1.  On treatment and starting steroids.

## 2021-03-31 DIAGNOSIS — R569 Unspecified convulsions: Secondary | ICD-10-CM | POA: Diagnosis not present

## 2021-03-31 DIAGNOSIS — G0491 Myelitis, unspecified: Secondary | ICD-10-CM

## 2021-03-31 LAB — CBC WITH DIFFERENTIAL/PLATELET
Abs Immature Granulocytes: 0.03 10*3/uL (ref 0.00–0.07)
Basophils Absolute: 0 10*3/uL (ref 0.0–0.1)
Basophils Relative: 0 %
Eosinophils Absolute: 0 10*3/uL (ref 0.0–0.5)
Eosinophils Relative: 0 %
HCT: 39 % (ref 39.0–52.0)
Hemoglobin: 13.2 g/dL (ref 13.0–17.0)
Immature Granulocytes: 0 %
Lymphocytes Relative: 15 %
Lymphs Abs: 1.6 10*3/uL (ref 0.7–4.0)
MCH: 30.9 pg (ref 26.0–34.0)
MCHC: 33.8 g/dL (ref 30.0–36.0)
MCV: 91.3 fL (ref 80.0–100.0)
Monocytes Absolute: 1 10*3/uL (ref 0.1–1.0)
Monocytes Relative: 10 %
Neutro Abs: 8 10*3/uL — ABNORMAL HIGH (ref 1.7–7.7)
Neutrophils Relative %: 75 %
Platelets: 259 10*3/uL (ref 150–400)
RBC: 4.27 MIL/uL (ref 4.22–5.81)
RDW: 13.2 % (ref 11.5–15.5)
WBC: 10.7 10*3/uL — ABNORMAL HIGH (ref 4.0–10.5)
nRBC: 0 % (ref 0.0–0.2)

## 2021-03-31 LAB — BASIC METABOLIC PANEL
Anion gap: 10 (ref 5–15)
BUN: 8 mg/dL (ref 6–20)
CO2: 22 mmol/L (ref 22–32)
Calcium: 8.7 mg/dL — ABNORMAL LOW (ref 8.9–10.3)
Chloride: 107 mmol/L (ref 98–111)
Creatinine, Ser: 0.74 mg/dL (ref 0.61–1.24)
GFR, Estimated: >60 mL/min (ref >60)
Glucose, Bld: 120 mg/dL — ABNORMAL HIGH (ref 70–99)
Potassium: 4.2 mmol/L (ref 3.5–5.1)
Sodium: 139 mmol/L (ref 135–145)

## 2021-03-31 LAB — CULTURE, BLOOD (ROUTINE X 2)

## 2021-03-31 NOTE — Progress Notes (Addendum)
PROGRESS NOTE  James Obrien ZOX:[id number] DOB: 11-Oct-1985 DOA: 03/28/2021 PCP: Pcp, No  HPI/Recap of past 24 hours: This is a 35 year old male with no previous significant medical history except for tobacco abuse since the age of 75 he has a 17-year pack history admitted for clonic tonic seizure.  He had a history of spider bite in the groin and recently was treated for gonorrhea.  In the emergency room he was noted to have a heart rate in the upper 30s and was given atropine.  Patient underwent lumbar puncture March 29, 2021   Subjective: Patient seen and examined at bedside. He denies any new complaint  March 31, 2021 Patient seen and examined at bedside He does not have any fever his family is at bedside and they have said that he is communicating better Still a little slow to speak but he is able to answer questions and was able to name objects Patient denies feeling depressed down and hopeless.  He stated he wished he would he would like to get up and move around, he is tired of lying in bed.  Also he requested to speak with the chaplain  Assessment/Plan: Principal Problem:   Seizure [facility]) Active Problems:   Gonorrhea   Cellulitis   Bradycardia   1.  Seizure disorder.  His MRI with and without contrast with abnormal He was started on Keppra by neurology EEG also was recommended and it was completed it did not show any seizure focus or epileptiform discharge he does have mild diffuse encephalopathy He had a lumbar puncture with did not show any organism and it was clear he had elevated WBC however.  His CSF protein was also elevated His T-max is 102  2.  Bradycardia with heart rate between 37-38 while sleeping in the ED cardiology was consulted   and they are following 2D echo was done and it was read as normal.  Cardiology stated they will follow-up distantly  3.  Lactic acidosis resolving likely due to seizure  4.  Left groin cellulitis CT shows improving  cellulitic changes Mild elevated WBC Continue ceftriaxone Follow-up blood culture blood culture shows staph epididymis Infectious disease has discontinued vancomycin and stated to continue with ceftriaxone  5.  Recent gonorrhea infection with a positive test on March 25, 2021 and was treated with ceftriaxone  #6 encephalitis/myelitis.  Mentation is improved and there is no source of bacterial infection his CSF culture is negative Infectious disease has advised to discontinue vancomycin and ampicillin To continue with acyclovir To continue with ceftriaxone To continue with steroids  7.  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  Febrile illness his fever is down he is not febrile today T-max was 99.4  9.  Slow mentation might be due to the seizure and encephalitis.  I do not think he is depressed and he did deny depression on direct questioning is probably due to his encephalitis Code Status: Full  Severity of Illness: The appropriate patient status for this patient is INPATIENT. Inpatient status is judged to be reasonable and necessary in order to provide the required intensity of service to ensure the patient's safety. The patient's presenting symptoms, physical exam findings, and initial radiographic and laboratory data in the context of their chronic comorbidities is felt to place them at high risk for further clinical deterioration. Furthermore, it is not anticipated that the patient will be medically stable for discharge from the hospital within 2  midnights of admission. The following factors support the patient status of inpatient.  She is disorder left upper arm weakness work-up in progress   * I certify that at the point of admission it is my clinical judgment that the patient will require inpatient hospital care spanning beyond 2 midnights from the point of admission due to high intensity of service, high risk for further  deterioration and high frequency of surveillance required.*   Family Communication: Discussed with his wife Shanda Bumps at bedside 10/2 father Romeo Apple at bedside , gave update  also wife Shanda Bumps, who stated that there is improvement he is talking able to name objects and able to carry a conversation and he is more awake  Disposition Plan: Home Status is: Inpatient   Dispo: The patient is from: Home              Anticipated d/c is to:               Anticipated d/c date is:               Patient currently not medically stable for discharge  Consultants: Neurology Cardiology Infectious disease  Procedures: Lumbar puncture EEG  Antimicrobials: Ampicillin Ceftriaxone  Acyclovir  DVT prophylaxis: Lovenox   Objective: Vitals:   03/30/21 2331 03/31/21 0004 03/31/21 0419 03/31/21 0742  BP: 127/65 121/67 120/63 123/65  Pulse: 77 79 (!) 54 (!) 53  Resp: Temp: 99.4 F (37.4 C) 98.6 F (37 C) (!) 97.5 F (36.4 C) 97.9 F (36.6 C)  TempSrc: Oral Oral Oral Oral  SpO2: 96% 100% 96%   Weight:      Height:        Intake/Output Summary (Last 24 hours) at 03/31/2021 1002 Last data filed at 03/31/2021 0400 Gross per 24 hour  Intake 3476.65 ml  Output 5030 ml  Net -1553.35 ml    Filed Weights   03/28/21 1500  Weight: 74.8 kg   Body mass index is 23.01 kg/m.  Exam:  General: 35 y.o. year-old male well developed well nourished in no acute distress.  Alert and oriented he was asleep when I walked in and it took him a little while to get reoriented.  He is T-max is 102.  He had a temperature of 103 which required a lumbar puncture he is afebrile Cardiovascular: Regular rate and rhythm with no rubs or gallops.  No thyromegaly or JVD noted.   Respiratory: Clear to auscultation with no wheezes or rales. Good inspiratory effort. Abdomen: Soft nontender nondistended with normal bowel sounds x4 quadrants. Musculoskeletal: No lower extremity edema. 2/4 pulses in all 4  extremities. Skin: Cellulitis of groin, Psychiatry: Mood is appropriate for condition and setting affect is flat but patient denies depression. Neurology: There is weakness of the left upper extremity particularly to flexion at the wrist and grip Also he is slow to answer questions, more alert  today however    Data Reviewed: CBC: Recent Labs  Lab 03/25/21 1705 03/28/21 1444 03/28/21 1525 03/29/21 0438 03/30/21 0942  WBC 7.2 10.8*  --  10.1 10.6*  NEUTROABS 4.4 7.8*  --   --  7.6  HGB 14.3 12.9* 11.6* 11.2* 11.8*  HCT 41.1 39.2 34.0* 34.8* 37.1*  MCV 88.8 92.9  --  94.1 94.2  PLT 181 214  --  222 215    Basic Metabolic Panel: Recent Labs  Lab 03/25/21 1705 03/28/21 1444 03/28/21 1525 03/28/21 1930 03/29/21 0438 03/30/21 0135  NA 135 138  140  --  138 137  K 3.7 4.8 4.3  --  3.4* 3.8  CL 99 104 104  --  109 109  CO2 25 23  --   --  21* 18*  GLUCOSE 98 103* 102*  --  100* 97  BUN 14 13 12   --  8 7  CREATININE 0.92 1.27* 1.10  --  1.00 1.10  CALCIUM 9.3 8.7*  --   --  8.4* 8.0*  MG  --   --   --  2.3  --  1.9    GFR: Estimated Creatinine Clearance: 101.1 mL/min (by C-G formula based on SCr of 1.1 mg/dL). Liver Function Tests: Recent Labs  Lab 03/25/21 1705 03/28/21 1444  AST 35 36  ALT 31 25  ALKPHOS 45 39  BILITOT 0.7 0.4  PROT 8.1 6.9  ALBUMIN 4.3 3.6    No results for input(s): LIPASE, AMYLASE in the last 168 hours. No results for input(s): AMMONIA in the last 168 hours. Coagulation Profile: Recent Labs  Lab 03/28/21 1444  INR 1.1    Cardiac Enzymes: No results for input(s): CKTOTAL, CKMB, CKMBINDEX, TROPONINI in the last 168 hours. BNP (last 3 results) No results for input(s): PROBNP in the last 8760 hours. HbA1C: No results for input(s): HGBA1C in the last 72 hours. CBG: No results for input(s): GLUCAP in the last 168 hours. Lipid Profile: No results for input(s): CHOL, HDL, LDLCALC, TRIG, CHOLHDL, LDLDIRECT in the last 72 hours. Thyroid  Function Tests: No results for input(s): TSH, T4TOTAL, FREET4, T3FREE, THYROIDAB in the last 72 hours. Anemia Panel: No results for input(s): VITAMINB12, FOLATE, FERRITIN, TIBC, IRON, RETICCTPCT in the last 72 hours. Urine analysis:    Component Value Date/Time   COLORURINE YELLOW 03/28/2021 1444   APPEARANCEUR CLEAR 03/28/2021 1444   LABSPEC 1.041 (H) 03/28/2021 1444   PHURINE 6.0 03/28/2021 1444   GLUCOSEU NEGATIVE 03/28/2021 1444   HGBUR NEGATIVE 03/28/2021 1444   BILIRUBINUR NEGATIVE 03/28/2021 1444   KETONESUR NEGATIVE 03/28/2021 1444   PROTEINUR NEGATIVE 03/28/2021 1444   NITRITE NEGATIVE 03/28/2021 1444   LEUKOCYTESUR NEGATIVE 03/28/2021 1444   Sepsis Labs: @LABRCNTIP (procalcitonin:4,lacticidven:4)  ) Recent Results (from the past 240 hour(s))  Culture, blood (Routine X 2) w Reflex to ID Panel     Status: None   Collection Time: 03/25/21  5:04 PM   Specimen: BLOOD  Result Value Ref Range Status   Specimen Description   Final    BLOOD RIGHT ANTECUBITAL Performed at [facility], [address] [place], [address] [place]    Special Requests   Final    BOTTLES DRAWN AEROBIC AND ANAEROBIC Blood Culture adequate volume Performed at [facility], [address] [place], [address] [place]    Culture   Final    NO GROWTH 5 DAYS Performed at [facility], [address] [place], [address] [place]    Report Status 03/30/2021 FINAL  Final  Culture, blood (Routine X 2) w Reflex to ID Panel     Status: None   Collection Time: 03/25/21  5:12 PM   Specimen: BLOOD  Result Value Ref Range Status   Specimen Description   Final    BLOOD LEFT ANTECUBITAL Performed at [facility], [address] [place], [address] [place]    Special Requests   Final    BOTTLES DRAWN AEROBIC AND ANAEROBIC Blood Culture adequate volume Performed at [facility], [address] [place], [place] [address]    Culture   Final    NO GROWTH 5  DAYS Performed at [facility], [address], [place], [place] [address]    Report Status 03/30/2021 FINAL  Final  Urine Culture     Status: None   Collection Time: 03/28/21  2:44 PM   Specimen: In/Out Cath Urine  Result Value Ref Range Status   Specimen Description IN/OUT CATH URINE  Final   Special Requests NONE  Final   Culture   Final    NO GROWTH Performed at [facility], [address] [place], [place] [address]    Report Status 03/29/2021 FINAL  Final  Blood Culture (routine x 2)     Status: None (Preliminary result)   Collection Time: 03/28/21  2:45 PM   Specimen: BLOOD  Result Value Ref Range Status   Specimen Description BLOOD RIGHT UPPER ARM  Final   Special Requests   Final    BOTTLES DRAWN AEROBIC AND ANAEROBIC Blood Culture results may not be optimal due to an excessive volume of blood received in culture bottles   Culture   Final    NO GROWTH 3 DAYS Performed at [facility], [address], [place], [place] [address]    Report Status PENDING  Incomplete  Blood Culture (routine x 2)     Status: Abnormal   Collection Time: 03/28/21  2:50 PM   Specimen: BLOOD  Result Value Ref Range Status   Specimen Description BLOOD LEFT ANTECUBITAL  Final   Special Requests   Final    BOTTLES DRAWN AEROBIC AND ANAEROBIC Blood Culture results may not be optimal due to an excessive volume of blood received in culture bottles   Culture  Setup Time   Final    GRAM POSITIVE COCCI IN CLUSTERS IN BOTH AEROBIC AND ANAEROBIC BOTTLES CRITICAL RESULT CALLED TO, READ BACK BY AND VERIFIED WITH: PHARMD E.SINCLAIR AT 1255 ON 03/29/2021 BY T.SAAD.    Culture (A)  Final    STAPHYLOCOCCUS EPIDERMIDIS THE SIGNIFICANCE OF ISOLATING THIS ORGANISM FROM A SINGLE SET OF BLOOD CULTURES WHEN MULTIPLE SETS ARE DRAWN IS UNCERTAIN. PLEASE NOTIFY THE MICROBIOLOGY DEPARTMENT WITHIN ONE WEEK IF SPECIATION AND SENSITIVITIES ARE REQUIRED. Performed at [facility], [address], [place], [place] [address]    Report Status 03/31/2021 FINAL  Final  Blood Culture ID Panel (Reflexed)     Status: Abnormal   Collection Time: 03/28/21  2:50 PM  Result Value Ref Range Status   Enterococcus faecalis NOT DETECTED NOT DETECTED Final   Enterococcus Faecium NOT DETECTED NOT DETECTED Final   Listeria monocytogenes NOT DETECTED NOT DETECTED Final   Staphylococcus species DETECTED (A) NOT DETECTED Final    Comment: CRITICAL RESULT CALLED TO, READ BACK BY AND VERIFIED WITH: PHARMD E.SINCLAIR AT 1255 ON 03/29/2021 BY T.SAAD.    Staphylococcus aureus (BCID) NOT DETECTED NOT DETECTED Final   Staphylococcus epidermidis DETECTED (A) NOT DETECTED Final    Comment: CRITICAL RESULT CALLED TO, READ BACK BY AND VERIFIED WITH: PHARMD E.SINCLAIR AT 1255 ON 03/29/2021 BY T.SAAD.    Staphylococcus lugdunensis NOT DETECTED NOT DETECTED Final   Streptococcus species NOT DETECTED NOT DETECTED Final   Streptococcus agalactiae NOT DETECTED NOT DETECTED Final   Streptococcus pneumoniae NOT DETECTED NOT DETECTED Final   Streptococcus pyogenes NOT DETECTED NOT DETECTED Final   A.calcoaceticus-baumannii NOT DETECTED NOT DETECTED Final   Bacteroides fragilis NOT DETECTED NOT DETECTED Final  Enterobacterales NOT DETECTED NOT DETECTED Final   Enterobacter cloacae complex NOT DETECTED NOT DETECTED Final   Escherichia coli NOT DETECTED NOT DETECTED Final   Klebsiella aerogenes NOT DETECTED NOT DETECTED Final   Klebsiella oxytoca NOT DETECTED NOT DETECTED Final   Klebsiella pneumoniae NOT DETECTED NOT DETECTED Final   Proteus species NOT DETECTED NOT DETECTED Final   Salmonella species NOT DETECTED NOT DETECTED Final   Serratia marcescens NOT DETECTED NOT DETECTED Final   Haemophilus influenzae NOT DETECTED NOT DETECTED Final   Neisseria meningitidis NOT DETECTED NOT DETECTED Final   Pseudomonas aeruginosa NOT DETECTED NOT DETECTED Final   Stenotrophomonas maltophilia NOT DETECTED NOT DETECTED Final    Candida albicans NOT DETECTED NOT DETECTED Final   Candida auris NOT DETECTED NOT DETECTED Final   Candida glabrata NOT DETECTED NOT DETECTED Final   Candida krusei NOT DETECTED NOT DETECTED Final   Candida parapsilosis NOT DETECTED NOT DETECTED Final   Candida tropicalis NOT DETECTED NOT DETECTED Final   Cryptococcus neoformans/gattii NOT DETECTED NOT DETECTED Final   Methicillin resistance mecA/C NOT DETECTED NOT DETECTED Final    Comment: Performed at [facility], [address], [place], [place] [address]  CSF culture w Stat Gram Stain     Status: None (Preliminary result)   Collection Time: 03/29/21  2:53 PM   Specimen: CSF; Cerebrospinal Fluid  Result Value Ref Range Status   Specimen Description CSF  Final   Special Requests NONE  Final   Gram Stain   Final    WBC PRESENT,BOTH PMN AND MONONUCLEAR NO ORGANISMS SEEN CYTOSPIN SMEAR    Culture   Final    NO GROWTH 2 DAYS Performed at [facility], [address] [place], [place] [address]    Report Status PENDING  Incomplete  Resp Panel by RT-PCR (Flu A&B, Covid) Nasopharyngeal Swab     Status: None   Collection Time: 03/29/21  3:12 PM   Specimen: Nasopharyngeal Swab; Nasopharyngeal(NP) swabs in vial transport medium  Result Value Ref Range Status   SARS Coronavirus 2 by RT PCR NEGATIVE NEGATIVE Final    Comment: (NOTE) SARS-CoV-2 target nucleic acids are NOT DETECTED.  The SARS-CoV-2 RNA is generally detectable in upper respiratory specimens during the acute phase of infection. The lowest concentration of SARS-CoV-2 viral copies this assay can detect is 138 copies/mL. A negative result does not preclude SARS-Cov-2 infection and should not be used as the sole basis for treatment or other patient management decisions. A negative result may occur with  improper specimen collection/handling, submission of specimen other than nasopharyngeal swab, presence of viral mutation(s) within the areas targeted by this  assay, and inadequate number of viral copies(<138 copies/mL). A negative result must be combined with clinical observations, patient history, and epidemiological information. The expected result is Negative.  Fact Sheet for Patients:  BloggerCourse.com  Fact Sheet for Healthcare Providers:  SeriousBroker.it  This test is no t yet approved or cleared by the [place] FDA and  has been authorized for detection and/or diagnosis of SARS-CoV-2 by FDA under an Emergency Use Authorization (EUA). This EUA will remain  in effect (meaning this test can be used) for the duration of the COVID-19 declaration under Section 564(b)(1) of the Act, 21 U.S.C.section 360bbb-3(b)(1), unless the authorization is terminated  or revoked sooner.       Influenza A by PCR NEGATIVE NEGATIVE Final   Influenza B by PCR NEGATIVE NEGATIVE Final    Comment: (NOTE) The Xpert  Xpress SARS-CoV-2/FLU/RSV plus assay is intended as an aid in the diagnosis of influenza from Nasopharyngeal swab specimens and should not be used as a sole basis for treatment. Nasal washings and aspirates are unacceptable for Xpert Xpress SARS-CoV-2/FLU/RSV testing.  Fact Sheet for Patients: BloggerCourse.com  Fact Sheet for Healthcare Providers: SeriousBroker.it  This test is not yet approved or cleared by the [place] FDA and has been authorized for detection and/or diagnosis of SARS-CoV-2 by FDA under an Emergency Use Authorization (EUA). This EUA will remain in effect (meaning this test can be used) for the duration of the COVID-19 declaration under Section 564(b)(1) of the Act, 21 U.S.C. section 360bbb-3(b)(1), unless the authorization is terminated or revoked.  Performed at [facility], [address], [place], [place] [address]       Studies: MR THORACIC SPINE WO CONTRAST  Result Date: 03/30/2021 CLINICAL  DATA:  Recent LP, evaluate for epidural collection. EXAM: MRI THORACIC AND LUMBAR SPINE WITHOUT CONTRAST TECHNIQUE: Multiplanar and multiecho pulse sequences of the thoracic and lumbar spine were obtained without intravenous contrast. COMPARISON:  None. FINDINGS: MRI THORACIC SPINE FINDINGS Alignment:  Normal Vertebrae: No fracture, evidence of discitis, or bone lesion. No evidence of bone infarct Cord: Diffuse T2 hyperintensity in the cord, primarily central with mild swollen appearance. There is likely cervical involvement as well, but partially covered. Paraspinal and other soft tissues: No evidence of perispinal mass or inflammation. Disc levels: Small disc protrusions at T7-8 and T8-9 which contact but do not compress the cord. MRI LUMBAR SPINE FINDINGS Segmentation:  5 lumbar type vertebrae Alignment:  Normal Vertebrae:  No fracture, evidence of discitis, or bone lesion. Conus medullaris and cauda equina: Conus extends to the L1 level. Mild T2 hyperintensity and swelling at the level of the conus. No cauda equina thickening, nodularity, or distortion. Paraspinal and other soft tissues: Interspinous T2 hyperintensity at L4-5 which is likely from the lumbar puncture. No collection. Disc levels: Well preserved disc height and hydration. Negative facets. No neural impingement. IMPRESSION: 1. Longitudinally extensive cord signal abnormality/myelitis. 2. No hematoma or other complication related to lumbar puncture. Electronically Signed   By: Tiburcio Pea M.D.   On: 03/30/2021 11:11   MR LUMBAR SPINE WO CONTRAST  Result Date: 03/30/2021 CLINICAL DATA:  Recent LP, evaluate for epidural collection. EXAM: MRI THORACIC AND LUMBAR SPINE WITHOUT CONTRAST TECHNIQUE: Multiplanar and multiecho pulse sequences of the thoracic and lumbar spine were obtained without intravenous contrast. COMPARISON:  None. FINDINGS: MRI THORACIC SPINE FINDINGS Alignment:  Normal Vertebrae: No fracture, evidence of discitis, or bone  lesion. No evidence of bone infarct Cord: Diffuse T2 hyperintensity in the cord, primarily central with mild swollen appearance. There is likely cervical involvement as well, but partially covered. Paraspinal and other soft tissues: No evidence of perispinal mass or inflammation. Disc levels: Small disc protrusions at T7-8 and T8-9 which contact but do not compress the cord. MRI LUMBAR SPINE FINDINGS Segmentation:  5 lumbar type vertebrae Alignment:  Normal Vertebrae:  No fracture, evidence of discitis, or bone lesion. Conus medullaris and cauda equina: Conus extends to the L1 level. Mild T2 hyperintensity and swelling at the level of the conus. No cauda equina thickening, nodularity, or distortion. Paraspinal and other soft tissues: Interspinous T2 hyperintensity at L4-5 which is likely from the lumbar puncture. No collection. Disc levels: Well preserved disc height and hydration. Negative facets. No neural impingement. IMPRESSION: 1. Longitudinally extensive cord signal abnormality/myelitis. 2. No hematoma or other complication related to  lumbar puncture. Electronically Signed   By: Tiburcio Pea M.D.   On: 03/30/2021 11:11   ECHOCARDIOGRAM COMPLETE  Result Date: 03/30/2021    ECHOCARDIOGRAM REPORT   Patient Name:   GAREN WOOLBRIGHT Date of Exam: 03/30/2021 Medical Rec #:  [id number]           Height:       71.0 in Accession #:    [id number]          Weight:       165.0 lb Date of Birth:  09/30/1985            BSA:          1.943 m Patient Age:    35 years            BP:           121/64 mmHg Patient Gender: M                   HR:           79 bpm. Exam Location:  Inpatient Procedure: 2D Echo, Cardiac Doppler and Color Doppler Indications:    Other abnormalities of the heart  History:        Patient has no prior history of Echocardiogram examinations.                 Arrythmias:Bradycardia. Seizure.  Sonographer:    Ross Ludwig RDCS (AE) Referring Phys: [id number] THOMAS A KELLY IMPRESSIONS  1. Left ventricular  ejection fraction, by estimation, is 55 to 60%. The left ventricle has normal function. The left ventricle has no regional wall motion abnormalities. Left ventricular diastolic parameters were normal.  2. Right ventricular systolic function is normal. The right ventricular size is normal. Tricuspid regurgitation signal is inadequate for assessing PA pressure.  3. There is a trivial pericardial effusion posterior to the left ventricle.  4. The mitral valve is grossly normal. Trivial mitral valve regurgitation.  5. The aortic valve is tricuspid. Aortic valve regurgitation is not visualized. No aortic stenosis is present. Aortic valve mean gradient measures 6.0 mmHg.  6. The inferior vena cava is normal in size with <50% respiratory variability, suggesting right atrial pressure of 8 mmHg. Comparison(s): No prior Echocardiogram. FINDINGS  Left Ventricle: Left ventricular ejection fraction, by estimation, is 55 to 60%. The left ventricle has normal function. The left ventricle has no regional wall motion abnormalities. The left ventricular internal cavity size was normal in size. There is  no left ventricular hypertrophy. Left ventricular diastolic parameters were normal. Right Ventricle: The right ventricular size is normal. No increase in right ventricular wall thickness. Right ventricular systolic function is normal. Tricuspid regurgitation signal is inadequate for assessing PA pressure. Left Atrium: Left atrial size was normal in size. Right Atrium: Right atrial size was normal in size. Pericardium: Trivial pericardial effusion is present. The pericardial effusion is posterior to the left ventricle. Mitral Valve: The mitral valve is grossly normal. Trivial mitral valve regurgitation. Tricuspid Valve: The tricuspid valve is grossly normal. Tricuspid valve regurgitation is trivial. Aortic Valve: The aortic valve is tricuspid. Aortic valve regurgitation is not visualized. No aortic stenosis is present. Aortic valve mean  gradient measures 6.0 mmHg. Aortic valve peak gradient measures 14.1 mmHg. Aortic valve area, by VTI measures 3.00  cm. Pulmonic Valve: The pulmonic valve was grossly normal. Pulmonic valve regurgitation is trivial. Aorta: The aortic root is normal in size and structure. Venous: The inferior vena cava is normal in  size with less than 50% respiratory variability, suggesting right atrial pressure of 8 mmHg. IAS/Shunts: No atrial level shunt detected by color flow Doppler.  LEFT VENTRICLE PLAX 2D LVIDd:         4.90 cm  Diastology LVIDs:         3.50 cm  LV e' medial:    17.60 cm/s LV PW:         1.00 cm  LV E/e' medial:  6.3 LV IVS:        0.80 cm  LV e' lateral:   21.90 cm/s LVOT diam:     2.10 cm  LV E/e' lateral: 5.1 LV SV:         94 LV SV Index:   48 LVOT Area:     3.46 cm  RIGHT VENTRICLE             IVC RV Basal diam:  3.20 cm     IVC diam: 2.00 cm RV S prime:     16.20 cm/s TAPSE (M-mode): 4.0 cm LEFT ATRIUM           Index       RIGHT ATRIUM           Index LA diam:      2.50 cm 1.29 cm/m  RA Area:     16.60 cm LA Vol (A2C): 39.9 ml 20.53 ml/m RA Volume:   38.60 ml  19.87 ml/m LA Vol (A4C): 37.2 ml 19.15 ml/m  AORTIC VALVE AV Area (Vmax):    2.86 cm AV Area (Vmean):   3.07 cm AV Area (VTI):     3.00 cm AV Vmax:           188.00 cm/s AV Vmean:          109.000 cm/s AV VTI:            0.312 m AV Peak Grad:      14.1 mmHg AV Mean Grad:      6.0 mmHg LVOT Vmax:         155.00 cm/s LVOT Vmean:        96.600 cm/s LVOT VTI:          0.270 m LVOT/AV VTI ratio: 0.87  AORTA Ao Root diam: 3.30 cm Ao Asc diam:  2.90 cm MITRAL VALVE MV Area (PHT): 2.76 cm     SHUNTS MV Decel Time: 275 msec     Systemic VTI:  0.27 m MV E velocity: 111.00 cm/s  Systemic Diam: 2.10 cm MV A velocity: 43.30 cm/s MV E/A ratio:  2.56 Nona Dell MD Electronically signed by Nona Dell MD Signature Date/Time: 03/30/2021/2:07:45 PM    Final     Scheduled Meds:  Chlorhexidine Gluconate Cloth  6 each Topical Daily   enoxaparin  (LOVENOX) injection  40 mg Subcutaneous Q24H   levETIRAcetam  500 mg Oral BID   tamsulosin  0.4 mg Oral Daily    Continuous Infusions:  sodium chloride 125 mL/hr at 03/31/21 0144   acyclovir 750 mg (03/31/21 0512)   ampicillin (OMNIPEN) IV 2 g (03/31/21 0616)   cefTRIAXone (ROCEPHIN)  IV 2 g (03/31/21 0436)   methylPREDNISolone (SOLU-MEDROL) injection 250 mg (03/30/21 1522)   vancomycin 1,000 mg (03/31/21 0514)     LOS: 2 days     Myrtie Neither, MD [facility]  To reach me or the doctor on call, go to: [email or web address] Password [email or web address]  03/31/2021, 10:02 AM

## 2021-03-31 NOTE — Progress Notes (Addendum)
Neurology Progress Note  S: Patient feels generally fine. Still can not move his legs. Wife is in room and still appears upset with news yesterday. No HA, n/v, abd pain, numbness/tingling, or weakness of arms.   O: Current vital signs: Tmax 102F past 24 hours.  BP 123/65 (BP Location: Left Arm)   Pulse (!) 53   Temp 97.9 F (36.6 C) (Oral)   Resp 16   Ht 5\' 11"  (1.803 m)   Wt 74.8 kg   SpO2 96%   BMI 23.01 kg/m  Vital signs in last 24 hours: Temp:  [97.5 F (36.4 C)-100.8 F (38.2 C)] 97.9 F (36.6 C) (10/02 0742) Pulse Rate:  [53-85] 53 (10/02 0742) Resp:  [14-20] 16 (10/02 0742) BP: (120-141)/(63-91) 123/65 (10/02 0742) SpO2:  [94 %-100 %] 96 % (10/02 0419)  GENERAL: Fairly well appearing male lying in bed. Eyes closed, easily awakened. Awake in NAD. Doesn't want to wake up and keeps closing his eyes and NP has to coach him to open his eyes for parts of exam.  HEENT: Normocephalic and atraumatic. LUNGS: Normal respiratory effort.  Ext: warm. Psych: Affect dull with avoidance.    NEURO:  Mental Status: Oriented. Follows commands reluctantly.  Speech/Language: speech is without aphasia or dysarthria.  Naming, repetition, fluency, and comprehension intact.  Cranial Nerves:  PERRL. EOMI. Sensation to face and extremities intact to light touch. Hearing intact to voice.  Motor:  5/5 in the right upper extremity, he has significant weakness throughout the left upper extremity. BLEs 0/5. Unable to wiggle toes.  Tone: is normal and bulk is normal. Gait- deferred due to paraplegia.  Medications  Current Facility-Administered Medications:    0.9 %  sodium chloride infusion, , Intravenous, Continuous, 08-24-1993, [facility], Last Rate: 125 mL/hr at 03/31/21 0144, New Bag at 03/31/21 0144   acetaminophen (TYLENOL) tablet 650 mg, 650 mg, Oral, Q6H PRN, 650 mg at 03/30/21 0417 **OR** acetaminophen (TYLENOL) suppository 650 mg, 650 mg, Rectal, Q6H PRN, 05/30/21, MD   acyclovir  (ZOVIRAX) 750 mg in dextrose 5 % 150 mL IVPB, 750 mg, Intravenous, Q8H, Orland Mustard, RPH, Last Rate: 165 mL/hr at 03/31/21 0512, 750 mg at 03/31/21 0512   ampicillin (OMNIPEN) 2 g in sodium chloride 0.9 % 100 mL IVPB, 2 g, Intravenous, Q4H, Millen, 05/31/21, RPH, Last Rate: 300 mL/hr at 03/31/21 0616, 2 g at 03/31/21 0616   cefTRIAXone (ROCEPHIN) 2 g in sodium chloride 0.9 % 100 mL IVPB, 2 g, Intravenous, Q12H, 05/31/21, MD, Last Rate: 200 mL/hr at 03/31/21 0436, 2 g at 03/31/21 0436   Chlorhexidine Gluconate Cloth 2 % PADS 6 each, 6 each, Topical, Daily, Kc, Ramesh, MD, 6 each at 03/30/21 1527   enoxaparin (LOVENOX) injection 40 mg, 40 mg, Subcutaneous, Q24H, 05/30/21, MD, 40 mg at 03/30/21 2205   ibuprofen (ADVIL) tablet 600 mg, 600 mg, Oral, Q6H PRN, Chotiner, 2206, MD, 600 mg at 03/29/21 0145   levETIRAcetam (KEPPRA) tablet 500 mg, 500 mg, Oral, BID, 03/31/21, MD, 500 mg at 03/30/21 2205   LORazepam (ATIVAN) injection 1 mg, 1 mg, Intravenous, Q4H PRN, 2206 A, PA-C   methylPREDNISolone sodium succinate (SOLU-MEDROL) 250 mg in sodium chloride 0.9 % 50 mL IVPB, 250 mg, Intravenous, Daily, Army Melia, MD, Last Rate: 108 mL/hr at 03/30/21 1522, 250 mg at 03/30/21 1522   tamsulosin (FLOMAX) capsule 0.4 mg, 0.4 mg, Oral, Daily, Kc, Ramesh, MD, 0.4 mg at 03/30/21 1132  vancomycin (VANCOCIN) IVPB 1000 mg/200 mL premix, 1,000 mg, Intravenous, Q8H, Millen, [name] B, RPH, Last Rate: 200 mL/hr at 03/31/21 0514, 1,000 mg at 03/31/21 0514  Pertinent Labs. WBCC 03/30/21 10.6. Neutrophils normal.   Imaging.  MRI Tspine and Lspine 03/30/21.  -Longitudinally extensive cord signal abnormality/myelitis. -No hematoma or other complication related to lumbar puncture  Assessment: 35 y.o. male with PMHx significant for everyday smoker who presented with multiple focal seizures with retained awareness and 2 focal seizures with secondary generalization. His  LP showed evidence of pleocytosis and CBC with elevated WBCC. Acyclovir was started immediately for possible encephalitis. ID saw and bacterial antibiotics were added after neutrophilic CSF was seen.   Impression:  -paraplegia, secondary to longitudinal myelitis.   -encephalitis, cleared.  -Dull affect, ? Depression.   Recommendations/Plan:  -Trend fever curve. Tmax 119f last 24 hours.  -Continue Solumedrol as ordered, today is Day #2.  -Continue Antiviral and antibiotics.  -Seizure precautions.  -Ativan 2mg  prn seizure lasting over 3 minutes and call neurology.  -PT/OT. He will likely need long rehab or CIR.  -SW/case management consult for family needs. . -May want to consider SSRI.   Pt seen by , MSN, APN-BC/Nurse Practitioner/Neuro and later by MD. Note and plan to be edited as needed by MD.  Pager: Jimmye Norman  I seen the patient reviewed the above note.  He has complete paraplegia bilateral lower extremities.  He has significant weakness bilaterally in his legs.  Possibilities include infectious encephalomyelitis such as West Nile versus less likely some type of inflammatory encephalomyelitis.  Given the progression of his symptoms, after discussion with ID we decided to pursue IV Solu-Medrol to try and reduce inflammation and secondary cerebral injury.  We are currently awaiting bio fire PCR array from [facility], as well as cultures. If these turn out to be negative, then I would broaden to include autoimmune/inflammatory causes.   [phone], MD [facility] [phone]  If 7pm- 7am, please page neurology on call as listed in AMION.

## 2021-03-31 NOTE — Progress Notes (Signed)
   03/31/21 1658  Clinical Encounter Type  Visited With Patient;Family  Visit Type Initial;Social support  Referral From Patient  Spiritual Encounters  Spiritual Needs Emotional  Stress Factors  Patient Stress Factors Family relationships;Health changes   Chaplain responded to a request from the patient. Patient is undergoing some difficult family situations and wanted to talk through that. Chaplain offered reflective listening, facilitated a bit of a life review, and offered some encouragements. Chaplain introduced spiritual care services. Spiritual care services available as needed.   Alda Ponder, Chaplain

## 2021-03-31 NOTE — Progress Notes (Signed)
   Progress Note  Patient Name: [name] Date of Encounter: 03/31/2021  Primary Cardiologist: Nicki Guadalajara, MD  Telemetry shows no significant bradycardia beyond the high 40s as before, no pauses or heart block.  Blood pressure normal range.  Pertinent lab work includes potassium 3.8, BUN 7, creatinine 1.1, hemoglobin 11.8, platelets 215.  Follow-up echocardiogram reveals LVEF 55 to 60% without regional wall motion abnormalities, trivial posterior pericardial effusion, no significant valvular abnormalities.  Will follow at distance for now.  Signed, Nona Dell, MD  03/31/2021, 9:39 AM

## 2021-03-31 NOTE — Progress Notes (Signed)
[facility]   Reason for visit: Follow up on encephalitis, myelitis  Interval History: WBC 10.7, no associated rash or diarrhea.  Afebrile overnight.  Wife at bedside.    Physical Exam: Constitutional:  Vitals:   03/31/21 0742 03/31/21 1134  BP: 123/65 (!) 142/76  Pulse: (!) 53 66  Resp: 16 14  Temp: 97.9 F (36.6 C) 98 F (36.7 C)  SpO2:  (!) 84%   patient appears in NAD Respiratory: Normal respiratory effort; CTA B Cardiovascular: RRR Neuro: more alert, able to speak in full sentences and oriented.  Not able to move lower extremities and limited upper extremity movement, particularly fine motor/hands  Review of Systems: Constitutional: negative for fevers and chills Gastrointestinal: negative for nausea and diarrhea  Lab Results  Component Value Date   WBC 10.7 (H) 03/31/2021   HGB 13.2 03/31/2021   HCT 39.0 03/31/2021   MCV 91.3 03/31/2021   PLT 259 03/31/2021    Lab Results  Component Value Date   CREATININE 0.74 03/31/2021   BUN 8 03/31/2021   NA 139 03/31/2021   K 4.2 03/31/2021   CL 107 03/31/2021   CO2 22 03/31/2021    Lab Results  Component Value Date   ALT 25 03/28/2021   AST 36 03/28/2021   ALKPHOS 39 03/28/2021     Microbiology: Recent Results (from the past 240 hour(s))  Culture, blood (Routine X 2) w Reflex to ID Panel     Status: None   Collection Time: 03/25/21  5:04 PM   Specimen: BLOOD  Result Value Ref Range Status   Specimen Description   Final    BLOOD RIGHT ANTECUBITAL Performed at [facility], [address], [place], [place] [address]    Special Requests   Final    BOTTLES DRAWN AEROBIC AND ANAEROBIC Blood Culture adequate volume Performed at [facility], [address] [place], [place] [address]    Culture   Final    NO GROWTH 5 DAYS Performed at [facility], [address] [place], [place] [address]    Report Status 03/30/2021 FINAL  Final  Culture, blood  (Routine X 2) w Reflex to ID Panel     Status: None   Collection Time: 03/25/21  5:12 PM   Specimen: BLOOD  Result Value Ref Range Status   Specimen Description   Final    BLOOD LEFT ANTECUBITAL Performed at [facility], [address], [place], [place] [address]    Special Requests   Final    BOTTLES DRAWN AEROBIC AND ANAEROBIC Blood Culture adequate volume Performed at [facility], [address] [place], [place] [address]    Culture   Final    NO GROWTH 5 DAYS Performed at [facility], [address], [place], [place] [address]    Report Status 03/30/2021 FINAL  Final  Urine Culture     Status: None   Collection Time: 03/28/21  2:44 PM   Specimen: In/Out Cath Urine  Result Value Ref Range Status   Specimen Description IN/OUT CATH URINE  Final   Special Requests NONE  Final   Culture   Final    NO GROWTH Performed at [facility], [address] [place], [place] [address]    Report Status 03/29/2021 FINAL  Final  Blood Culture (routine x 2)     Status: None (Preliminary result)   Collection Time:  03/28/21  2:45 PM   Specimen: BLOOD  Result Value Ref Range Status   Specimen Description BLOOD RIGHT UPPER ARM  Final   Special Requests   Final    BOTTLES DRAWN AEROBIC AND ANAEROBIC Blood Culture results may not be optimal due to an excessive volume of blood received in culture bottles   Culture   Final    NO GROWTH 3 DAYS Performed at [facility], [address] [place], [place] [address]    Report Status PENDING  Incomplete  Blood Culture (routine x 2)     Status: Abnormal   Collection Time: 03/28/21  2:50 PM   Specimen: BLOOD  Result Value Ref Range Status   Specimen Description BLOOD LEFT ANTECUBITAL  Final   Special Requests   Final    BOTTLES DRAWN AEROBIC AND ANAEROBIC Blood Culture results may not be optimal due to an excessive volume of blood received in culture bottles   Culture  Setup Time   Final    GRAM POSITIVE COCCI  IN CLUSTERS IN BOTH AEROBIC AND ANAEROBIC BOTTLES CRITICAL RESULT CALLED TO, READ BACK BY AND VERIFIED WITH: PHARMD E.SINCLAIR AT 1255 ON 03/29/2021 BY T.SAAD.    Culture (A)  Final    STAPHYLOCOCCUS EPIDERMIDIS THE SIGNIFICANCE OF ISOLATING THIS ORGANISM FROM A SINGLE SET OF BLOOD CULTURES WHEN MULTIPLE SETS ARE DRAWN IS UNCERTAIN. PLEASE NOTIFY THE MICROBIOLOGY DEPARTMENT WITHIN ONE WEEK IF SPECIATION AND SENSITIVITIES ARE REQUIRED. Performed at [facility], [address] [place], [place] [address]    Report Status 03/31/2021 FINAL  Final  Blood Culture ID Panel (Reflexed)     Status: Abnormal   Collection Time: 03/28/21  2:50 PM  Result Value Ref Range Status   Enterococcus faecalis NOT DETECTED NOT DETECTED Final   Enterococcus Faecium NOT DETECTED NOT DETECTED Final   Listeria monocytogenes NOT DETECTED NOT DETECTED Final   Staphylococcus species DETECTED (A) NOT DETECTED Final    Comment: CRITICAL RESULT CALLED TO, READ BACK BY AND VERIFIED WITH: PHARMD E.SINCLAIR AT 1255 ON 03/29/2021 BY T.SAAD.    Staphylococcus aureus (BCID) NOT DETECTED NOT DETECTED Final   Staphylococcus epidermidis DETECTED (A) NOT DETECTED Final    Comment: CRITICAL RESULT CALLED TO, READ BACK BY AND VERIFIED WITH: PHARMD E.SINCLAIR AT 1255 ON 03/29/2021 BY T.SAAD.    Staphylococcus lugdunensis NOT DETECTED NOT DETECTED Final   Streptococcus species NOT DETECTED NOT DETECTED Final   Streptococcus agalactiae NOT DETECTED NOT DETECTED Final   Streptococcus pneumoniae NOT DETECTED NOT DETECTED Final   Streptococcus pyogenes NOT DETECTED NOT DETECTED Final   A.calcoaceticus-baumannii NOT DETECTED NOT DETECTED Final   Bacteroides fragilis NOT DETECTED NOT DETECTED Final   Enterobacterales NOT DETECTED NOT DETECTED Final   Enterobacter cloacae complex NOT DETECTED NOT DETECTED Final   Escherichia coli NOT DETECTED NOT DETECTED Final   Klebsiella aerogenes NOT DETECTED NOT DETECTED Final    Klebsiella oxytoca NOT DETECTED NOT DETECTED Final   Klebsiella pneumoniae NOT DETECTED NOT DETECTED Final   Proteus species NOT DETECTED NOT DETECTED Final   Salmonella species NOT DETECTED NOT DETECTED Final   Serratia marcescens NOT DETECTED NOT DETECTED Final   Haemophilus influenzae NOT DETECTED NOT DETECTED Final   Neisseria meningitidis NOT DETECTED NOT DETECTED Final   Pseudomonas aeruginosa NOT DETECTED NOT DETECTED Final   Stenotrophomonas maltophilia NOT DETECTED NOT DETECTED Final   Candida albicans NOT DETECTED NOT DETECTED Final   Candida auris NOT DETECTED NOT DETECTED Final  Candida glabrata NOT DETECTED NOT DETECTED Final   Candida krusei NOT DETECTED NOT DETECTED Final   Candida parapsilosis NOT DETECTED NOT DETECTED Final   Candida tropicalis NOT DETECTED NOT DETECTED Final   Cryptococcus neoformans/gattii NOT DETECTED NOT DETECTED Final   Methicillin resistance mecA/C NOT DETECTED NOT DETECTED Final    Comment: Performed at [facility], [address], [place], [place] [address]  CSF culture w Stat Gram Stain     Status: None (Preliminary result)   Collection Time: 03/29/21  2:53 PM   Specimen: CSF; Cerebrospinal Fluid  Result Value Ref Range Status   Specimen Description CSF  Final   Special Requests NONE  Final   Gram Stain   Final    WBC PRESENT,BOTH PMN AND MONONUCLEAR NO ORGANISMS SEEN CYTOSPIN SMEAR    Culture   Final    NO GROWTH 2 DAYS Performed at [facility], [address] [place], [place] [address]    Report Status PENDING  Incomplete  Resp Panel by RT-PCR (Flu A&B, Covid) Nasopharyngeal Swab     Status: None   Collection Time: 03/29/21  3:12 PM   Specimen: Nasopharyngeal Swab; Nasopharyngeal(NP) swabs in vial transport medium  Result Value Ref Range Status   SARS Coronavirus 2 by RT PCR NEGATIVE NEGATIVE Final    Comment: (NOTE) SARS-CoV-2 target nucleic acids are NOT DETECTED.  The SARS-CoV-2 RNA is generally detectable in  upper respiratory specimens during the acute phase of infection. The lowest concentration of SARS-CoV-2 viral copies this assay can detect is 138 copies/mL. A negative result does not preclude SARS-Cov-2 infection and should not be used as the sole basis for treatment or other patient management decisions. A negative result may occur with  improper specimen collection/handling, submission of specimen other than nasopharyngeal swab, presence of viral mutation(s) within the areas targeted by this assay, and inadequate number of viral copies(<138 copies/mL). A negative result must be combined with clinical observations, patient history, and epidemiological information. The expected result is Negative.  Fact Sheet for Patients:  BloggerCourse.com  Fact Sheet for Healthcare Providers:  SeriousBroker.it  This test is no t yet approved or cleared by the [place] FDA and  has been authorized for detection and/or diagnosis of SARS-CoV-2 by FDA under an Emergency Use Authorization (EUA). This EUA will remain  in effect (meaning this test can be used) for the duration of the COVID-19 declaration under Section 564(b)(1) of the Act, 21 U.S.C.section 360bbb-3(b)(1), unless the authorization is terminated  or revoked sooner.       Influenza A by PCR NEGATIVE NEGATIVE Final   Influenza B by PCR NEGATIVE NEGATIVE Final    Comment: (NOTE) The Xpert Xpress SARS-CoV-2/FLU/RSV plus assay is intended as an aid in the diagnosis of influenza from Nasopharyngeal swab specimens and should not be used as a sole basis for treatment. Nasal washings and aspirates are unacceptable for Xpert Xpress SARS-CoV-2/FLU/RSV testing.  Fact Sheet for Patients: BloggerCourse.com  Fact Sheet for Healthcare Providers: SeriousBroker.it  This test is not yet approved or cleared by the [place] FDA and has been  authorized for detection and/or diagnosis of SARS-CoV-2 by FDA under an Emergency Use Authorization (EUA). This EUA will remain in effect (meaning this test can be used) for the duration of the COVID-19 declaration under Section 564(b)(1) of the Act, 21 U.S.C. section 360bbb-3(b)(1), unless the authorization is terminated or revoked.  Performed at [facility], [address], [place], [place] [address]  Impression/Plan:  1. Encephalitis/myelitis - some improvement with his mentation, he is able to speak well now and alert.  Unclear etiology but no signs of a bacterial infection to date with a negative CSF culture.  Not c/w Strep pneumonia or listeria so will stop vancomycin and ampicillin.   Will continue with acyclovir Will continue with ceftriaxone for now. Continue steroids  2.  Paraplegia - from #1 above and hopefully will have some benefit from steroids.   Likely will need rehab at some point. Work up otherwise pending.   3.  Fever - from #1 and fever curve trending down.

## 2021-04-01 ENCOUNTER — Inpatient Hospital Stay (HOSPITAL_COMMUNITY): Payer: 59

## 2021-04-01 DIAGNOSIS — R569 Unspecified convulsions: Secondary | ICD-10-CM | POA: Diagnosis not present

## 2021-04-01 LAB — PATHOLOGIST SMEAR REVIEW

## 2021-04-01 LAB — CBC WITH DIFFERENTIAL/PLATELET
Abs Immature Granulocytes: 0.08 10*3/uL — ABNORMAL HIGH (ref 0.00–0.07)
Basophils Absolute: 0 10*3/uL (ref 0.0–0.1)
Basophils Relative: 0 %
Eosinophils Absolute: 0 10*3/uL (ref 0.0–0.5)
Eosinophils Relative: 0 %
HCT: 35 % — ABNORMAL LOW (ref 39.0–52.0)
Hemoglobin: 11.9 g/dL — ABNORMAL LOW (ref 13.0–17.0)
Immature Granulocytes: 1 %
Lymphocytes Relative: 12 %
Lymphs Abs: 1.5 10*3/uL (ref 0.7–4.0)
MCH: 30.7 pg (ref 26.0–34.0)
MCHC: 34 g/dL (ref 30.0–36.0)
MCV: 90.2 fL (ref 80.0–100.0)
Monocytes Absolute: 0.8 10*3/uL (ref 0.1–1.0)
Monocytes Relative: 7 %
Neutro Abs: 9.9 10*3/uL — ABNORMAL HIGH (ref 1.7–7.7)
Neutrophils Relative %: 80 %
Platelets: 271 10*3/uL (ref 150–400)
RBC: 3.88 MIL/uL — ABNORMAL LOW (ref 4.22–5.81)
RDW: 13.2 % (ref 11.5–15.5)
WBC: 12.2 10*3/uL — ABNORMAL HIGH (ref 4.0–10.5)
nRBC: 0 % (ref 0.0–0.2)

## 2021-04-01 LAB — TROPONIN I (HIGH SENSITIVITY)
Troponin I (High Sensitivity): <2 ng/L (ref <18)
Troponin I (High Sensitivity): <2 ng/L (ref <18)

## 2021-04-01 LAB — BASIC METABOLIC PANEL
Anion gap: 8 (ref 5–15)
BUN: 10 mg/dL (ref 6–20)
CO2: 24 mmol/L (ref 22–32)
Calcium: 8.5 mg/dL — ABNORMAL LOW (ref 8.9–10.3)
Chloride: 106 mmol/L (ref 98–111)
Creatinine, Ser: 0.77 mg/dL (ref 0.61–1.24)
GFR, Estimated: >60 mL/min (ref >60)
Glucose, Bld: 136 mg/dL — ABNORMAL HIGH (ref 70–99)
Potassium: 4.1 mmol/L (ref 3.5–5.1)
Sodium: 138 mmol/L (ref 135–145)

## 2021-04-01 LAB — HIV-1 RNA QUANT-NO REFLEX-BLD
HIV 1 RNA Quant: <20 copies/mL
LOG10 HIV-1 RNA: UNDETERMINED log10copy/mL

## 2021-04-01 LAB — ANGIOTENSIN CONVERTING ENZYME: Angiotensin-Converting Enzyme: 30 U/L (ref 14–82)

## 2021-04-01 LAB — MISC LABCORP TEST (SEND OUT)

## 2021-04-01 LAB — NEUROMYELITIS OPTICA AUTOAB, IGG: NMO-IgG: <1.5 U/mL (ref 0.0–3.0)

## 2021-04-01 MED ORDER — SODIUM CHLORIDE 0.9 % IV SOLN
2.0000 g | INTRAVENOUS | Status: DC
  Administered 2021-04-02: 2 g via INTRAVENOUS
  Filled 2021-04-01: qty 20

## 2021-04-01 NOTE — Progress Notes (Signed)
EKG obtained. Copy in chart. MD notified.   Melony Overly, RN

## 2021-04-01 NOTE — Progress Notes (Signed)
Pt c/o left side cp. No radiation to extremities, jaw, or back. Denies nausea. Vitals stable. 2 L o2 started. MD paged. Awaiting orders.  Melony Overly, RN

## 2021-04-01 NOTE — Progress Notes (Signed)
Neurology Progress Note  Brief HPI: 35 y.o. male with a PMHx of genitourinary lesions, groin erythema, and tobacco use who presented with multiple seizures described initially as left hand pins-and-needles sensation, left hand shaking, left eye twitching, progressing into GTC seizure lasting 2-5 minutes s/p Versed.  Patient was febrile at 103.1 F on 9/30 and was started on empiric acyclovir pending HSV PCR.  Major events / Interval History:  9/30: ID consulted, LP complete (WBC 104, RBC 11, Protein 109, Glu 55, clear), EEG with cortical dysfunction of left hemisphere with mild diffuse encephalopathy but without seizures or epileptiform discharges. Arboviral and meningitis/encephalitis panels sent. HIV and RPR negative. Blood cultures + for Methicillin sensitive Staphylococcus epidermidis on ceftriaxone. Acyclovir started empirically for HSV, pending HSV PCR. 10/1: Progressive lower extremity weakness.  Spinal imaging significant for extensive myelitis.  CSF pleocytosis and elevated neutrophil count. Solu-Medrol therapy 250 mg initiated to minimize secondary injury from inflammation. 10/2: Complete paraplegia of BLE 10/3: Afebrile for 24 hours, WBC 12.2 (likely 2/2 steroid initiation)  Subjective: No acute overnight events Patient reports some improvement in left hand weakness today, no improvement in bilateral lower extremity weakness No new complaints, mental status continues to improve  Denies prior history of autoimmune conditions or focal neurological deficits including no prior uveitis or opthalmological complaints   Exam: Vitals:   04/01/21 0408 04/01/21 1000  BP: (!) 98/53 (!) 104/58  Pulse: (!) 54 (!) 54  Resp: 18 17  Temp: 97.9 F (36.6 C) 97.9 F (36.6 C)  SpO2: 98% 100%   Gen: Laying comfortably in bed, initially sleeping on assessment, in no acute distress Resp: non-labored breathing, no respiratory distress on room air Abd: soft, non-tender, non-distended HEENT: No oral  ulcers   Neuro: Mental Status: Asleep initially, wakes easily to voice.   Patient is alert and oriented x4, follows commands. He is able to give a clear and coherent history of present illness. Speech is intact without dysarthria. There are no signs of aphasia or neglect. Cranial Nerves: PERRL, EOMI with saccadic pursuits, mild left eye ptosis noted, facial sensation is intact and symmetric to light touch, face is symmetric resting and with movement, hearing is intact to voice, phonation intact, palate elevates symmetrically, tongue protrudes midline. Motor: BUE are without movement, proprioception is not intact.  Right upper extremity 5/5 strength with reports of improvement in left upper extremity weakness today.  LUE 4+/5 deltoid, 5/5 biceps, triceps, and 4+ wrist extension, 4- wrist flexion, 2/5 finger extension, 4/5 finger flexion.  Sensory: Reports of decreased sensation to touch with tingling with left lower extremity sensation worse than right lower extremity sensation. Sensation to cool temperature is absent bilaterally. Decreased sensation reported from toes to mid abdomen.  DTR: Unable to elicit reflexes in bilateral patellae or achilles. 3+ and symmetric biceps and brachioradialis.  Gait: Deferred for patient safety due to paraplegia.   Pertinent Labs: CBC    Component Value Date/Time   WBC 12.2 (H) 04/01/2021 0330   RBC 3.88 (L) 04/01/2021 0330   HGB 11.9 (L) 04/01/2021 0330   HCT 35.0 (L) 04/01/2021 0330   PLT 271 04/01/2021 0330   MCV 90.2 04/01/2021 0330   MCH 30.7 04/01/2021 0330   MCHC 34.0 04/01/2021 0330   RDW 13.2 04/01/2021 0330   LYMPHSABS 1.5 04/01/2021 0330   MONOABS 0.8 04/01/2021 0330   EOSABS 0.0 04/01/2021 0330   BASOSABS 0.0 04/01/2021 0330   CMP     Component Value Date/Time   NA 138 04/01/2021  0330   K 4.1 04/01/2021 0330   CL 106 04/01/2021 0330   CO2 24 04/01/2021 0330   GLUCOSE 136 (H) 04/01/2021 0330   BUN 10 04/01/2021 0330   CREATININE  0.77 04/01/2021 0330   CALCIUM 8.5 (L) 04/01/2021 0330   PROT 6.9 03/28/2021 1444   ALBUMIN 3.6 03/28/2021 1444   AST 36 03/28/2021 1444   ALT 25 03/28/2021 1444   ALKPHOS 39 03/28/2021 1444   BILITOT 0.4 03/28/2021 1444   GFRNONAA >60 04/01/2021 0330   No results found for: CHOL, HDL, LDLCALC, LDLDIRECT, TRIG, CHOLHDL   Ref. Range 03/29/2021 18:50  Crypto Ag Latest Ref Range: NEGATIVE  NEGATIVE  Cryptococcal Ag Titer Latest Ref Range: NOT INDICATED  NOT INDICATED   Gram Stain WBC PRESENT,BOTH PMN AND MONONUCLEAR  NO ORGANISMS SEEN  CYTOSPIN SMEAR   Culture NO GROWTH 3 DAYS    Tube #  3   Color, CSF COLORLESS COLORLESS   Appearance, CSF CLEAR CLEAR Abnormal    Supernatant  NOT INDICATED   RBC Count, CSF 0 /cu mm 11 High    WBC, CSF 0 - 5 /cu mm 104 High Panic    Comment: CRITICAL RESULT CALLED TO, READ BACK BY AND VERIFIED WITH:   Nathaniel Man, RN, [phone], 03/29/21, E. ADEDOKUN.   Segmented Neutrophils-CSF 0 - 6 % 21 High    Lymphs, CSF 40 - 80 % 64   Monocyte-Macrophage-Spinal Fluid 15 - 45 % 13 Low    Eosinophils, CSF 0 - 1 % 2 High     Lab Results  Component Value Date   CRP 0.8 03/29/2021  Erythrocyte Sedimentation Rate     Component Value Date/Time   ESRSEDRATE 9 03/29/2021 1100   Hepatitis B Surface Ag NON REACTIVE NON REACTIVE   HCV Ab NON REACTIVE NON REACTIVE   Comment: (NOTE)  Nonreactive HCV antibody screen is consistent with no HCV infections,  unless recent infection is suspected or other evidence exists to  indicate HCV infection.    Hep A IgM NON REACTIVE NON REACTIVE   Hep B C IgM NON REACTIVE NON REACTIVE    RPR Ser Ql NON REACTIVE NON REACTIVE    HIV Screen 4th Generation wRfx Non Reactive Non Reactive    Unresulted Labs (From admission, onward)     Start     Ordered   03/30/21 1502  Angiotensin converting enzyme  Once,   R        03/30/21 1502   03/30/21 1502  HIV-1 RNA quant-no reflex-bld  Once,   R        03/30/21 1502   03/30/21 1502   Neuromyelitis optica autoab, IgG  Once,   R        03/30/21 1502   03/30/21 1447  VDRL, CSF  Once,   AD        03/30/21 1447   03/29/21 1452  VZV PCR, CSF  Once,   R        03/29/21 1451   03/29/21 1450  Miscellaneous [facility] test (send-out)  Once,   R       Question:  Test name / description:  Answer:  Meningitis/Encephalitis Panel by PCR to [facility]   03/29/21 1450   03/29/21 1449  HSV 1/2 PCR, CSF Cerebrospinal Fluid  Once,   R        03/29/21 1448   03/29/21 1449  Fungus Culture With Stain  Once,   R  Comments: Run on CSF and not on Blood  Question:  Release to patient  Answer:  Immediate   03/29/21 1448   03/29/21 1449  Arbovirus panel [facility], etc)-perf at [facility] state lab  Once,   R        03/29/21 1449   03/29/21 1449  Pathologist smear review  Once,   R        03/29/21 1449   03/28/21 1447  Urine rapid drug screen (hosp performed)  ONCE - STAT,   STAT        03/28/21 1446           Prior Imaging Reviewed:  MRI brain with contrast 03/29/2021: Faint enhancement is associated with the T2 hyperintense lesions, an inflammatory/demyelinating process is again favored. Given pontine involvement and chart history of genitourinary lesions, specifically consider Bechet's. Further discussion above.  MRI brain wo contrast 03/28/2021: Multifocal abnormal hyperintense T2-weighted signal within both hemispheres, but greatest at the right pre and postcentral gyri. The appearance is concerning for acute demyelination. Lymphoma is a secondary possibility. Postcontrast imaging is recommended as an adjunct to this study, for further evaluation.  MRI thoracic and lumbar spine wo contrast 03/30/2021: 1. Longitudinally extensive cord signal abnormality/myelitis. 2. No hematoma or other complication related to lumbar puncture.  Assessment: 35 y.o. male with PMHx significant tobacco use who presented with multiple focal seizures with retained awareness and 2 focal seizures with secondary  generalization. His LP showed evidence of pleocytosis and CBC with elevated WBCC. Acyclovir was started empirically for possible encephalitis. ID saw and bacterial antibiotics were added after neutrophilic CSF was seen in addition to blood culture results + for Methicillin sensitive Staphylococcus epidermidis. Differential diagnoses include infectious encephalomyelitis such as West Nile versus less likely inflammatory encephalomyelitis. Pending Bio[employer] PCR from [facility] as well as cultures.   Impression:  -paraplegia, secondary to longitudinal myelitis.   -encephalitis, cleared.   Recommendations: - Continue to monitor fever curve, no fever in 24 hours - Leukocytosis, likely reactive to steroids, trend - Continue Solu-Medrol therapy, day 3/3 to be completed today - Follow up on pending labs, as above - Continue acyclovir, HSV PCR pending - Appreciate ID following, ceftriaxone per their recommendations - Continue seizure precautions.  - Ativan 2mg  prn seizure lasting over 3 minutes and call neurology.  - PT/OT. He will likely need long rehab or CIR.  - Neurology will continue to follow.   , AGACNP-BC [facility] [phone]  Attending Neurologist's note:  I personally saw this patient, gathering history, performing a neurologic examination, reviewing relevant labs, personally reviewing relevant imaging including MRI brain and T- and L-spine, and formulated the assessment and plan, adding the note above for completeness and clarity to accurately reflect my thoughts.  Greater than 35 minutes were spent in direct care of this patient today, of which over half was at bedside obtaining additional history, performing examination, and answering the patient's and wife's questions as detailed above, independent of time spent by the NP.   [phone] MD-PhD [facility] [phone]

## 2021-04-01 NOTE — Progress Notes (Signed)
[facility]   Reason for visit: follow up on encephalitis  Interval History: WBC 12.2.  afebrile > 48 hours.  Reports still no movement of his lower extremities.   Day 4 total antibiotics   Physical Exam: Constitutional:  Vitals:   04/01/21 0408 04/01/21 1000  BP: (!) 98/53 (!) 104/58  Pulse: (!) 54 (!) 54  Resp: 18 17  Temp: 97.9 F (36.6 C) 97.9 F (36.6 C)  SpO2: 98% 100%  He is in nad Respiratory: normal respiratory effort Cardiovascular: RRR Neuro: some improved movement of his hands.  No movement of his lower extremities.    Review of Systems: Constitutional: negative for fever or chills Gastrointestinal: negative for diarrhea  Lab Results  Component Value Date   WBC 12.2 (H) 04/01/2021   HGB 11.9 (L) 04/01/2021   HCT 35.0 (L) 04/01/2021   MCV 90.2 04/01/2021   PLT 271 04/01/2021    Lab Results  Component Value Date   CREATININE 0.77 04/01/2021   BUN 10 04/01/2021   NA 138 04/01/2021   K 4.1 04/01/2021   CL 106 04/01/2021   CO2 24 04/01/2021    Lab Results  Component Value Date   ALT 25 03/28/2021   AST 36 03/28/2021   ALKPHOS 39 03/28/2021     Microbiology: Recent Results (from the past 240 hour(s))  Culture, blood (Routine X 2) w Reflex to ID Panel     Status: None   Collection Time: 03/25/21  5:04 PM   Specimen: BLOOD  Result Value Ref Range Status   Specimen Description   Final    BLOOD RIGHT ANTECUBITAL Performed at [facility], [address], [place], [place] [address]    Special Requests   Final    BOTTLES DRAWN AEROBIC AND ANAEROBIC Blood Culture adequate volume Performed at [facility], [address] [place], [place] [address]    Culture   Final    NO GROWTH 5 DAYS Performed at [facility], [address], [place], [place] [address]    Report Status 03/30/2021 FINAL  Final  Culture, blood (Routine X 2) w Reflex to ID Panel     Status: None   Collection Time: 03/25/21   5:12 PM   Specimen: BLOOD  Result Value Ref Range Status   Specimen Description   Final    BLOOD LEFT ANTECUBITAL Performed at [facility], [address] [place] [address], [place], [place] [address]    Special Requests   Final    BOTTLES DRAWN AEROBIC AND ANAEROBIC Blood Culture adequate volume Performed at [facility], [address] [place], [place] [address]    Culture   Final    NO GROWTH 5 DAYS Performed at [facility], [address] [place], [place] [address]    Report Status 03/30/2021 FINAL  Final  Urine Culture     Status: None   Collection Time: 03/28/21  2:44 PM   Specimen: In/Out Cath Urine  Result Value Ref Range Status   Specimen Description IN/OUT CATH URINE  Final   Special Requests NONE  Final   Culture   Final    NO GROWTH Performed at [facility], [address], [place], [place] [address]    Report Status 03/29/2021 FINAL  Final  Blood Culture (routine x 2)     Status: None (Preliminary result)   Collection Time: 03/28/21  2:45 PM  Specimen: BLOOD  Result Value Ref Range Status   Specimen Description BLOOD RIGHT UPPER ARM  Final   Special Requests   Final    BOTTLES DRAWN AEROBIC AND ANAEROBIC Blood Culture results may not be optimal due to an excessive volume of blood received in culture bottles   Culture   Final    NO GROWTH 4 DAYS Performed at [facility], [address], [place], [place] [address]    Report Status PENDING  Incomplete  Blood Culture (routine x 2)     Status: Abnormal   Collection Time: 03/28/21  2:50 PM   Specimen: BLOOD  Result Value Ref Range Status   Specimen Description BLOOD LEFT ANTECUBITAL  Final   Special Requests   Final    BOTTLES DRAWN AEROBIC AND ANAEROBIC Blood Culture results may not be optimal due to an excessive volume of blood received in culture bottles   Culture  Setup Time   Final    GRAM POSITIVE COCCI IN CLUSTERS IN BOTH AEROBIC AND ANAEROBIC BOTTLES CRITICAL RESULT CALLED TO,  READ BACK BY AND VERIFIED WITH: PHARMD E.SINCLAIR AT 1255 ON 03/29/2021 BY T.SAAD.    Culture (A)  Final    STAPHYLOCOCCUS EPIDERMIDIS THE SIGNIFICANCE OF ISOLATING THIS ORGANISM FROM A SINGLE SET OF BLOOD CULTURES WHEN MULTIPLE SETS ARE DRAWN IS UNCERTAIN. PLEASE NOTIFY THE MICROBIOLOGY DEPARTMENT WITHIN ONE WEEK IF SPECIATION AND SENSITIVITIES ARE REQUIRED. Performed at [facility], [address] [place], [place] [address]    Report Status 03/31/2021 FINAL  Final  Blood Culture ID Panel (Reflexed)     Status: Abnormal   Collection Time: 03/28/21  2:50 PM  Result Value Ref Range Status   Enterococcus faecalis NOT DETECTED NOT DETECTED Final   Enterococcus Faecium NOT DETECTED NOT DETECTED Final   Listeria monocytogenes NOT DETECTED NOT DETECTED Final   Staphylococcus species DETECTED (A) NOT DETECTED Final    Comment: CRITICAL RESULT CALLED TO, READ BACK BY AND VERIFIED WITH: PHARMD E.SINCLAIR AT 1255 ON 03/29/2021 BY T.SAAD.    Staphylococcus aureus (BCID) NOT DETECTED NOT DETECTED Final   Staphylococcus epidermidis DETECTED (A) NOT DETECTED Final    Comment: CRITICAL RESULT CALLED TO, READ BACK BY AND VERIFIED WITH: PHARMD E.SINCLAIR AT 1255 ON 03/29/2021 BY T.SAAD.    Staphylococcus lugdunensis NOT DETECTED NOT DETECTED Final   Streptococcus species NOT DETECTED NOT DETECTED Final   Streptococcus agalactiae NOT DETECTED NOT DETECTED Final   Streptococcus pneumoniae NOT DETECTED NOT DETECTED Final   Streptococcus pyogenes NOT DETECTED NOT DETECTED Final   A.calcoaceticus-baumannii NOT DETECTED NOT DETECTED Final   Bacteroides fragilis NOT DETECTED NOT DETECTED Final   Enterobacterales NOT DETECTED NOT DETECTED Final   Enterobacter cloacae complex NOT DETECTED NOT DETECTED Final   Escherichia coli NOT DETECTED NOT DETECTED Final   Klebsiella aerogenes NOT DETECTED NOT DETECTED Final   Klebsiella oxytoca NOT DETECTED NOT DETECTED Final   Klebsiella pneumoniae NOT DETECTED  NOT DETECTED Final   Proteus species NOT DETECTED NOT DETECTED Final   Salmonella species NOT DETECTED NOT DETECTED Final   Serratia marcescens NOT DETECTED NOT DETECTED Final   Haemophilus influenzae NOT DETECTED NOT DETECTED Final   Neisseria meningitidis NOT DETECTED NOT DETECTED Final   Pseudomonas aeruginosa NOT DETECTED NOT DETECTED Final   Stenotrophomonas maltophilia NOT DETECTED NOT DETECTED Final   Candida albicans NOT DETECTED NOT DETECTED Final   Candida auris NOT DETECTED NOT DETECTED Final   Candida glabrata NOT DETECTED NOT DETECTED  Final   Candida krusei NOT DETECTED NOT DETECTED Final   Candida parapsilosis NOT DETECTED NOT DETECTED Final   Candida tropicalis NOT DETECTED NOT DETECTED Final   Cryptococcus neoformans/gattii NOT DETECTED NOT DETECTED Final   Methicillin resistance mecA/C NOT DETECTED NOT DETECTED Final    Comment: Performed at [facility], [address], [place], [place] [address]  CSF culture w Stat Gram Stain     Status: None (Preliminary result)   Collection Time: 03/29/21  2:53 PM   Specimen: CSF; Cerebrospinal Fluid  Result Value Ref Range Status   Specimen Description CSF  Final   Special Requests NONE  Final   Gram Stain   Final    WBC PRESENT,BOTH PMN AND MONONUCLEAR NO ORGANISMS SEEN CYTOSPIN SMEAR    Culture   Final    NO GROWTH 3 DAYS Performed at [facility], [address] [place], [place] [address]    Report Status PENDING  Incomplete  Resp Panel by RT-PCR (Flu A&B, Covid) Nasopharyngeal Swab     Status: None   Collection Time: 03/29/21  3:12 PM   Specimen: Nasopharyngeal Swab; Nasopharyngeal(NP) swabs in vial transport medium  Result Value Ref Range Status   SARS Coronavirus 2 by RT PCR NEGATIVE NEGATIVE Final    Comment: (NOTE) SARS-CoV-2 target nucleic acids are NOT DETECTED.  The SARS-CoV-2 RNA is generally detectable in upper respiratory specimens during the acute phase of infection. The lowest concentration  of SARS-CoV-2 viral copies this assay can detect is 138 copies/mL. A negative result does not preclude SARS-Cov-2 infection and should not be used as the sole basis for treatment or other patient management decisions. A negative result may occur with  improper specimen collection/handling, submission of specimen other than nasopharyngeal swab, presence of viral mutation(s) within the areas targeted by this assay, and inadequate number of viral copies(<138 copies/mL). A negative result must be combined with clinical observations, patient history, and epidemiological information. The expected result is Negative.  Fact Sheet for Patients:  BloggerCourse.com  Fact Sheet for Healthcare Providers:  SeriousBroker.it  This test is no t yet approved or cleared by the [place] FDA and  has been authorized for detection and/or diagnosis of SARS-CoV-2 by FDA under an Emergency Use Authorization (EUA). This EUA will remain  in effect (meaning this test can be used) for the duration of the COVID-19 declaration under Section 564(b)(1) of the Act, 21 U.S.C.section 360bbb-3(b)(1), unless the authorization is terminated  or revoked sooner.       Influenza A by PCR NEGATIVE NEGATIVE Final   Influenza B by PCR NEGATIVE NEGATIVE Final    Comment: (NOTE) The Xpert Xpress SARS-CoV-2/FLU/RSV plus assay is intended as an aid in the diagnosis of influenza from Nasopharyngeal swab specimens and should not be used as a sole basis for treatment. Nasal washings and aspirates are unacceptable for Xpert Xpress SARS-CoV-2/FLU/RSV testing.  Fact Sheet for Patients: BloggerCourse.com  Fact Sheet for Healthcare Providers: SeriousBroker.it  This test is not yet approved or cleared by the [place] FDA and has been authorized for detection and/or diagnosis of SARS-CoV-2 by FDA under an Emergency Use  Authorization (EUA). This EUA will remain in effect (meaning this test can be used) for the duration of the COVID-19 declaration under Section 564(b)(1) of the Act, 21 U.S.C. section 360bbb-3(b)(1), unless the authorization is terminated or revoked.  Performed at [facility], [address], [place], [place] [address]     Impression/Plan:  1. Encephalitis/myelitis - some improvement in his hands and remains on steroids.  No etiology identified yet, waiting on test results.    2.  Paraplegia - no movement in legs still and on steroids for possible benefit for reduction of inflammatory response.  Will likely need rehab at the time of discharge.    3.  Rash - some improvement of the left groin rash  and on ceftriaxone.  Will continue this for now.

## 2021-04-01 NOTE — Progress Notes (Signed)
PROGRESS NOTE    James Obrien  BMW:[id number] DOB: 17-Aug-1985 DOA: 03/28/2021 PCP: Pcp, No   Chief Complaint  Patient presents with   Seizures   Brief Narrative/Hospital Course: 35 year old male with with no known significant medical history brought to the ED with witnessed generalized tonic-clonic seizure x2.Symptom onset around 9 AM at work with tingling and numbness in his left hand subsequently around noon. Patient since Monday PTA complaining of left groin area of bite with a lesion on the suprapubic left groin area and some on his penis with some discharge. Recent Gonorrhea + and treated with rocephin x1. In the ED patient was mildly febrile lactic acidosis. UA unremarkable, chest x-ray no acute finding , RPR HIV negative, blood cultures x2 sets ordered underwent CT head no acute finding subsequently MRI brain without contrast-multifocal abnormal hyperintense T2 weighted signal within both hemisphere more on the right concerning for acute demyelination, lymphoma is a secondary possibility, underwent MRI brain with contrast inflammatory/demyelinating process again favored CT abdomen pelvis improved left groin inflammatory process/cellulitis, pelvic and inguinal lymphadenopathy. Episode of bradycardia in 30s mostly asleep was given atropine in the ED. Patient was admitted seen by neurology-underwent lumbar puncture empirically started on meningitis treatment. He was seen by cardiology and posterior neurology on board. 10/12 was complaining of lower leg weakness MRI T-spine LS-spine showed extensive cord signal abnormality longitudinally/concern for myelitis no hematoma other other complications LP.  Subjective: Seen and examined this morning.  Wife at the bedside.  Patient able to move his left upper extremity more which she was not able to do yesterday.  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  Assessment & Plan:  Encephalitis-presenting with  seizures-resolved Paraplegia: Myelitis noted in the MRI L/T spine RUE weakness since admit: Patient has gone extensive evaluation with lumbar puncture-showed CSF leukocytosis, gram stain culture no growth. Neurology and ID following.  EEG-cortical dysfunction from left hemisphere nonspecific.possiblilities include infectious encephalomyelitis like West Nile virus status versus inflammatory encephalomyelitis. Mentation improving some but is paraplegic now, weakness on the right upper extremity some better this morning. On empiric ceftriaxone/acyclovir and keppra. Started on IV Solu-Medrol given patient's paraplegia.Follow-up autoimmune/inflammatory markers, PCR.  Continue plan of care as per neurology, ID.  PT OT evaluation.  Dull affects ?question depression.  Mood stable this morning  Sinus bradycardia mostly in sleep.  Echocardiogram unremarkable.  Seen by cardiology  Mild leukocytosis suspect due to steroid.  Monitor  Staph epidermidis in blood culture, suspected contamination  Urine retention suspect in the setting of #1 has a foley catheter since admission  Diet Order             Diet regular Room service appropriate? Yes; Fluid consistency: Thin  Diet effective now                  DVT prophylaxis: enoxaparin (LOVENOX) injection 40 mg Start: 03/30/21 2000 Code Status:   Code Status: Full Code  Family Communication: plan of care discussed with patient at and his wife at bedside. Status is: Inpatient Remains inpatient appropriate because:IV treatments appropriate due to intensity of illness or inability to take PO and Inpatient level of care appropriate due to severity of illness Dispo: The patient is from: Home              Anticipated d/c is to:  TBD              Patient currently is not medically stable to d/c.   Difficult to  place patient No Objective: Vitals: Today's Vitals   03/31/21 1553 03/31/21 2021 04/01/21 0032 04/01/21 0408  BP: (!) 115/58 119/68 (!) 102/55 (!)  98/53  Pulse: 62 (!) 53 (!) 51 (!) 54  Resp: Temp: 98 F (36.7 C) 98.4 F (36.9 C) 97.6 F (36.4 C) 97.9 F (36.6 C)  TempSrc: Oral Oral Oral Oral  SpO2: 98% 98% 99% 98%  Weight:      Height:      PainSc:  0-No pain     Physical Examination: General exam: AAX3, pleasant, not in distress.   HEENT:Oral mucosa moist, Ear/Nose WNL grossly,dentition normal. Respiratory system: B/l clear BS, no use of accessory muscle, non tender. Cardiovascular system: S1 & S2 +,No JVD. Gastrointestinal system: Abdomen soft, NT,ND, BS+. Nervous System:Alert, awake, decreased sensation lower extremities and grossly weak bilaterally, weak RUE more than left. Extremities: Leg edema -, distal peripheral pulses palpable.  Skin: No rashes, no icterus. MSK: Normal muscle bulk,tone, power.  Medications reviewed:  Scheduled Meds:  Chlorhexidine Gluconate Cloth  6 each Topical Daily   enoxaparin (LOVENOX) injection  40 mg Subcutaneous Q24H   levETIRAcetam  500 mg Oral BID   tamsulosin  0.4 mg Oral Daily   Continuous Infusions:  sodium chloride 125 mL/hr at 04/01/21 0647   acyclovir Stopped (04/01/21 0607)   cefTRIAXone (ROCEPHIN)  IV Stopped (04/01/21 0447)   methylPREDNISolone (SOLU-MEDROL) injection Stopped (03/31/21 1558)    Intake/Output  Intake/Output Summary (Last 24 hours) at 04/01/2021 0721 Last data filed at 04/01/2021 0600 Gross per 24 hour  Intake 4096.52 ml  Output 2800 ml  Net 1296.52 ml   Intake/Output from previous day: 10/02 0701 - 10/03 0700 In: 4096.5 [P.O.:480; I.V.:2701.6; IV Piggyback:914.9] Out: 2800 [Urine:2800] Net IO Since Admission: 2,560.48 mL [04/01/21 0721]   Weight change:   Wt Readings from Last 3 Encounters:  03/28/21 74.8 kg  03/25/21 68.9 kg     Consultants: Neurology Cardiology Infectious disease   Procedures: Lp  Antimicrobials: Anti-infectives (From admission, onward)    Start     Dose/Rate Route Frequency Ordered Stop   03/30/21  0600  cefTRIAXone (ROCEPHIN) 2 g in sodium chloride 0.9 % 100 mL IVPB        2 g 200 mL/hr over 30 Minutes Intravenous Every 12 hours 03/29/21 2059     03/30/21 0600  vancomycin (VANCOCIN) IVPB 1000 mg/200 mL premix  Status:  Discontinued        1,000 mg 200 mL/hr over 60 Minutes Intravenous Every 8 hours 03/29/21 2119 03/31/21 1306   03/29/21 2215  vancomycin (VANCOREADY) IVPB 1750 mg/350 mL        1,750 mg 175 mL/hr over 120 Minutes Intravenous  Once 03/29/21 2119 03/30/21 0046   03/29/21 2215  ampicillin (OMNIPEN) 2 g in sodium chloride 0.9 % 100 mL IVPB  Status:  Discontinued        2 g 300 mL/hr over 20 Minutes Intravenous Every 4 hours 03/29/21 2119 03/31/21 1305   03/29/21 1700  cefTRIAXone (ROCEPHIN) 1 g in sodium chloride 0.9 % 100 mL IVPB  Status:  Discontinued        1 g 200 mL/hr over 30 Minutes Intravenous Every 24 hours 03/29/21 0820 03/29/21 2059   03/29/21 1400  acyclovir (ZOVIRAX) 750 mg in dextrose 5 % 150 mL IVPB        750 mg 165 mL/hr over 60 Minutes Intravenous Every 8 hours 03/29/21 1212  Culture/Microbiology    Component Value Date/Time   SDES CSF 03/29/2021 1453   SPECREQUEST NONE 03/29/2021 1453   CULT  03/29/2021 1453    NO GROWTH 2 DAYS Performed at [facility], [address] [place], [place] [address]    REPTSTATUS PENDING 03/29/2021 1453    Other culture-see note  Unresulted Labs (From admission, onward)     Start     Ordered   03/30/21 1502  Angiotensin converting enzyme  Once,   R        03/30/21 1502   03/30/21 1502  HIV-1 RNA quant-no reflex-bld  Once,   R        03/30/21 1502   03/30/21 1502  Neuromyelitis optica autoab, IgG  Once,   R        03/30/21 1502   03/30/21 1447  VDRL, CSF  Once,   AD        03/30/21 1447   03/29/21 1452  VZV PCR, CSF  Once,   R        03/29/21 1451   03/29/21 1450  Miscellaneous [facility] test (send-out)  Once,   R       Question:  Test name / description:  Answer:  Meningitis/Encephalitis  Panel by PCR to [facility]   03/29/21 1450   03/29/21 1449  HSV 1/2 PCR, CSF Cerebrospinal Fluid  Once,   R        03/29/21 1448   03/29/21 1449  Fungus Culture With Stain  Once,   R       Comments: Run on CSF and not on Blood  Question:  Release to patient  Answer:  Immediate   03/29/21 1448   03/29/21 1449  Arbovirus panel [facility], etc)-perf at [facility] state lab  Once,   R        03/29/21 1449   03/29/21 1449  Pathologist smear review  Once,   R        03/29/21 1449   03/28/21 1447  Urine rapid drug screen (hosp performed)  ONCE - STAT,   STAT        03/28/21 1446            Data Reviewed: I have personally reviewed following labs and imaging studies CBC: Recent Labs  Lab 03/25/21 1705 03/28/21 1444 03/28/21 1525 03/29/21 0438 03/30/21 0942 03/31/21 1018 04/01/21 0330  WBC 7.2 10.8*  --  10.1 10.6* 10.7* 12.2*  NEUTROABS 4.4 7.8*  --   --  7.6 8.0* 9.9*  HGB 14.3 12.9* 11.6* 11.2* 11.8* 13.2 11.9*  HCT 41.1 39.2 34.0* 34.8* 37.1* 39.0 35.0*  MCV 88.8 92.9  --  94.1 94.2 91.3 90.2  PLT 181 214  --  222 215 259 271   Basic Metabolic Panel: Recent Labs  Lab 03/28/21 1444 03/28/21 1525 03/28/21 1930 03/29/21 0438 03/30/21 0135 03/31/21 1018 04/01/21 0330  NA 138 140  --  138 137 139 138  K 4.8 4.3  --  3.4* 3.8 4.2 4.1  CL 104 104  --  109 109 107 106  CO2 23  --   --  21* 18* 22 24  GLUCOSE 103* 102*  --  100* 97 120* 136*  BUN 13 12  --  CREATININE 1.27* 1.10  --  1.00 1.10 0.74 0.77  CALCIUM 8.7*  --   --  8.4* 8.0* 8.7* 8.5*  MG  --   --  2.3  --  1.9  --   --    GFR: Estimated Creatinine Clearance: 139 mL/min (by C-G formula based on SCr of 0.77 mg/dL). Liver Function Tests: Recent Labs  Lab 03/25/21 1705 03/28/21 1444  AST 35 36  ALT 31 25  ALKPHOS 45 39  BILITOT 0.7 0.4  PROT 8.1 6.9  ALBUMIN 4.3 3.6   No results for input(s): LIPASE, AMYLASE in the last 168 hours. No results for input(s): AMMONIA in the last 168 hours. Coagulation  Profile: Recent Labs  Lab 03/28/21 1444  INR 1.1   Cardiac Enzymes: No results for input(s): CKTOTAL, CKMB, CKMBINDEX, TROPONINI in the last 168 hours. BNP (last 3 results) No results for input(s): PROBNP in the last 8760 hours. HbA1C: No results for input(s): HGBA1C in the last 72 hours. CBG: No results for input(s): GLUCAP in the last 168 hours. Lipid Profile: No results for input(s): CHOL, HDL, LDLCALC, TRIG, CHOLHDL, LDLDIRECT in the last 72 hours. Thyroid Function Tests: No results for input(s): TSH, T4TOTAL, FREET4, T3FREE, THYROIDAB in the last 72 hours. Anemia Panel: No results for input(s): VITAMINB12, FOLATE, FERRITIN, TIBC, IRON, RETICCTPCT in the last 72 hours. Sepsis Labs: Recent Labs  Lab 03/25/21 1705 03/28/21 1444 03/28/21 1908  LATICACIDVEN 0.9 4.1* 1.0    Recent Results (from the past 240 hour(s))  Culture, blood (Routine X 2) w Reflex to ID Panel     Status: None   Collection Time: 03/25/21  5:04 PM   Specimen: BLOOD  Result Value Ref Range Status   Specimen Description   Final    BLOOD RIGHT ANTECUBITAL Performed at [facility], [address] [place], [place] [address]    Special Requests   Final    BOTTLES DRAWN AEROBIC AND ANAEROBIC Blood Culture adequate volume Performed at [facility], [address] [place], [place] [address]    Culture   Final    NO GROWTH 5 DAYS Performed at [facility], [address] [place], [place] [address]    Report Status 03/30/2021 FINAL  Final  Culture, blood (Routine X 2) w Reflex to ID Panel     Status: None   Collection Time: 03/25/21  5:12 PM   Specimen: BLOOD  Result Value Ref Range Status   Specimen Description   Final    BLOOD LEFT ANTECUBITAL Performed at [facility], [address], [place], [place] [address]    Special Requests   Final    BOTTLES DRAWN AEROBIC AND ANAEROBIC Blood Culture adequate volume Performed at [facility], [address]  [place], [place] [address]    Culture   Final    NO GROWTH 5 DAYS Performed at [facility], [address] [place], [place] [address]    Report Status 03/30/2021 FINAL  Final  Urine Culture     Status: None   Collection Time: 03/28/21  2:44 PM   Specimen: In/Out Cath Urine  Result Value Ref Range Status   Specimen Description IN/OUT CATH URINE  Final   Special Requests NONE  Final   Culture   Final    NO GROWTH Performed at [facility], [address] [place], [place] [address]    Report Status 03/29/2021 FINAL  Final  Blood Culture (routine x 2)     Status: None (Preliminary result)   Collection Time: 03/28/21  2:45 PM   Specimen: BLOOD  Result Value Ref Range Status  Specimen Description BLOOD RIGHT UPPER ARM  Final   Special Requests   Final    BOTTLES DRAWN AEROBIC AND ANAEROBIC Blood Culture results may not be optimal due to an excessive volume of blood received in culture bottles   Culture   Final    NO GROWTH 4 DAYS Performed at [facility], [address] [place], [place] [address]    Report Status PENDING  Incomplete  Blood Culture (routine x 2)     Status: Abnormal   Collection Time: 03/28/21  2:50 PM   Specimen: BLOOD  Result Value Ref Range Status   Specimen Description BLOOD LEFT ANTECUBITAL  Final   Special Requests   Final    BOTTLES DRAWN AEROBIC AND ANAEROBIC Blood Culture results may not be optimal due to an excessive volume of blood received in culture bottles   Culture  Setup Time   Final    GRAM POSITIVE COCCI IN CLUSTERS IN BOTH AEROBIC AND ANAEROBIC BOTTLES CRITICAL RESULT CALLED TO, READ BACK BY AND VERIFIED WITH: PHARMD E.SINCLAIR AT 1255 ON 03/29/2021 BY T.SAAD.    Culture (A)  Final    STAPHYLOCOCCUS EPIDERMIDIS THE SIGNIFICANCE OF ISOLATING THIS ORGANISM FROM A SINGLE SET OF BLOOD CULTURES WHEN MULTIPLE SETS ARE DRAWN IS UNCERTAIN. PLEASE NOTIFY THE MICROBIOLOGY DEPARTMENT WITHIN ONE WEEK IF SPECIATION AND SENSITIVITIES ARE  REQUIRED. Performed at [facility], [address] [place], [place] [address]    Report Status 03/31/2021 FINAL  Final  Blood Culture ID Panel (Reflexed)     Status: Abnormal   Collection Time: 03/28/21  2:50 PM  Result Value Ref Range Status   Enterococcus faecalis NOT DETECTED NOT DETECTED Final   Enterococcus Faecium NOT DETECTED NOT DETECTED Final   Listeria monocytogenes NOT DETECTED NOT DETECTED Final   Staphylococcus species DETECTED (A) NOT DETECTED Final    Comment: CRITICAL RESULT CALLED TO, READ BACK BY AND VERIFIED WITH: PHARMD E.SINCLAIR AT 1255 ON 03/29/2021 BY T.SAAD.    Staphylococcus aureus (BCID) NOT DETECTED NOT DETECTED Final   Staphylococcus epidermidis DETECTED (A) NOT DETECTED Final    Comment: CRITICAL RESULT CALLED TO, READ BACK BY AND VERIFIED WITH: PHARMD E.SINCLAIR AT 1255 ON 03/29/2021 BY T.SAAD.    Staphylococcus lugdunensis NOT DETECTED NOT DETECTED Final   Streptococcus species NOT DETECTED NOT DETECTED Final   Streptococcus agalactiae NOT DETECTED NOT DETECTED Final   Streptococcus pneumoniae NOT DETECTED NOT DETECTED Final   Streptococcus pyogenes NOT DETECTED NOT DETECTED Final   A.calcoaceticus-baumannii NOT DETECTED NOT DETECTED Final   Bacteroides fragilis NOT DETECTED NOT DETECTED Final   Enterobacterales NOT DETECTED NOT DETECTED Final   Enterobacter cloacae complex NOT DETECTED NOT DETECTED Final   Escherichia coli NOT DETECTED NOT DETECTED Final   Klebsiella aerogenes NOT DETECTED NOT DETECTED Final   Klebsiella oxytoca NOT DETECTED NOT DETECTED Final   Klebsiella pneumoniae NOT DETECTED NOT DETECTED Final   Proteus species NOT DETECTED NOT DETECTED Final   Salmonella species NOT DETECTED NOT DETECTED Final   Serratia marcescens NOT DETECTED NOT DETECTED Final   Haemophilus influenzae NOT DETECTED NOT DETECTED Final   Neisseria meningitidis NOT DETECTED NOT DETECTED Final   Pseudomonas aeruginosa NOT DETECTED NOT DETECTED Final    Stenotrophomonas maltophilia NOT DETECTED NOT DETECTED Final   Candida albicans NOT DETECTED NOT DETECTED Final   Candida auris NOT DETECTED NOT DETECTED Final   Candida glabrata NOT DETECTED NOT DETECTED Final   Candida krusei NOT DETECTED NOT DETECTED Final  Candida parapsilosis NOT DETECTED NOT DETECTED Final   Candida tropicalis NOT DETECTED NOT DETECTED Final   Cryptococcus neoformans/gattii NOT DETECTED NOT DETECTED Final   Methicillin resistance mecA/C NOT DETECTED NOT DETECTED Final    Comment: Performed at [facility], [address] [place], [place] [address]  CSF culture w Stat Gram Stain     Status: None (Preliminary result)   Collection Time: 03/29/21  2:53 PM   Specimen: CSF; Cerebrospinal Fluid  Result Value Ref Range Status   Specimen Description CSF  Final   Special Requests NONE  Final   Gram Stain   Final    WBC PRESENT,BOTH PMN AND MONONUCLEAR NO ORGANISMS SEEN CYTOSPIN SMEAR    Culture   Final    NO GROWTH 2 DAYS Performed at [facility], [address] [place], [place] [address]    Report Status PENDING  Incomplete  Resp Panel by RT-PCR (Flu A&B, Covid) Nasopharyngeal Swab     Status: None   Collection Time: 03/29/21  3:12 PM   Specimen: Nasopharyngeal Swab; Nasopharyngeal(NP) swabs in vial transport medium  Result Value Ref Range Status   SARS Coronavirus 2 by RT PCR NEGATIVE NEGATIVE Final    Comment: (NOTE) SARS-CoV-2 target nucleic acids are NOT DETECTED.  The SARS-CoV-2 RNA is generally detectable in upper respiratory specimens during the acute phase of infection. The lowest concentration of SARS-CoV-2 viral copies this assay can detect is 138 copies/mL. A negative result does not preclude SARS-Cov-2 infection and should not be used as the sole basis for treatment or other patient management decisions. A negative result may occur with  improper specimen collection/handling, submission of specimen other than nasopharyngeal swab,  presence of viral mutation(s) within the areas targeted by this assay, and inadequate number of viral copies(<138 copies/mL). A negative result must be combined with clinical observations, patient history, and epidemiological information. The expected result is Negative.  Fact Sheet for Patients:  BloggerCourse.com  Fact Sheet for Healthcare Providers:  SeriousBroker.it  This test is no t yet approved or cleared by the [place] FDA and  has been authorized for detection and/or diagnosis of SARS-CoV-2 by FDA under an Emergency Use Authorization (EUA). This EUA will remain  in effect (meaning this test can be used) for the duration of the COVID-19 declaration under Section 564(b)(1) of the Act, 21 U.S.C.section 360bbb-3(b)(1), unless the authorization is terminated  or revoked sooner.       Influenza A by PCR NEGATIVE NEGATIVE Final   Influenza B by PCR NEGATIVE NEGATIVE Final    Comment: (NOTE) The Xpert Xpress SARS-CoV-2/FLU/RSV plus assay is intended as an aid in the diagnosis of influenza from Nasopharyngeal swab specimens and should not be used as a sole basis for treatment. Nasal washings and aspirates are unacceptable for Xpert Xpress SARS-CoV-2/FLU/RSV testing.  Fact Sheet for Patients: BloggerCourse.com  Fact Sheet for Healthcare Providers: SeriousBroker.it  This test is not yet approved or cleared by the [place] FDA and has been authorized for detection and/or diagnosis of SARS-CoV-2 by FDA under an Emergency Use Authorization (EUA). This EUA will remain in effect (meaning this test can be used) for the duration of the COVID-19 declaration under Section 564(b)(1) of the Act, 21 U.S.C. section 360bbb-3(b)(1), unless the authorization is terminated or revoked.  Performed at [facility], [address] [place], [place] [address]      Radiology  Studies: MR THORACIC SPINE WO CONTRAST  Result Date: 03/30/2021 CLINICAL DATA:  Recent LP, evaluate for epidural collection. EXAM: MRI THORACIC AND LUMBAR SPINE WITHOUT CONTRAST TECHNIQUE: Multiplanar and multiecho pulse sequences of the thoracic and lumbar spine were obtained without intravenous contrast. COMPARISON:  None. FINDINGS: MRI THORACIC SPINE FINDINGS Alignment:  Normal Vertebrae: No fracture, evidence of discitis, or bone lesion. No evidence of bone infarct Cord: Diffuse T2 hyperintensity in the cord, primarily central with mild swollen appearance. There is likely cervical involvement as well, but partially covered. Paraspinal and other soft tissues: No evidence of perispinal mass or inflammation. Disc levels: Small disc protrusions at T7-8 and T8-9 which contact but do not compress the cord. MRI LUMBAR SPINE FINDINGS Segmentation:  5 lumbar type vertebrae Alignment:  Normal Vertebrae:  No fracture, evidence of discitis, or bone lesion. Conus medullaris and cauda equina: Conus extends to the L1 level. Mild T2 hyperintensity and swelling at the level of the conus. No cauda equina thickening, nodularity, or distortion. Paraspinal and other soft tissues: Interspinous T2 hyperintensity at L4-5 which is likely from the lumbar puncture. No collection. Disc levels: Well preserved disc height and hydration. Negative facets. No neural impingement. IMPRESSION: 1. Longitudinally extensive cord signal abnormality/myelitis. 2. No hematoma or other complication related to lumbar puncture. Electronically Signed   By: Tiburcio Pea M.D.   On: 03/30/2021 11:11   MR LUMBAR SPINE WO CONTRAST  Result Date: 03/30/2021 CLINICAL DATA:  Recent LP, evaluate for epidural collection. EXAM: MRI THORACIC AND LUMBAR SPINE WITHOUT CONTRAST TECHNIQUE: Multiplanar and multiecho pulse sequences of the thoracic and lumbar spine were obtained without intravenous contrast. COMPARISON:  None. FINDINGS: MRI THORACIC SPINE FINDINGS  Alignment:  Normal Vertebrae: No fracture, evidence of discitis, or bone lesion. No evidence of bone infarct Cord: Diffuse T2 hyperintensity in the cord, primarily central with mild swollen appearance. There is likely cervical involvement as well, but partially covered. Paraspinal and other soft tissues: No evidence of perispinal mass or inflammation. Disc levels: Small disc protrusions at T7-8 and T8-9 which contact but do not compress the cord. MRI LUMBAR SPINE FINDINGS Segmentation:  5 lumbar type vertebrae Alignment:  Normal Vertebrae:  No fracture, evidence of discitis, or bone lesion. Conus medullaris and cauda equina: Conus extends to the L1 level. Mild T2 hyperintensity and swelling at the level of the conus. No cauda equina thickening, nodularity, or distortion. Paraspinal and other soft tissues: Interspinous T2 hyperintensity at L4-5 which is likely from the lumbar puncture. No collection. Disc levels: Well preserved disc height and hydration. Negative facets. No neural impingement. IMPRESSION: 1. Longitudinally extensive cord signal abnormality/myelitis. 2. No hematoma or other complication related to lumbar puncture. Electronically Signed   By: Tiburcio Pea M.D.   On: 03/30/2021 11:11   ECHOCARDIOGRAM COMPLETE  Result Date: 03/30/2021    ECHOCARDIOGRAM REPORT   Patient Name:   James Obrien Date of Exam: 03/30/2021 Medical Rec #:  [id number]           Height:       71.0 in Accession #:    [id number]          Weight:       165.0 lb Date of Birth:  1986-04-12            BSA:          1.943 m Patient Age:    35 years            BP:           121/64 mmHg Patient Gender: M  HR:           79 bpm. Exam Location:  Inpatient Procedure: 2D Echo, Cardiac Doppler and Color Doppler Indications:    Other abnormalities of the heart  History:        Patient has no prior history of Echocardiogram examinations.                 Arrythmias:Bradycardia. Seizure.  Sonographer:    Ross Ludwig RDCS  (AE) Referring Phys: [phone] THOMAS A KELLY IMPRESSIONS  1. Left ventricular ejection fraction, by estimation, is 55 to 60%. The left ventricle has normal function. The left ventricle has no regional wall motion abnormalities. Left ventricular diastolic parameters were normal.  2. Right ventricular systolic function is normal. The right ventricular size is normal. Tricuspid regurgitation signal is inadequate for assessing PA pressure.  3. There is a trivial pericardial effusion posterior to the left ventricle.  4. The mitral valve is grossly normal. Trivial mitral valve regurgitation.  5. The aortic valve is tricuspid. Aortic valve regurgitation is not visualized. No aortic stenosis is present. Aortic valve mean gradient measures 6.0 mmHg.  6. The inferior vena cava is normal in size with <50% respiratory variability, suggesting right atrial pressure of 8 mmHg. Comparison(s): No prior Echocardiogram. FINDINGS  Left Ventricle: Left ventricular ejection fraction, by estimation, is 55 to 60%. The left ventricle has normal function. The left ventricle has no regional wall motion abnormalities. The left ventricular internal cavity size was normal in size. There is  no left ventricular hypertrophy. Left ventricular diastolic parameters were normal. Right Ventricle: The right ventricular size is normal. No increase in right ventricular wall thickness. Right ventricular systolic function is normal. Tricuspid regurgitation signal is inadequate for assessing PA pressure. Left Atrium: Left atrial size was normal in size. Right Atrium: Right atrial size was normal in size. Pericardium: Trivial pericardial effusion is present. The pericardial effusion is posterior to the left ventricle. Mitral Valve: The mitral valve is grossly normal. Trivial mitral valve regurgitation. Tricuspid Valve: The tricuspid valve is grossly normal. Tricuspid valve regurgitation is trivial. Aortic Valve: The aortic valve is tricuspid. Aortic valve  regurgitation is not visualized. No aortic stenosis is present. Aortic valve mean gradient measures 6.0 mmHg. Aortic valve peak gradient measures 14.1 mmHg. Aortic valve area, by VTI measures 3.00  cm. Pulmonic Valve: The pulmonic valve was grossly normal. Pulmonic valve regurgitation is trivial. Aorta: The aortic root is normal in size and structure. Venous: The inferior vena cava is normal in size with less than 50% respiratory variability, suggesting right atrial pressure of 8 mmHg. IAS/Shunts: No atrial level shunt detected by color flow Doppler.  LEFT VENTRICLE PLAX 2D LVIDd:         4.90 cm  Diastology LVIDs:         3.50 cm  LV e' medial:    17.60 cm/s LV PW:         1.00 cm  LV E/e' medial:  6.3 LV IVS:        0.80 cm  LV e' lateral:   21.90 cm/s LVOT diam:     2.10 cm  LV E/e' lateral: 5.1 LV SV:         94 LV SV Index:   48 LVOT Area:     3.46 cm  RIGHT VENTRICLE             IVC RV Basal diam:  3.20 cm     IVC diam: 2.00 cm RV S prime:  16.20 cm/s TAPSE (M-mode): 4.0 cm LEFT ATRIUM           Index       RIGHT ATRIUM           Index LA diam:      2.50 cm 1.29 cm/m  RA Area:     16.60 cm LA Vol (A2C): 39.9 ml 20.53 ml/m RA Volume:   38.60 ml  19.87 ml/m LA Vol (A4C): 37.2 ml 19.15 ml/m  AORTIC VALVE AV Area (Vmax):    2.86 cm AV Area (Vmean):   3.07 cm AV Area (VTI):     3.00 cm AV Vmax:           188.00 cm/s AV Vmean:          109.000 cm/s AV VTI:            0.312 m AV Peak Grad:      14.1 mmHg AV Mean Grad:      6.0 mmHg LVOT Vmax:         155.00 cm/s LVOT Vmean:        96.600 cm/s LVOT VTI:          0.270 m LVOT/AV VTI ratio: 0.87  AORTA Ao Root diam: 3.30 cm Ao Asc diam:  2.90 cm MITRAL VALVE MV Area (PHT): 2.76 cm     SHUNTS MV Decel Time: 275 msec     Systemic VTI:  0.27 m MV E velocity: 111.00 cm/s  Systemic Diam: 2.10 cm MV A velocity: 43.30 cm/s MV E/A ratio:  2.56 Nona Dell MD Electronically signed by Nona Dell MD Signature Date/Time: 03/30/2021/2:07:45 PM    Final       LOS: 3 days   Lanae Boast, MD [facility]  04/01/2021, 7:21 AM

## 2021-04-01 NOTE — Progress Notes (Signed)
? ?  Inpatient Rehab Admissions Coordinator : ? ?Per therapy recommendations, patient was screened for CIR candidacy by Kolyn Rozario RN MSN.  At this time patient appears to be a potential candidate for CIR. I will place a rehab consult per protocol for full assessment. Please call me with any questions. ? ?Rajeev Escue RN MSN ?Admissions Coordinator ?[phone] ?  ?

## 2021-04-01 NOTE — TOC Initial Note (Signed)
Transition of Care [facility]) - Initial/Assessment Note    Patient Details  Name: James Obrien MRN: [id number] Date of Birth: [birth year]  [facility] CM/SW Contact:    Kermit Balo, RN Phone Number: 04/01/2021, 3:42 PM  Clinical Narrative:                 Patient is from home with spouse and children. TOC consulted as pt was the person working at time of admission and currently he is having paraplegia.  Both the patient and spouse were reluctant to share information with CM. They live in [place] but pt was working in [place] at time of hospitalization.  Pt doesn't have PCP, was not taking any home medications. Wife is able to provide transportation. CM will provide wife with information on resources to assist with bills and food in the [facility] area.  TOC following for therapy recommendations.  Expected Discharge Plan: IP Rehab Facility Barriers to Discharge: Continued Medical Work up   Patient Goals and CMS Choice        Expected Discharge Plan and Services Expected Discharge Plan: IP Rehab Facility   Discharge Planning Services: CM Consult   Living arrangements for the past 2 months: Mobile Home                                      Prior Living Arrangements/Services Living arrangements for the past 2 months: Mobile Home Lives with:: Minor Children, Spouse Patient language and need for interpreter reviewed:: Yes Do you feel safe going back to the place where you live?: Yes      Need for Family Participation in Patient Care: Yes (Comment) Care giver support system in place?: Yes (comment)   Criminal Activity/Legal Involvement Pertinent to Current Situation/Hospitalization: No - Comment as needed  Activities of Daily Living      Permission Sought/Granted                  Emotional Assessment Appearance:: Appears stated age Attitude/Demeanor/Rapport: Guarded Affect (typically observed): Quiet, Guarded Orientation: : Oriented  to Self, Oriented to Place, Oriented to  Time, Oriented to Situation   Psych Involvement: No (comment)  Admission diagnosis:  Seizure (HCC) [R56.9] Sepsis [facility]) [A41.9] Patient Active Problem List   Diagnosis Date Noted   Bradycardia    Seizure (HCC) 03/28/2021   Gonorrhea 03/28/2021   Cellulitis 03/28/2021   PCP:  Pcp, No Pharmacy:   [id number] - [place], [place] - [address] [place] [address] Phone: [phone] Fax: [phone]     Social Determinants of Health (SDOH) Interventions    Readmission Risk Interventions No flowsheet data found.

## 2021-04-01 NOTE — Evaluation (Signed)
Physical Therapy Evaluation Patient Details Name: James Obrien MRN: [id number] DOB: 10-31-1985 Today's Date: 04/01/2021  History of Present Illness  35 y.o. male with a PMHx of genitourinary lesions, groin erythema, and tobacco use who presented with multiple seizures described initially as left hand pins-and-needles sensation, left hand shaking, left eye twitching, progressing into GTC seizure lasting 2-5 minutes s/p Versed.  Patient was febrile at 103.1 F on 9/30 and was started on empiric acyclovir pending HSV PCR.  MRI positive for extensive myelitis.  Clinical Impression  Patient presents with decreased mobility due to bilateral LE weakness, L UE weakness, decreased sensation, decreased activity tolerance with symptoms of orthostatic hypotension in sitting, and decreased sitting balance.  Today he needed max A to get to EOB and immediately felt light headed.  BP testing difficult as he was unable to stay sitting for full measurement.  He was able to pull himself up in bed with R UE with mod A for LE's.  Previously worked in [employer] traveling in [employer] with his family to work sites.  He will benefit from skilled PT in the acute setting and from follow up CIR level rehab prior to d/c.     Recommendations for follow up therapy are one component of a multi-disciplinary discharge planning process, led by the attending physician.  Recommendations may be updated based on patient status, additional functional criteria and insurance authorization.  Follow Up Recommendations CIR    Equipment Recommendations  Wheelchair cushion (measurements PT);Wheelchair (measurements PT);3in1 (PT)    Recommendations for Other Services Rehab consult     Precautions / Restrictions Precautions Precautions: Fall Precaution Comments: paraparesis; check BP      Mobility  Bed Mobility Overal bed mobility: Needs Assistance Bed Mobility: Rolling;Sidelying to Sit;Sit to Supine;Supine to Sit Rolling: Mod  assist Sidelying to sit: Max assist Supine to sit: Max assist;HOB elevated Sit to supine: Max assist   General bed mobility comments: initially rolled and used rail to assist to upright with assist for legs off bed and finish lifting trunk with cues for hand placement; to supine quickly as pt symptomatic/light headed.  BP checked supine the assisted to upright with elevated HOB A for trunk and legs, pt still symptomatic and unable to stay upright for full BP measurement, lifted legs and lowered trunk max A.    Transfers                    Ambulation/Gait                Stairs            Wheelchair Mobility    Modified Rankin (Stroke Patients Only)       Balance Overall balance assessment: Needs assistance Sitting-balance support: Bilateral upper extremity supported Sitting balance-Leahy Scale: Poor Sitting balance - Comments: min to mod A given throughout with limited sitting tolerance                                     Pertinent Vitals/Pain Pain Assessment: No/denies pain    Home Living Family/patient expects to be discharged to:: Private residence Living Arrangements: Spouse/significant other (3 kids) Available Help at Discharge: Family;Available 24 hours/day Type of Home: Mobile home (camper) Home Access: Stairs to enter Entrance Stairs-Rails:  (grabbar at top and freezer beside stairs) Entrance Stairs-Number of Steps: 3 Home Layout: One level Home Equipment: None Additional Comments: 3 children (12/8/5  and wife homeschools)    Prior Function Level of Independence: Independent         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[employer], counselling        Extremity/Trunk Assessment   Upper Extremity Assessment Upper Extremity Assessment: RUE deficits/detail;LUE deficits/detail RUE Deficits / Details: AROM WFL, strength 5/5 throughout LUE Deficits / Details: AROM WFL, strength 3/5 grip, 3+/5 wrist flex/ext, 4-/5 elbow flexion,  3+/5 elbow extension, 4/5 shoulder flexion LUE Sensation: decreased light touch LUE Coordination: decreased fine motor    Lower Extremity Assessment Lower Extremity Assessment: RLE deficits/detail;LLE deficits/detail RLE Deficits / Details: PROM WFL, strength noted some reflexive movement in toes with sensory testing, some push toward hip extension versus knee extension with quick stretch leg in full flexion, but could not extend even to half the range, othewise pt unable to elicit movement RLE Sensation: decreased light touch (reports abnormal sensation below the waist) LLE Deficits / Details: PROM WFL, strength noted some reflexive movement in toes with sensory testing, some push toward hip extension versus knee extension with quick stretch leg in full flexion, but could not extend as much as R, othewise pt unable to elicit movement LLE Sensation: decreased light touch (reports abnormal sensation below the waist)       Communication   Communication: No difficulties  Cognition Arousal/Alertness: Awake/alert Behavior During Therapy: WFL for tasks assessed/performed Overall Cognitive Status: Within Functional Limits for tasks assessed                                 General Comments: not tested beyond orientation; per chart pt with word finding difficulties at times, today no issues noted with history      General Comments General comments (skin integrity, edema, etc.): BP 110/53 supine, 110/52 again in supine after sitting but unable to stay sitting for full measurement    Exercises Other Exercises Other Exercises: Educated wife in heel cord stretch with demonstration 20 sec each side Other Exercises: Educated in potential for need for ramp entry into camper if w/c will indeed fit, wife to take measurements; discussed possibly staying with his parents intially if needed   Assessment/Plan    PT Assessment Patient needs continued PT services  PT Problem List Decreased  strength;Decreased mobility;Decreased safety awareness;Decreased balance;Impaired sensation;Decreased knowledge of use of DME;Decreased activity tolerance;Decreased coordination       PT Treatment Interventions DME instruction;Therapeutic activities;Therapeutic exercise;Patient/family education;Balance training;Functional mobility training    PT Goals (Current goals can be found in the Care Plan section)  Acute Rehab PT Goals Patient Stated Goal: for legs to move PT Goal Formulation: With patient/family Time For Goal Achievement: 04/15/21 Potential to Achieve Goals: Good    Frequency Min 4X/week   Barriers to discharge        Co-evaluation               AM-PAC PT "6 Clicks" Mobility  Outcome Measure Help needed turning from your back to your side while in a flat bed without using bedrails?: A Lot Help needed moving from lying on your back to sitting on the side of a flat bed without using bedrails?: Total Help needed moving to and from a bed to a chair (including a wheelchair)?: Total Help needed standing up from a chair using your arms (e.g., wheelchair or bedside chair)?: Total Help needed to walk in hospital room?: Total Help needed climbing 3-5 steps with a  railing? : Total 6 Click Score: 7    End of Session   Activity Tolerance: Treatment limited secondary to medical complications (Comment) (symptoms of orthostatic hypotension) Patient left: in bed;with call bell/phone within reach;with family/visitor present   PT Visit Diagnosis: Other abnormalities of gait and mobility (R26.89);Muscle weakness (generalized) (M62.81);Other (comment) (paraparesis)    Time: 7628-3151 PT Time Calculation (min) (ACUTE ONLY): 32 min   Charges:   PT Evaluation $PT Eval Moderate Complexity: 1 Mod PT Treatments $Therapeutic Activity: 8-22 mins        Sheran Lawless, PT Acute Rehabilitation Services [phone] [phone] 04/01/2021   James Obrien 04/01/2021,  5:36 PM

## 2021-04-02 DIAGNOSIS — R569 Unspecified convulsions: Secondary | ICD-10-CM | POA: Diagnosis not present

## 2021-04-02 LAB — CULTURE, BLOOD (ROUTINE X 2): Culture: NO GROWTH

## 2021-04-02 LAB — CSF CULTURE W GRAM STAIN: Culture: NO GROWTH

## 2021-04-02 LAB — VDRL, CSF: VDRL Quant, CSF: NONREACTIVE

## 2021-04-02 LAB — URINALYSIS, ROUTINE W REFLEX MICROSCOPIC
Bilirubin Urine: NEGATIVE
Glucose, UA: NEGATIVE mg/dL
Hgb urine dipstick: NEGATIVE
Ketones, ur: NEGATIVE mg/dL
Leukocytes,Ua: NEGATIVE
Nitrite: NEGATIVE
Protein, ur: NEGATIVE mg/dL
Specific Gravity, Urine: 1.016 (ref 1.005–1.030)
pH: 5 (ref 5.0–8.0)

## 2021-04-02 LAB — HSV 1/2 PCR, CSF
HSV-1 DNA: NEGATIVE
HSV-2 DNA: NEGATIVE

## 2021-04-02 NOTE — Progress Notes (Signed)
Pt had been followed by cardiology for bradycardia    Review of tele, no significant bradycardia seen Will sign off  Please call with question.     Dietrich Pates MD

## 2021-04-02 NOTE — Progress Notes (Signed)
PROGRESS NOTE    James Obrien  ENI:[id number] DOB: Jan 14, 1986 DOA: 03/28/2021 PCP: Pcp, No   Chief Complaint  Patient presents with   Seizures   Brief Narrative/Hospital Course: 35 year old male with with no known significant medical history brought to the ED with witnessed generalized tonic-clonic seizure x2.Symptom onset around 9 AM at work with tingling and numbness in his left hand subsequently around noon. Patient since Monday PTA complaining of left groin area of bite with a lesion on the suprapubic left groin area and some on his penis with some discharge. Recent Gonorrhea + and treated with rocephin x1. In the ED patient was mildly febrile lactic acidosis. UA unremarkable, chest x-ray no acute finding , RPR HIV negative, blood cultures x2 sets ordered underwent CT head no acute finding subsequently MRI brain without contrast-multifocal abnormal hyperintense T2 weighted signal within both hemisphere more on the right concerning for acute demyelination, lymphoma is a secondary possibility, underwent MRI brain with contrast inflammatory/demyelinating process again favored CT abdomen pelvis improved left groin inflammatory process/cellulitis, pelvic and inguinal lymphadenopathy. Episode of bradycardia in 30s mostly asleep was given atropine in the ED. Patient was admitted seen by neurology-underwent lumbar puncture empirically started on meningitis treatment. He was seen by cardiology and posterior neurology on board. 10/12 was complaining of lower leg weakness MRI T-spine LS-spine showed extensive cord signal abnormality longitudinally/concern for myelitis no hematoma other other complications LP.   Subjective: Seen and examined.  Wife is at the bedside. Overnight patient had left-sided chest pain serial troponins are negative and EKG NSR no ischemic cahnges. Doing well on room air.  No chest pain. Able to move her left upper extremity well Unable to move lower legs  still. 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 Neurology and ID following.  EEG-cortical dysfunction from left hemisphere nonspecific. Possiblilities include infectious encephalomyelitis like West Nile virus versus inflammatory encephalomyelitis.  Mentation improved but still no movement and lower extremities-patient has been started on IV steroid high-dose-completed 3 days 04/01/21. On empiric ceftriaxone/acyclovir-HCV PCR negative.On Keppra.  Infectious disease and neurology following defer the antibiotics/antiviral to ID/neruo.Follow-up autoimmune/inflammatory markers, PCR.  PT OT to continue, potential CIR.   Dull affects ?question depression. Stable  Sinus bradycardia mostly in sleep.  Echocardiogram unremarkable.  Needed atropine in the ED.  Seen by cardiology no further work-up.   Mild leukocytosis suspect due to steroid.  Monitor temperature curve  Staph epidermidis in blood culture, suspected contamination  Urine retention suspect in the setting of #1 has a foley catheter since admission-we will do voiding trial tomorrow (ordered).  Continue Flomax  Diet Order             Diet regular Room service appropriate? Yes; Fluid consistency: Thin  Diet effective now                  DVT prophylaxis: enoxaparin (LOVENOX) injection 40 mg Start: 03/30/21 2000 Code Status:   Code Status: Full Code  Family Communication: plan of care discussed with patient at and his wife at bedside. Status is: Inpatient Remains inpatient appropriate because:IV treatments appropriate due to intensity of illness or inability to take PO and Inpatient level of care appropriate due to severity of illness Dispo: The patient is from: Home              Anticipated d/c is to:  CIR  Patient  currently is not medically stable to d/c.   Difficult to place patient No Objective: Vitals: Today's Vitals   04/01/21 2028 04/02/21 0014 04/02/21 0518 04/02/21 0900  BP: (!) 108/57 100/68 109/69 114/63  Pulse: (!) 57 60 (!) 45 (!) 57  Resp: 17 17 18    Temp: 98.3 F (36.8 C) (!) 97.5 F (36.4 C) 97.9 F (36.6 C) 97.7 F (36.5 C)  TempSrc: Oral Oral Oral Oral  SpO2: 96% 96% 98% 100%  Weight:      Height:      PainSc: 0-No pain 0-No pain 0-No pain    Physical Examination: General exam: AAOx 3, pleasant HEENT:Oral mucosa moist, Ear/Nose WNL grossly, dentition normal. Respiratory system: bilaterally clear breath sounds, no use of accessory muscle Cardiovascular system: S1 & S2 +, No JVD,. Gastrointestinal system: Abdomen soft, NT,ND, BS+ Nervous System:Alert, awake, weaker LUE, unabel to move B/L LE w/ diminished sensation Extremities: No edema, distal peripheral pulses palpable.  Skin: No rashes,no icterus. MSK: Normal muscle bulk,tone, power .  Medications reviewed:  Scheduled Meds:  Chlorhexidine Gluconate Cloth  6 each Topical Daily   enoxaparin (LOVENOX) injection  40 mg Subcutaneous Q24H   levETIRAcetam  500 mg Oral BID   tamsulosin  0.4 mg Oral Daily   Continuous Infusions:  cefTRIAXone (ROCEPHIN)  IV 2 g (04/02/21 06/02/21)    Intake/Output  Intake/Output Summary (Last 24 hours) at 04/02/2021 1003 Last data filed at 04/01/2021 2028 Gross per 24 hour  Intake 1116.52 ml  Output 1000 ml  Net 116.52 ml   Intake/Output from previous day: 10/03 0701 - 10/04 0700 In: 1116.5 [I.V.:912.4; IV Piggyback:204.1] Out: 1000 [Urine:1000] Net IO Since Admission: 2,677 mL [04/02/21 1003]   Weight change:   Wt Readings from Last 3 Encounters:  03/28/21 74.8 kg  03/25/21 68.9 kg     Consultants: Neurology Cardiology Infectious disease   Procedures: Lp  Antimicrobials: Anti-infectives (From admission, onward)    Start     Dose/Rate Route Frequency Ordered Stop    04/02/21 0500  cefTRIAXone (ROCEPHIN) 2 g in sodium chloride 0.9 % 100 mL IVPB        2 g 200 mL/hr over 30 Minutes Intravenous Every 24 hours 04/01/21 0955     03/30/21 0600  cefTRIAXone (ROCEPHIN) 2 g in sodium chloride 0.9 % 100 mL IVPB  Status:  Discontinued        2 g 200 mL/hr over 30 Minutes Intravenous Every 12 hours 03/29/21 2059 04/01/21 0955   03/30/21 0600  vancomycin (VANCOCIN) IVPB 1000 mg/200 mL premix  Status:  Discontinued        1,000 mg 200 mL/hr over 60 Minutes Intravenous Every 8 hours 03/29/21 2119 03/31/21 1306   03/29/21 2215  vancomycin (VANCOREADY) IVPB 1750 mg/350 mL        1,750 mg 175 mL/hr over 120 Minutes Intravenous  Once 03/29/21 2119 03/30/21 0046   03/29/21 2215  ampicillin (OMNIPEN) 2 g in sodium chloride 0.9 % 100 mL IVPB  Status:  Discontinued        2 g 300 mL/hr over 20 Minutes Intravenous Every 4 hours 03/29/21 2119 03/31/21 1305   03/29/21 1700  cefTRIAXone (ROCEPHIN) 1 g in sodium chloride 0.9 % 100 mL IVPB  Status:  Discontinued        1 g 200 mL/hr over 30 Minutes Intravenous Every 24 hours 03/29/21 0820 03/29/21 2059   03/29/21 1400  acyclovir (ZOVIRAX) 750 mg in dextrose 5 % 150 mL IVPB  Status:  Discontinued        750 mg 165 mL/hr over 60 Minutes Intravenous Every 8 hours 03/29/21 1212 04/02/21 0904      Culture/Microbiology    Component Value Date/Time   SDES CSF 03/29/2021 1453   SPECREQUEST NONE 03/29/2021 1453   CULT  03/29/2021 1453    NO GROWTH 3 DAYS Performed at [facility], [address] [place], [place] [address]    REPTSTATUS 04/02/2021 FINAL 03/29/2021 1453    Other culture-see note  Unresulted Labs (From admission, onward)     Start     Ordered   04/03/21 0500  Basic metabolic panel  Tomorrow morning,   R       Question:  Specimen collection method  Answer:  Lab=Lab collect   04/02/21 0709   04/03/21 0500  CBC  Tomorrow morning,   R       Question:  Specimen collection method  Answer:  Lab=Lab collect    04/02/21 0709   03/30/21 1447  VDRL, CSF  Once,   AD        03/30/21 1447   03/29/21 1452  VZV PCR, CSF  Once,   R        03/29/21 1451   03/29/21 1450  Miscellaneous [facility] test (send-out)  Once,   R       Question:  Test name / description:  Answer:  Meningitis/Encephalitis Panel by PCR to [facility]   03/29/21 1450   03/29/21 1449  Fungus Culture With Stain  Once,   R       Comments: Run on CSF and not on Blood  Question:  Release to patient  Answer:  Immediate   03/29/21 1448   03/29/21 1449  Arbovirus panel [facility], [facility])-perf at [facility] state lab  Once,   R        03/29/21 1449   03/28/21 1447  Urine rapid drug screen (hosp performed)  ONCE - STAT,   STAT        03/28/21 1446            Data Reviewed: I have personally reviewed following labs and imaging studies CBC: Recent Labs  Lab 03/28/21 1444 03/28/21 1525 03/29/21 0438 03/30/21 0942 03/31/21 1018 04/01/21 0330  WBC 10.8*  --  10.1 10.6* 10.7* 12.2*  NEUTROABS 7.8*  --   --  7.6 8.0* 9.9*  HGB 12.9* 11.6* 11.2* 11.8* 13.2 11.9*  HCT 39.2 34.0* 34.8* 37.1* 39.0 35.0*  MCV 92.9  --  94.1 94.2 91.3 90.2  PLT 214  --  222 215 259 271   Basic Metabolic Panel: Recent Labs  Lab 03/28/21 1444 03/28/21 1525 03/28/21 1930 03/29/21 0438 03/30/21 0135 03/31/21 1018 04/01/21 0330  NA 138 140  --  138 137 139 138  K 4.8 4.3  --  3.4* 3.8 4.2 4.1  CL 104 104  --  109 109 107 106  CO2 23  --   --  21* 18* 22 24  GLUCOSE 103* 102*  --  100* 97 120* 136*  BUN 13 12  --  CREATININE 1.27* 1.10  --  1.00 1.10 0.74 0.77  CALCIUM 8.7*  --   --  8.4* 8.0* 8.7* 8.5*  MG  --   --  2.3  --  1.9  --   --    GFR: Estimated Creatinine Clearance: 139 mL/min (by C-G formula based on SCr of 0.77 mg/dL). Liver  Function Tests: Recent Labs  Lab 03/28/21 1444  AST 36  ALT 25  ALKPHOS 39  BILITOT 0.4  PROT 6.9  ALBUMIN 3.6   No results for input(s): LIPASE, AMYLASE in the last 168 hours. No results for input(s):  AMMONIA in the last 168 hours. Coagulation Profile: Recent Labs  Lab 03/28/21 1444  INR 1.1   Cardiac Enzymes: No results for input(s): CKTOTAL, CKMB, CKMBINDEX, TROPONINI in the last 168 hours. BNP (last 3 results) No results for input(s): PROBNP in the last 8760 hours. HbA1C: No results for input(s): HGBA1C in the last 72 hours. CBG: No results for input(s): GLUCAP in the last 168 hours. Lipid Profile: No results for input(s): CHOL, HDL, LDLCALC, TRIG, CHOLHDL, LDLDIRECT in the last 72 hours. Thyroid Function Tests: No results for input(s): TSH, T4TOTAL, FREET4, T3FREE, THYROIDAB in the last 72 hours. Anemia Panel: No results for input(s): VITAMINB12, FOLATE, FERRITIN, TIBC, IRON, RETICCTPCT in the last 72 hours. Sepsis Labs: Recent Labs  Lab 03/28/21 1444 03/28/21 1908  LATICACIDVEN 4.1* 1.0    Recent Results (from the past 240 hour(s))  Culture, blood (Routine X 2) w Reflex to ID Panel     Status: None   Collection Time: 03/25/21  5:04 PM   Specimen: BLOOD  Result Value Ref Range Status   Specimen Description   Final    BLOOD RIGHT ANTECUBITAL Performed at [facility], [address] [place], [place] [address]    Special Requests   Final    BOTTLES DRAWN AEROBIC AND ANAEROBIC Blood Culture adequate volume Performed at [facility], [address] [place], [place] [address]    Culture   Final    NO GROWTH 5 DAYS Performed at [facility], [address], [place], [place] [address]    Report Status 03/30/2021 FINAL  Final  Culture, blood (Routine X 2) w Reflex to ID Panel     Status: None   Collection Time: 03/25/21  5:12 PM   Specimen: BLOOD  Result Value Ref Range Status   Specimen Description   Final    BLOOD LEFT ANTECUBITAL Performed at [facility], [address] [place], [place] [address]    Special Requests   Final    BOTTLES DRAWN AEROBIC AND ANAEROBIC Blood Culture adequate volume Performed at [facility], [address] [place], [place] [address]    Culture   Final    NO GROWTH 5 DAYS Performed at [facility], [address] [place], [place] [address]    Report Status 03/30/2021 FINAL  Final  Urine Culture     Status: None   Collection Time: 03/28/21  2:44 PM   Specimen: In/Out Cath Urine  Result Value Ref Range Status   Specimen Description IN/OUT CATH URINE  Final   Special Requests NONE  Final   Culture   Final    NO GROWTH Performed at [facility], [address], [place], [place] [address]    Report Status 03/29/2021 FINAL  Final  Blood Culture (routine x 2)     Status: None   Collection Time: 03/28/21  2:45 PM   Specimen: BLOOD  Result Value Ref Range Status   Specimen Description BLOOD RIGHT UPPER ARM  Final   Special Requests   Final    BOTTLES DRAWN AEROBIC AND ANAEROBIC Blood Culture results may not be optimal due to an excessive volume of blood received  in culture bottles   Culture   Final    NO GROWTH 5 DAYS Performed at [facility], [address], [place], [place] [address]    Report Status 04/02/2021 FINAL  Final  Blood Culture (routine x 2)     Status: Abnormal   Collection Time: 03/28/21  2:50 PM   Specimen: BLOOD  Result Value Ref Range Status   Specimen Description BLOOD LEFT ANTECUBITAL  Final   Special Requests   Final    BOTTLES DRAWN AEROBIC AND ANAEROBIC Blood Culture results may not be optimal due to an excessive volume of blood received in culture bottles   Culture  Setup Time   Final    GRAM POSITIVE COCCI IN CLUSTERS IN BOTH AEROBIC AND ANAEROBIC BOTTLES CRITICAL RESULT CALLED TO, READ BACK BY AND VERIFIED WITH: PHARMD E.SINCLAIR AT 1255 ON 03/29/2021 BY T.SAAD.    Culture (A)  Final    STAPHYLOCOCCUS EPIDERMIDIS THE SIGNIFICANCE OF ISOLATING THIS ORGANISM FROM A SINGLE SET OF BLOOD CULTURES WHEN MULTIPLE SETS ARE DRAWN IS UNCERTAIN. PLEASE NOTIFY THE MICROBIOLOGY DEPARTMENT WITHIN ONE WEEK IF SPECIATION AND  SENSITIVITIES ARE REQUIRED. Performed at [facility], [address] [place], [place] [address]    Report Status 03/31/2021 FINAL  Final  Blood Culture ID Panel (Reflexed)     Status: Abnormal   Collection Time: 03/28/21  2:50 PM  Result Value Ref Range Status   Enterococcus faecalis NOT DETECTED NOT DETECTED Final   Enterococcus Faecium NOT DETECTED NOT DETECTED Final   Listeria monocytogenes NOT DETECTED NOT DETECTED Final   Staphylococcus species DETECTED (A) NOT DETECTED Final    Comment: CRITICAL RESULT CALLED TO, READ BACK BY AND VERIFIED WITH: PHARMD E.SINCLAIR AT 1255 ON 03/29/2021 BY T.SAAD.    Staphylococcus aureus (BCID) NOT DETECTED NOT DETECTED Final   Staphylococcus epidermidis DETECTED (A) NOT DETECTED Final    Comment: CRITICAL RESULT CALLED TO, READ BACK BY AND VERIFIED WITH: PHARMD E.SINCLAIR AT 1255 ON 03/29/2021 BY T.SAAD.    Staphylococcus lugdunensis NOT DETECTED NOT DETECTED Final   Streptococcus species NOT DETECTED NOT DETECTED Final   Streptococcus agalactiae NOT DETECTED NOT DETECTED Final   Streptococcus pneumoniae NOT DETECTED NOT DETECTED Final   Streptococcus pyogenes NOT DETECTED NOT DETECTED Final   A.calcoaceticus-baumannii NOT DETECTED NOT DETECTED Final   Bacteroides fragilis NOT DETECTED NOT DETECTED Final   Enterobacterales NOT DETECTED NOT DETECTED Final   Enterobacter cloacae complex NOT DETECTED NOT DETECTED Final   Escherichia coli NOT DETECTED NOT DETECTED Final   Klebsiella aerogenes NOT DETECTED NOT DETECTED Final   Klebsiella oxytoca NOT DETECTED NOT DETECTED Final   Klebsiella pneumoniae NOT DETECTED NOT DETECTED Final   Proteus species NOT DETECTED NOT DETECTED Final   Salmonella species NOT DETECTED NOT DETECTED Final   Serratia marcescens NOT DETECTED NOT DETECTED Final   Haemophilus influenzae NOT DETECTED NOT DETECTED Final   Neisseria meningitidis NOT DETECTED NOT DETECTED Final   Pseudomonas aeruginosa NOT DETECTED NOT  DETECTED Final   Stenotrophomonas maltophilia NOT DETECTED NOT DETECTED Final   Candida albicans NOT DETECTED NOT DETECTED Final   Candida auris NOT DETECTED NOT DETECTED Final   Candida glabrata NOT DETECTED NOT DETECTED Final   Candida krusei NOT DETECTED NOT DETECTED Final   Candida parapsilosis NOT DETECTED NOT DETECTED Final   Candida tropicalis NOT DETECTED NOT DETECTED Final   Cryptococcus neoformans/gattii NOT DETECTED NOT DETECTED Final   Methicillin resistance mecA/C NOT DETECTED NOT DETECTED Final  Comment: Performed at [facility], [address] [place], [place] [address]  CSF culture w Stat Gram Stain     Status: None   Collection Time: 03/29/21  2:53 PM   Specimen: CSF; Cerebrospinal Fluid  Result Value Ref Range Status   Specimen Description CSF  Final   Special Requests NONE  Final   Gram Stain   Final    WBC PRESENT,BOTH PMN AND MONONUCLEAR NO ORGANISMS SEEN CYTOSPIN SMEAR    Culture   Final    NO GROWTH 3 DAYS Performed at [facility], [address], [place], [place] [address]    Report Status 04/02/2021 FINAL  Final  Resp Panel by RT-PCR (Flu A&B, Covid) Nasopharyngeal Swab     Status: None   Collection Time: 03/29/21  3:12 PM   Specimen: Nasopharyngeal Swab; Nasopharyngeal(NP) swabs in vial transport medium  Result Value Ref Range Status   SARS Coronavirus 2 by RT PCR NEGATIVE NEGATIVE Final    Comment: (NOTE) SARS-CoV-2 target nucleic acids are NOT DETECTED.  The SARS-CoV-2 RNA is generally detectable in upper respiratory specimens during the acute phase of infection. The lowest concentration of SARS-CoV-2 viral copies this assay can detect is 138 copies/mL. A negative result does not preclude SARS-Cov-2 infection and should not be used as the sole basis for treatment or other patient management decisions. A negative result may occur with  improper specimen collection/handling, submission of specimen other than nasopharyngeal swab,  presence of viral mutation(s) within the areas targeted by this assay, and inadequate number of viral copies(<138 copies/mL). A negative result must be combined with clinical observations, patient history, and epidemiological information. The expected result is Negative.  Fact Sheet for Patients:  BloggerCourse.com  Fact Sheet for Healthcare Providers:  SeriousBroker.it  This test is no t yet approved or cleared by the [place] FDA and  has been authorized for detection and/or diagnosis of SARS-CoV-2 by FDA under an Emergency Use Authorization (EUA). This EUA will remain  in effect (meaning this test can be used) for the duration of the COVID-19 declaration under Section 564(b)(1) of the Act, 21 U.S.C.section 360bbb-3(b)(1), unless the authorization is terminated  or revoked sooner.       Influenza A by PCR NEGATIVE NEGATIVE Final   Influenza B by PCR NEGATIVE NEGATIVE Final    Comment: (NOTE) The Xpert Xpress SARS-CoV-2/FLU/RSV plus assay is intended as an aid in the diagnosis of influenza from Nasopharyngeal swab specimens and should not be used as a sole basis for treatment. Nasal washings and aspirates are unacceptable for Xpert Xpress SARS-CoV-2/FLU/RSV testing.  Fact Sheet for Patients: BloggerCourse.com  Fact Sheet for Healthcare Providers: SeriousBroker.it  This test is not yet approved or cleared by the [place] FDA and has been authorized for detection and/or diagnosis of SARS-CoV-2 by FDA under an Emergency Use Authorization (EUA). This EUA will remain in effect (meaning this test can be used) for the duration of the COVID-19 declaration under Section 564(b)(1) of the Act, 21 U.S.C. section 360bbb-3(b)(1), unless the authorization is terminated or revoked.  Performed at [facility], [address], [place], [place] [address]      Radiology  Studies: DG CHEST PORT 1 VIEW  Result Date: 04/01/2021 CLINICAL DATA:  Left-sided chest pain EXAM: PORTABLE CHEST 1 VIEW COMPARISON:  03/28/2021 FINDINGS: The heart size and mediastinal contours are within normal limits. No focal airspace consolidation, pleural effusion, or pneumothorax. The visualized skeletal structures are unremarkable. IMPRESSION: No  active disease. Electronically Signed   By: Duanne Guess D.O.   On: 04/01/2021 20:14     LOS: 4 days   Lanae Boast, MD [facility]  04/02/2021, 10:03 AM

## 2021-04-02 NOTE — Plan of Care (Signed)

## 2021-04-02 NOTE — Progress Notes (Signed)
    [facility]    Date of Admission:  03/28/2021   Total days of antibiotics 5                  ID: James Obrien is a 35 y.o. male admitted with tonic-clonic seizures and groin cellulitis secondary to recent diagnosis of gonorrhea. MRI brain significant for T2 hyperintense lesions consistent with inflammatory vs demyelinating process. Patient also had MRI of thoracic/lumbar spine and showed longitudinally extensive cord signal abnormality/myelitis.   Principal Problem:   Seizure (HCC) Active Problems:   Gonorrhea   Cellulitis   Bradycardia   Subjective: Patient reports no movement of his lower extremities still. He reports that his groin rash is mostly resolved now. Upper extremity ROM is improved. Remains afebrile.  Medications:   Chlorhexidine Gluconate Cloth  6 each Topical Daily   enoxaparin (LOVENOX) injection  40 mg Subcutaneous Q24H   levETIRAcetam  500 mg Oral BID   tamsulosin  0.4 mg Oral Daily    Objective: Vital signs in last 24 hours: Temp:  [97.5 F (36.4 C)-98.4 F (36.9 C)] 98.4 F (36.9 C) (10/04 1345) Pulse Rate:  [45-95] 55 (10/04 1345) Resp:  [17-18] 18 (10/04 1345) BP: (100-119)/(57-69) 107/67 (10/04 1345) SpO2:  [96 %-100 %] 98 % (10/04 1345)  General: No acute distress. CV: RRR. No murmurs, rubs, or gallops. No LE edema Pulmonary: Lungs CTAB. Normal effort. No wheezing or rales. Abdominal: Soft, nontender, nondistended. Normal bowel sounds. Extremities: Palpable radial and DP pulses. No movement of bilateral lower extremities. ROM of upper extremities is intact and strength 5/5 in bilateral upper extremities Skin: Warm and dry. No obvious rash or lesions. Neuro: A&Ox3. Normal sensation.     Lab Results Recent Labs    03/31/21 1018 04/01/21 0330  WBC 10.7* 12.2*  HGB 13.2 11.9*  HCT 39.0 35.0*  NA 139 138  K 4.2 4.1  CL 107 106  CO2 22 24  BUN 8 10  CREATININE 0.74 0.77   Liver Panel No results for  input(s): PROT, ALBUMIN, AST, ALT, ALKPHOS, BILITOT, BILIDIR, IBILI in the last 72 hours. Sedimentation Rate No results for input(s): ESRSEDRATE in the last 72 hours. C-Reactive Protein No results for input(s): CRP in the last 72 hours.  Microbiology:  Studies/Results: DG CHEST PORT 1 VIEW  Result Date: 04/01/2021 CLINICAL DATA:  Left-sided chest pain EXAM: PORTABLE CHEST 1 VIEW COMPARISON:  03/28/2021 FINDINGS: The heart size and mediastinal contours are within normal limits. No focal airspace consolidation, pleural effusion, or pneumothorax. The visualized skeletal structures are unremarkable. IMPRESSION: No active disease. Electronically Signed   By: Duanne Guess D.O.   On: 04/01/2021 20:14     Assessment/Plan: #Encephalitis/myelitis #Paraplegia Upper extremity ROM is improved, although patient is still unable to move his bilateral lower extremities. Etiology unclear to date, although ddx includes West Nile encephalitis vs inflammatory encephalomyelitis. - Continue steroids for possible benefit for reduction of inflammatory response - Likely will benefit from CIR on discharge  #Cellulitis of groin, improving - Continue ceftriaxone    Dillian Feig N [facility] Pager: [phone]  04/02/2021, 4:03 PM

## 2021-04-02 NOTE — Progress Notes (Addendum)
Neurology Progress Note  Brief HPI: 35 y.o. male with a PMHx of genitourinary lesions, groin erythema, and tobacco use who presented with multiple seizures described initially as left hand pins-and-needles sensation, left hand shaking, left eye twitching, progressing into GTC seizure lasting 2-5 minutes s/p Versed.  Patient was febrile at 103.1 F on 9/30 and was started on empiric acyclovir pending HSV PCR.  Major events / Interval History:  9/30: ID consulted, LP complete (WBC 104, RBC 11, Protein 109, Glu 55, clear), EEG with cortical dysfunction of left hemisphere with mild diffuse encephalopathy but without seizures or epileptiform discharges. Arboviral and meningitis/encephalitis panels sent. HIV and RPR negative. Blood cultures + for Methicillin sensitive Staphylococcus epidermidis on ceftriaxone. Acyclovir started empirically for HSV, pending HSV PCR. 10/1: Progressive lower extremity weakness.  Spinal imaging significant for extensive myelitis.  CSF pleocytosis and elevated neutrophil count. Solu-Medrol therapy 250 mg initiated to minimize secondary injury from inflammation. 10/2: Complete paraplegia of BLE 10/3: Afebrile for 24 hours, WBC 12.2 (likely 2/2 steroid initiation) 10/4: HSV PCR negative, deescalate acyclovir.   Subjective: No acute overnight events noted While working with PT yesterday, patient complained of dizziness with sitting up and leaning forward with decreased activity tolerance and symptoms of orthostatic hypotension but he was unable to tolerate position for vital sign measurement. He also had reported decreased sitting balance, recommended for CIR.   Exam: Vitals:   04/02/21 0014 04/02/21 0518  BP: 100/68 109/69  Pulse: 60 (!) 45  Resp: 17 18  Temp: (!) 97.5 F (36.4 C) 97.9 F (36.6 C)  SpO2: 96% 98%   Gen: Laying comfortably in bed, sleeping initially, in no acute distress Resp: non-labored breathing, no respiratory distress on room air. SpO2 98% on  monitor Cardiac: extremities warm, 1+ pedal edema, bradycardia into the 40's on cardiac monitor Abd: soft, non-tender, non-distended  Neuro: Mental Status: Asleep initially, wakes easily to voice.  Alert and oriented x 4, follows commands, he is able to provide a clear and coherent history of present illness.  Speech is intact without dysarthria. No aphasia or neglect noted.  Cranial Nerves:PERRL, EOMI with saccadic pursuits, mild left eye ptosis noted, facial sensation is intact and symmetric to light touch, face is symmetric resting and with movement, hearing is intact to voice, phonation intact, palate elevates symmetrically, tongue protrudes midline. Motor: BUE are without movement, proprioception is not intact.  Right upper extremity 5/5 strength with reports of improvement in left upper extremity weakness today.  LUE with some minimal subjective improvement in strength today with stable strength examination 4+/5 deltoid, 5/5 biceps, triceps, and 4+ wrist extension, 4- wrist flexion, 2/5 finger extension, 4/5 finger flexion.  Sensory: Reports sensory examination is stable with ongoing decreased sensation to touch with tingling with LLE sensation worse than RLE sensation. Sensation to cool temperature is absent bilaterally. Decreased sensation reported from toes to mid abdomen. Proprioception is not intact in bilateral lower extremities. DTR: Unable to elicit reflexes in bilateral patellae or achilles. 3+ and symmetric biceps and brachioradialis.  Gait: Deferred for patient safety due to paraplegia  Pertinent Labs: CBC    Component Value Date/Time   WBC 12.2 (H) 04/01/2021 0330   RBC 3.88 (L) 04/01/2021 0330   HGB 11.9 (L) 04/01/2021 0330   HCT 35.0 (L) 04/01/2021 0330   PLT 271 04/01/2021 0330   MCV 90.2 04/01/2021 0330   MCH 30.7 04/01/2021 0330   MCHC 34.0 04/01/2021 0330   RDW 13.2 04/01/2021 0330   LYMPHSABS 1.5 04/01/2021  0330   MONOABS 0.8 04/01/2021 0330   EOSABS 0.0  04/01/2021 0330   BASOSABS 0.0 04/01/2021 0330   CMP     Component Value Date/Time   NA 138 04/01/2021 0330   K 4.1 04/01/2021 0330   CL 106 04/01/2021 0330   CO2 24 04/01/2021 0330   GLUCOSE 136 (H) 04/01/2021 0330   BUN 10 04/01/2021 0330   CREATININE 0.77 04/01/2021 0330   CALCIUM 8.5 (L) 04/01/2021 0330   PROT 6.9 03/28/2021 1444   ALBUMIN 3.6 03/28/2021 1444   AST 36 03/28/2021 1444   ALT 25 03/28/2021 1444   ALKPHOS 39 03/28/2021 1444   BILITOT 0.4 03/28/2021 1444   GFRNONAA >60 04/01/2021 0330   Tube #   3   Color, CSF COLORLESS COLORLESS   Appearance, CSF CLEAR CLEAR Abnormal    Supernatant   NOT INDICATED   RBC Count, CSF 0 /cu mm 11 High    WBC, CSF 0 - 5 /cu mm 104 High Panic    Comment: CRITICAL RESULT CALLED TO, READ BACK BY AND VERIFIED WITH:   Nathaniel Man, RN, 1804, 03/29/21, E. ADEDOKUN.   Segmented Neutrophils-CSF 0 - 6 % 21 High    Lymphs, CSF 40 - 80 % 64   Monocyte-Macrophage-Spinal Fluid 15 - 45 % 13 Low    Eosinophils, CSF 0 - 1 % 2 High     ACE- within normal limits HIV- negative HSV- negative Pathologist smear review- leukocytosis RPR- Non Reactive Hepatitis A, B, C IgM- Non Reactive Cryptococcal Ag- Negative  Special Requests NONE   Gram Stain WBC PRESENT,BOTH PMN AND MONONUCLEAR  NO ORGANISMS SEEN  CYTOSPIN SMEAR   Culture NO GROWTH 3 DAYS    Unresulted Labs (From admission, onward)     Start     Ordered   04/03/21 0500  Basic metabolic panel  Tomorrow morning,   R       Question:  Specimen collection method  Answer:  Lab=Lab collect   04/02/21 0709   04/03/21 0500  CBC  Tomorrow morning,   R       Question:  Specimen collection method  Answer:  Lab=Lab collect   04/02/21 0709   03/30/21 1447  VDRL, CSF  Once,   AD        03/30/21 1447   03/29/21 1452  VZV PCR, CSF  Once,   R        03/29/21 1451   03/29/21 1450  Miscellaneous [facility] test (send-out)  Once,   R       Question:  Test name / description:  Answer:   Meningitis/Encephalitis Panel by PCR to [facility]   03/29/21 1450   03/29/21 1449  Fungus Culture With Stain  Once,   R       Comments: Run on CSF and not on Blood  Question:  Release to patient  Answer:  Immediate   03/29/21 1448   03/29/21 1449  Arbovirus panel (West Nile, etc)-perf at [facility] state lab  Once,   R        03/29/21 1449   03/28/21 1447  Urine rapid drug screen (hosp performed)  ONCE - STAT,   STAT        03/28/21 1446           Prior Imaging Reviewed:   MRI brain with contrast 03/29/2021: Faint enhancement is associated with the T2 hyperintense lesions, an inflammatory/demyelinating process is again favored. Given pontine involvement and chart history of genitourinary  lesions, specifically consider Bechet's. Further discussion above.   MRI brain wo contrast 03/28/2021: Multifocal abnormal hyperintense T2-weighted signal within both hemispheres, but greatest at the right pre and postcentral gyri. The appearance is concerning for acute demyelination. Lymphoma is a secondary possibility. Postcontrast imaging is recommended as an adjunct to this study, for further evaluation.   MRI thoracic and lumbar spine wo contrast 03/30/2021: 1. Longitudinally extensive cord signal abnormality/myelitis. 2. No hematoma or other complication related to lumbar puncture  Assessment: 35 y.o. male with PMHx significant tobacco use who presented with multiple focal seizures with retained awareness and 2 focal seizures with secondary generalization. His LP showed evidence of pleocytosis and CBC with elevated WBCC. Acyclovir was started empirically for possible encephalitis and discontinued 10/4 with negative HSV PCR. ID saw and bacterial antibiotics were added after neutrophilic CSF was seen in addition to blood culture results + for Methicillin sensitive Staphylococcus epidermidis. DDx includes infectious encephalomyelitis such as West Nile versus less likely inflammatory encephalomyelitis. Pending [employer]  PCR from [facility] as well as cultures.   Impression:  -paraplegia, secondary to longitudinal myelitis.   -encephalitis, cleared.   Recommendations: - Continue to monitor fever curve, patient has been afebrile for 48 hours  - Leukocytosis, likely reactive to steroids, trend - Solu-Medrol therapy day 3/3 completed yesterday 04/01/2021 - Follow up on pending labs, as above. - HSV PCR negative, can deescalate acyclovir therapy - Appreciate ID following, ceftriaxone per their recommendations - Continue seizure precautions - Ativan 2mg  PRN seizure lasting > 3 minutes and call neurology.  - PT/OT, currently being evaluated for CIR placement - Will need close outpatient follow up  - Neurology will continue to follow, if he remains stable on examination on 10/5 we will plan to sign off  , AGACNP-BC [facility] [phone]  Attending Neurologist's note:  I personally saw this patient who was resting comfortably at the time of my evaluation later in the evening, and formulated the assessment and plan, adding the note above for completeness and clarity to accurately reflect my thoughts  [phone] MD-PhD [facility] [phone]  Available 7 AM to 7 PM, outside these hours please contact Neurologist on call listed on AMION

## 2021-04-02 NOTE — Progress Notes (Signed)
Orthopedic Tech Progress Note Patient Details:  James Obrien [facility] 1986-02-23 [id number]  Ortho Devices Type of Ortho Device: Prafo boot/shoe Ortho Device/Splint Interventions: Ordered, Application, Adjustment   Post Interventions Patient Tolerated: Well Instructions Provided: Care of device, Poper ambulation with device  [name] L Kehaulani Fruin 04/02/2021, 9:41 PM

## 2021-04-02 NOTE — Progress Notes (Signed)
Inpatient Rehab Admissions Coordinator:   I met with Pt.'s wife at bedside to discuss potential CIR admit. She stated that she is interested and that Pt  will have 24/7 support at d/c. I would like to see Pt. Participating work up with therapy and that work up is complete before [employer] treasurer for [employer] but I will follow for potential admission pending medical readiness, insurance auth, and bed availability.   Clemens Catholic, [place], [place] Admissions Coordinator  [phone] ([place]) [phone] (office)

## 2021-04-02 NOTE — Evaluation (Signed)
Occupational Therapy Evaluation Patient Details Name: James Obrien MRN: [id number] DOB: 08-14-1985 Today's Date: 04/02/2021   History of Present Illness 35 y.o. male with a PMHx of genitourinary lesions, groin erythema, and tobacco use who presented with multiple seizures described initially as left hand pins-and-needles sensation, left hand shaking, left eye twitching, progressing into GTC seizure lasting 2-5 minutes s/p Versed.  Patient was febrile at 103.1 F on 9/30 and was started on empiric acyclovir pending HSV PCR.  MRI positive for extensive myelitis.   Clinical Impression   James Obrien was indep prior to the above admission, he lives in a mobile home with his spouse and 3 children. Upon evaluation pt required mod-max A +2 for all bed mobility and sitting EOB balance. He had brief intermittent periods of close min guard sitting balance, the longest trial being ~20 seconds. Pt has impaired fine and gross motor skills of LUE, as well as diminished sensation most apparent along the ulnar nerve distribution. Pt is land hand dominant, however is able to use RUE for compensatory strategies. Pt may benefit from built up handles to increase indep with grooming and self feeding. He would benefit from continued OT acutely. Recommend d/c to CIR.      Recommendations for follow up therapy are one component of a multi-disciplinary discharge planning process, led by the attending physician.  Recommendations may be updated based on patient status, additional functional criteria and insurance authorization.   Follow Up Recommendations  CIR    Equipment Recommendations   (defer to next venue)    Recommendations for Other Services Rehab consult     Precautions / Restrictions Precautions Precautions: Fall Precaution Comments: paraparesis; check BP Restrictions Weight Bearing Restrictions: No      Mobility Bed Mobility Overal bed mobility: Needs Assistance Bed Mobility: Rolling;Sidelying to  Sit;Sit to Supine;Supine to Sit Rolling: Mod assist Sidelying to sit: Max assist   Sit to supine: Max assist;+2 for safety/equipment;+2 for physical assistance   General bed mobility comments: verbal cues throughout for positioning and sequencing, pt able to reach across with RUE to pull himself into sidelying, required max a for management of BLE, and to push trunk into sitting    Transfers Overall transfer level: Needs assistance               General transfer comment: not attempted this session - safety    Balance Overall balance assessment: Needs assistance Sitting-balance support: Bilateral upper extremity supported Sitting balance-Leahy Scale: Poor Sitting balance - Comments: mod-max support given for sitting balance, brief moments of clsoe min guard sitting balance                                   ADL either performed or assessed with clinical judgement   ADL Overall ADL's : Needs assistance/impaired Eating/Feeding: Set up Eating/Feeding Details (indicate cue type and reason): assistance with packaging - pt is LHD, but able to self feed with R hand. May benefit from built up handle Grooming: Set up;Sitting   Upper Body Bathing: Maximal assistance;Sitting Upper Body Bathing Details (indicate cue type and reason): assist for sitting balance and bathing Lower Body Bathing: Maximal assistance;Sitting/lateral leans;+2 for physical assistance;+2 for safety/equipment   Upper Body Dressing : Maximal assistance;Sitting   Lower Body Dressing: Total assistance;Sitting/lateral leans;+2 for physical assistance;+2 for safety/equipment   Toilet Transfer: Total assistance;+2 for physical assistance;+2 for safety/equipment   Toileting- Clothing Manipulation and Hygiene: Maximal  assistance;Sitting/lateral lean       Functional mobility during ADLs: +2 for physical assistance;+2 for safety/equipment General ADL Comments: assist for balance, paraparesis, impaired  function of LUE and BLE     Vision Baseline Vision/History: 0 No visual deficits Patient Visual Report: No change from baseline       Perception     Praxis      Pertinent Vitals/Pain Pain Assessment: Faces Faces Pain Scale: Hurts a little bit Pain Location: generalized with movement - main complaint of dizziness Pain Descriptors / Indicators: Grimacing Pain Intervention(s): Limited activity within patient's tolerance;Monitored during session     Hand Dominance Left   Extremity/Trunk Assessment Upper Extremity Assessment RUE Deficits / Details: AROM WFL, strength 5/5 throughout LUE Deficits / Details: AROM WFL, strength 3/5 grip, 3+/5 wrist flex/ext, 4-/5 elbow flexion, 3+/5 elbow extension, 4/5 shoulder flexion LUE Sensation: decreased light touch LUE Coordination: decreased fine motor;decreased gross motor   Lower Extremity Assessment Lower Extremity Assessment: Defer to PT evaluation   Cervical / Trunk Assessment Cervical / Trunk Assessment: Normal   Communication Communication Communication: No difficulties   Cognition Arousal/Alertness: Awake/alert Behavior During Therapy: WFL for tasks assessed/performed;Flat affect Overall Cognitive Status: Within Functional Limits for tasks assessed                                     General Comments  BP stable throughout, pt reported dizziness throughout and some associated nausea    Exercises     Shoulder Instructions      Home Living Family/patient expects to be discharged to:: Private residence Living Arrangements: Spouse/significant other Available Help at Discharge: Family;Available 24 hours/day Type of Home: Mobile home Home Access: Stairs to enter Entrance Stairs-Number of Steps: 3   Home Layout: One level     Bathroom Shower/Tub: Chief Strategy Officer: Standard     Home Equipment: None   Additional Comments: 3 children (12/8/5 and wife homeschools)      Prior  Functioning/Environment Level of Independence: Independent        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Garment/textile technologist Problem List: Decreased strength;Decreased range of motion;Decreased activity tolerance;Impaired balance (sitting and/or standing);Decreased coordination;Decreased knowledge of use of DME or AE;Decreased knowledge of precautions;Impaired sensation;Impaired UE functional use;Pain      OT Treatment/Interventions: Self-care/ADL training;Therapeutic exercise;DME and/or AE instruction;Therapeutic activities;Patient/family education;Balance training    OT Goals(Current goals can be found in the care plan section) Acute Rehab OT Goals Patient Stated Goal: less dizziness OT Goal Formulation: With patient Time For Goal Achievement: 04/16/21 Potential to Achieve Goals: Fair ADL Goals Pt Will Perform Grooming: with modified independence;sitting Pt Will Perform Upper Body Bathing: with min assist;sitting Pt Will Perform Lower Body Bathing: with mod assist;sitting/lateral leans Pt Will Perform Upper Body Dressing: with min assist;sitting Pt Will Perform Lower Body Dressing: with mod assist;sitting/lateral leans Pt Will Transfer to Toilet: anterior/posterior transfer;with mod assist Additional ADL Goal #1: pt will maintain unsupported sitting balance for at least 5 minutes in preparation for sitting ADLs  OT Frequency: Min 2X/week   Barriers to D/C:            Co-evaluation              AM-PAC OT "6 Clicks" Daily Activity     Outcome Measure Help from another person eating meals?: A Little Help from another person taking care of personal  grooming?: A Little Help from another person toileting, which includes using toliet, bedpan, or urinal?: Total Help from another person bathing (including washing, rinsing, drying)?: A Lot Help from another person to put on and taking off regular upper body clothing?: A Lot Help from another person to put on and taking off regular lower body  clothing?: Total 6 Click Score: 12   End of Session Equipment Utilized During Treatment:  (BLE wraps) Nurse Communication: Mobility status  Activity Tolerance: Patient tolerated treatment well Patient left: with call bell/phone within reach;with bed alarm set;in bed;with family/visitor present  OT Visit Diagnosis: Unsteadiness on feet (R26.81);Other abnormalities of gait and mobility (R26.89);Muscle weakness (generalized) (M62.81);Pain                Time: 1026-1100 OT Time Calculation (min): 34 min Charges:  OT General Charges $OT Visit: 1 Visit OT Evaluation $OT Eval Moderate Complexity: 1 Mod   Erian Rosengren A Jeramia Saleeby 04/02/2021, 12:22 PM

## 2021-04-02 NOTE — TOC Progression Note (Signed)
Transition of Care [facility]) - Progression Note    Patient Details  Name: James Obrien MRN: [id number] Date of Birth: 1985-11-08  Transition of Care [facility]) CM/SW Contact  Pollie Friar, RN Phone Number: 04/02/2021, 2:02 PM  Clinical Narrative:    Recommendations are for CIR. Pt and wife live in [place], [place]. [place] met with the patient and wife. (Pt slept through visit). Wife is interested in a rehab in the [place] area as this is closer to them. She also is open to our inpatient rehab if it will have an opening. CM has faxed pt's information to [place] inpatient rehab and left a voicemail for a return call. TOC following.   Expected Discharge Plan: IP Rehab Facility Barriers to Discharge: Continued Medical Work up  Expected Discharge Plan and Services Expected Discharge Plan: [place]   Discharge Planning Services: CM Consult   Living arrangements for the past 2 months: Mobile Home                                       Social Determinants of Health (SDOH) Interventions    Readmission Risk Interventions No flowsheet data found.

## 2021-04-02 NOTE — PMR Pre-admission (Signed)
PMR Admission Coordinator Pre-Admission Assessment  Patient: James Obrien is an 35 y.o., male MRN: [id number] DOB: 02/12/1986 Height: 5' 11" (180.3 cm) Weight: 74.8 kg  Insurance Information HMO:     PPO:      PCP:      IPA:      80/20: Yes     OTHER: Open access Plus PRIMARY: [employer] Generic (Ally)      Policy#: [id number]      Subscriber: patient CM Name: Lisa      Phone#: [phone] EXT [phone]     Fax#: clinicals given verbally Pre-Cert#: [id number] approved until 10/14 with fax [phone]   Employer: [employer] Benefits:  Phone #: [phone]     Name: Kelsey Eff. Date: 09/28/20     Deduct: $750 (met $0)      Out of Pocket Max: $3000 (met $0)      Life Max: N/A CIR: 80%      SNF: 80% Outpatient: 80%     Co-Pay: 20% Home Health: 80%      Co-Pay: 20% DME: 80%     Co-Pay: 20% Providers: in network  SECONDARY:  none         The "Data Collection Information Summary" for patients in Inpatient Rehabilitation Facilities with attached "Privacy Act Statement-Health Care Records" was provided and verbally reviewed with: n/a  Emergency Contact Information Contact Information     Name Relation Home Work Mobile   [name],[name] Spouse   [phone]   Giodlin,Seth Friend   [phone]   Bright,Dakota Friend [phone]         Current Medical History  Patient Admitting Diagnosis: Longitudinal Myelitis and Paraplegia  History of Present Illness:   35-year-old right-handed male with unremarkable past medical history except for recent penile and scrotal lesions thought to be due to a spider bite as well as tobacco use on no prescription medications.    Presented 03/28/2021 with tingling and numbness of the left hand which quickly progressed to the lower extremities as well as a witnessed generalized tonic-clonic seizure that lasted 2-5 minutes.  Patient was loaded with Keppra.  EEG showed no seizure.  Cranial CT scan negative.  MRI showed multifocal abnormal hyperintense T2 weighted signal  within both hemispheres but greatest at the right pre and postcentral gyri.  Appearance was concerning for acute demyelination.  MRI thoracic lumbar spine longitudinally extensive cord signal abnormality/myelitis.  No hematoma or other complications.  CT of the chest abdomen pelvis showed subcutaneous fat stranding left lower quadrant anterior abdominal wall and left inguinal region consistent with cellulitis.  No fluid collection or abscess.  Reactive lymphadenopathy within the pelvis and bilateral inguinal regions.  Admission chemistries unremarkable except creatinine 1.27, urine cultures no growth, blood cultures no growth to date, lactic acid 4.1, HIV antibody nonreactive, RPR nonreactive, GC/chlamydia probe positive for gonorrhea..  Neurology consulted lumbar puncture showed evidence of pleocytosis and CBC with elevated WBCC.  Consistent with myelitis, encephalitis.  Patient was initially placed on empiric acyclovir for possible encephalitis discontinued 10/4 with negative HSV.  ID did see the patient and bacterial antibiotics were added after neutrophilic CSF was seen in addition to blood cultures positive for methicillin sensitive Staphylococcus epidermidis.  He was treated with a lower dose of steroids to help reduce potential spinal cord swelling.  Patient's antibiotic course has been completed.  Hospital course patient did have some bradycardia with cardiology follow-up and echocardiogram completed with ejection fraction of 55 to 60% no wall motion abnormality and no further work-up   was indicated.  Patient bouts of urinary retention and failed voiding trial with Foley tube in place and Flomax initiated.  Lovenox added for DVT prophylaxis  Patient's medical record from [facility] has been reviewed by the rehabilitation admission coordinator and physician.  Past Medical History  Past Medical History:  Diagnosis Date   Medical history non-contributory    Has the patient had major  surgery during 100 days prior to admission? Yes  Family History   family history is not on file.  Current Medications  Current Facility-Administered Medications:    acetaminophen (TYLENOL) tablet 650 mg, 650 mg, Oral, Q6H PRN, 650 mg at 04/05/21 0820 **OR** acetaminophen (TYLENOL) suppository 650 mg, 650 mg, Rectal, Q6H PRN, Orma Flaming, MD   Chlorhexidine Gluconate Cloth 2 % PADS 6 each, 6 each, Topical, Daily, Kc, Ramesh, MD, 6 each at 04/05/21 1203   enoxaparin (LOVENOX) injection 40 mg, 40 mg, Subcutaneous, Q24H, Cristal Deer, MD, 40 mg at 04/04/21 2104   ibuprofen (ADVIL) tablet 600 mg, 600 mg, Oral, Q6H PRN, Chotiner, Yevonne Aline, MD, 600 mg at 03/29/21 0145   levETIRAcetam (KEPPRA) tablet 500 mg, 500 mg, Oral, BID, Donnetta Simpers, MD, 500 mg at 04/05/21 0941   LORazepam (ATIVAN) injection 1 mg, 1 mg, Intravenous, Q4H PRN, Suella Broad A, PA-C   polyethylene glycol (MIRALAX / GLYCOLAX) packet 17 g, 17 g, Oral, BID, Charlynne Cousins, MD   tamsulosin [facility]) capsule 0.4 mg, 0.4 mg, Oral, Daily, Kc, Ramesh, MD, 0.4 mg at 04/05/21 0941  Patients Current Diet:  Diet Order             Diet - low sodium heart healthy           Diet regular Room service appropriate? Yes; Fluid consistency: Thin  Diet effective now                  Precautions / Restrictions Precautions Precautions: Fall Precaution Comments: paraparesis; check BP Other Brace: ace wraps for LE's Restrictions Weight Bearing Restrictions: No   Has the patient had 2 or more falls or a fall with injury in the past year? Yes  Prior Activity Level Community (5-7x/wk): He worked and drove pta;  Prior Functional Level Self Care: Did the patient need help bathing, dressing, using the toilet or eating? Independent  Indoor Mobility: Did the patient need assistance with walking from room to room (with or without device)? Independent  Stairs: Did the patient need assistance with internal or external  stairs (with or without device)? Independent  Functional Cognition: Did the patient need help planning regular tasks such as shopping or remembering to take medications? Independent  Patient Information Are you of Hispanic, Latino/a,or Spanish origin?: A. No, not of Hispanic, Latino/a, or Spanish origin What is your race?: A. White Do you need or want an interpreter to communicate with a doctor or health care staff?: 0. 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 None of the above  Current Functional Level Cognition  Overall Cognitive Status: Within Functional Limits for tasks assessed Orientation Level: Oriented X4 General Comments: Pt at times slow processing with word choice when seated EOB, although pt admits dizziness which may be distracting. Pt calm/cooperative.    Extremity Assessment (includes Sensation/Coordination)  Upper Extremity Assessment: RUE deficits/detail, LUE deficits/detail RUE Deficits / Details: AROM WFL, strength 5/5 throughout LUE Deficits / Details: AROM WFL, strength 3/5 grip, 3+/5 wrist flex/ext, 4-/5 elbow flexion, 3+/5 elbow extension, 4/5 shoulder flexion LUE Sensation: decreased light touch LUE Coordination: decreased fine motor, decreased gross motor  Lower Extremity Assessment:  Defer to PT evaluation RLE Deficits / Details: PROM WFL, strength noted some reflexive movement in toes with sensory testing, some push toward hip extension versus knee extension with quick stretch leg in full flexion, but could not extend even to half the range, othewise pt unable to elicit movement RLE Sensation: decreased light touch (reports abnormal sensation below the waist) LLE Deficits / Details: PROM WFL, strength noted some reflexive movement in toes with sensory testing, some push toward hip extension versus knee extension with quick stretch leg in full flexion, but could not extend as much as R, othewise pt unable to elicit movement LLE Sensation: decreased light touch (reports abnormal sensation below the waist)    ADLs  Overall ADL's : Needs assistance/impaired Eating/Feeding: Set up Eating/Feeding Details (indicate cue type and reason): assistance with packaging - pt is LHD, but able to self feed with R hand. May benefit from built up handle Grooming: Set up, Sitting Upper Body Bathing: Maximal assistance, Sitting Upper Body Bathing Details (indicate cue type and reason): assist for sitting balance and bathing Lower Body Bathing: Maximal assistance, Sitting/lateral leans, +2 for physical assistance, +2 for safety/equipment Upper Body Dressing : Maximal assistance, Sitting Lower Body Dressing: Total assistance, Sitting/lateral leans, +2 for physical assistance, +2 for safety/equipment Toilet Transfer: Total assistance, +2 for physical assistance, +2 for safety/equipment Toileting- Clothing Manipulation and Hygiene: Maximal assistance, Sitting/lateral lean Functional mobility during ADLs: +2 for physical assistance, +2 for safety/equipment General ADL Comments: assist for balance, paraparesis, impaired function of LUE and BLE    Mobility  Overal bed mobility: Needs Assistance Bed Mobility: Rolling, Sidelying to Sit Rolling: Mod assist Sidelying to sit: Mod assist, HOB elevated,  +2 for safety/equipment Supine to sit: Max assist, HOB elevated Sit to supine: Max assist, +2 for safety/equipment, +2 for physical assistance General bed mobility comments: pt requires assistance for movement of LEs, able to utilize UE strength to elevate trunk into sitting. Pt with posterior loss of balance during initial attempt due to core weakness so second person guarding for safety    Transfers  Overall transfer level: Needs assistance Equipment used: None Transfers: Lateral/Scoot Transfers, Sit to/from Stand Sit to Stand: Total assist Stand pivot transfers: Total assist  Lateral/Scoot Transfers: Max assist, +2 safety/equipment General transfer comment: Cues for anterior trunk lean and hand placement. PT utilizing bed pad to maintain anterior trunk lean and prevent posterior loss of balance due to impaired core strength, +2 for safety this session and totalA for BLE positioning while scooting. Pt performs partial sit to stand with PT knee block and BUE support of PT, aiding repositioning of bed pad.    Ambulation / Gait / Stairs / Wheelchair Mobility       Posture / Balance Dynamic Sitting Balance Sitting balance -   Comments: pt reliant on BUE support of bed, multiple rightward and posterior losses of balance at edge of bed with removal of UE support Balance Overall balance assessment: Needs assistance Sitting-balance support: Bilateral upper extremity supported Sitting balance-Leahy Scale: Poor Sitting balance - Comments: pt reliant on BUE support of bed, multiple rightward and posterior losses of balance at edge of bed with removal of UE support Postural control: Posterior lean, Right lateral lean Standing balance support: Bilateral upper extremity supported Standing balance-Leahy Scale: Zero Standing balance comment: requires knee block and totalA for partial stand x2 reps for pad repositioning    Special needs/care consideration 10/6 non latex 14 fr catheter placed On 10/ 6  patient without BM for 8 days. Acute has given him an enema without results Seizure Precautions   Previous Home Environment  Living Arrangements: Spouse/significant other (11 12 and 5 year old children in the home)  Lives With: Spouse, Family Available Help at Discharge:  (His MOm is retired and will be asissting with his care as well as his wife) Type of Home: Other(Comment) Home Layout: Other (Comment) (30 foot camper) Home Access: Stairs to enter Entrance Stairs-Rails: None Entrance Stairs-Number of Steps: 3 Bathroom Shower/Tub: Tub/shower unit Bathroom Toilet: Standard Bathroom Accessibility: No Home Care Services: No Additional Comments: 3 children 12, 11 and 5 and wife homeschools  Discharge Living Setting Plans for Discharge Living Setting: Lives with (comment) (to go stay at his Mom's home) Type of Home at Discharge: House Discharge Home Layout: One level Discharge Home Access: Stairs to enter Entrance Stairs-Rails: Right, Left Entrance Stairs-Number of Steps: 4 to 5 steps at both entries Discharge Bathroom Shower/Tub: Tub/shower unit Discharge Bathroom Toilet: Standard Discharge Bathroom Accessibility:  (do not know if wheelchair will fit) Does the patient have any problems obtaining your medications?: No  Wife will stay in camper with kids and she will come to his Mom's home to assist in his care.  Mom's home address is [address] [place] . This is in [place] near [place], [place]. I explained on 10/7 to couple that they need to have ramp built at his Mom's home asap and for wife to investigate outpatient neuro rehab offices for his follow up once discharged home.(Likely in the [place] or [place] area).  I met with couple on 10/7 with extensive discussions of need for bowel and bladder teaching of both to manage his needs as well as wheelchair level goals and ELOS of 2 to 3 weeks with follow up as an outpatient.  Social/Family/Support Systems Patient Roles:  Spouse Contact Information: [phone] Anticipated Caregiver: [name] Sheard Anticipated Caregiver's Contact Information: see contact numbers Ability/Limitations of Caregiver: min asisst at wheelchair level Caregiver Availability:  (Mom can provide 24/7; wife will be with kids in camper and then come over to asisst) Discharge Plan Discussed with Primary Caregiver: Yes Is Caregiver In Agreement with Plan?: Yes Does Caregiver/Family have Issues with Lodging/Transportation while Pt is in Rehab?:  (wife staying in hospital with pt 24/7)  Goals Patient/Family Goal for Rehab: min assist with PT and OT at wheelchair level Expected length of stay: 21-24 days Pt/Family Agrees to Admission and willing to participate: Yes Program Orientation Provided & Reviewed with Pt/Caregiver Including Roles  & Responsibilities: Yes  Decrease burden of Care through IP rehab admission: n/a  Possible need for SNF placement upon discharge: not anticipated  Patient Condition: I have reviewed medical records from [facility], spoken with CM, and patient and spouse. I met with   patient at the bedside for inpatient rehabilitation assessment.  Patient will benefit from ongoing PT and OT, can actively participate in 3 hours of therapy a day 5 days of the week, and can make measurable gains during the admission.  Patient will also benefit from the coordinated team approach during an Inpatient Acute Rehabilitation admission.  The patient will receive intensive therapy as well as Rehabilitation physician, nursing, social worker, and care management interventions.  Due to bladder management, bowel management, safety, skin/wound care, disease management, medication administration, pain management, and patient education the patient requires 24 hour a day rehabilitation nursing.  The patient is currently overall mod to max assist with transfers only with mobility and basic ADLs.  Discharge setting and therapy post  discharge at home with home health is anticipated.  Patient has agreed to participate in the Acute Inpatient Rehabilitation Program and will admit Saturday 10/8 when bed is available..  Preadmission Screen Completed By:  Cleatrice Burke, 04/05/2021 1:36 PM ______________________________________________________________________   Discussed status with Dr. Naaman Plummer on 04/05/2021 at  1330 and received approval for admission Saturday 10/8 when CIR bed is available.  Admission Coordinator:  Cleatrice Burke, RN, time 7902 Date 04/05/2021   Assessment/Plan: Diagnosis: longitudinal myelitis and paraplegia Does the need for close, 24 hr/day Medical supervision in concert with the patient's rehab needs make it unreasonable for this patient to be served in a less intensive setting? Yes Co-Morbidities requiring supervision/potential complications: pain, neurogenic bladder and bowel Due to bladder management, bowel management, safety, skin/wound care, disease management, medication administration, pain management, and patient education, does the patient require 24 hr/day rehab nursing? Yes Does the patient require coordinated care of a physician, rehab nurse, PT, OT to address physical and functional deficits in the context of the above medical diagnosis(es)? Yes Addressing deficits in the following areas: balance, endurance, locomotion, strength, transferring, bowel/bladder control, bathing, dressing, feeding, grooming, toileting, and psychosocial support Can the patient actively participate in an intensive therapy program of at least 3 hrs of therapy 5 days a week? Yes The potential for patient to make measurable gains while on inpatient rehab is excellent Anticipated functional outcomes upon discharge from inpatient rehab: min assist PT, min assist OT, n/a SLP Estimated rehab length of stay to reach the above functional goals is: 21-24 days Anticipated discharge destination: Home 10. Overall  Rehab/Functional Prognosis: excellent   MD Signature: Meredith Staggers, MD, [facility] 04/06/2021

## 2021-04-02 NOTE — Progress Notes (Signed)
Physical Therapy Treatment Patient Details Name: James Obrien MRN: [id number] DOB: Apr 07, 1986 Today's Date: 04/02/2021   History of Present Illness 35 y.o. male with a PMHx of genitourinary lesions, groin erythema, and tobacco use who presented with multiple seizures described initially as left hand pins-and-needles sensation, left hand shaking, left eye twitching, progressing into GTC seizure lasting 2-5 minutes s/p Versed.  Patient was febrile at 103.1 F on 9/30 and was started on empiric acyclovir pending HSV PCR.  MRI positive for extensive myelitis.    PT Comments    Patient progressing with focus of session on sitting tolerance and sitting balance.  Working toward finding balance point and with bilat UE support able to balance with close S for 15 sec, but noted L wrist bearing much of weight in awkward position so limited time.  Patient also able to initiate lateral scooting transfer toward HOB x 2 trials prior to fatigue.  Feel he will continue to benefit from skilled PT in the acute setting and from follow up CIR level rehab at d/c.     Recommendations for follow up therapy are one component of a multi-disciplinary discharge planning process, led by the attending physician.  Recommendations may be updated based on patient status, additional functional criteria and insurance authorization.  Follow Up Recommendations  CIR     Equipment Recommendations  Wheelchair cushion (measurements PT);Wheelchair (measurements PT);3in1 (PT)    Recommendations for Other Services       Precautions / Restrictions Precautions Precautions: Fall Precaution Comments: paraparesis; check BP Required Braces or Orthoses: Other Brace Other Brace: ace wraps for LE's     Mobility  Bed Mobility Overal bed mobility: Needs Assistance Bed Mobility: Rolling;Sidelying to Sit;Sit to Supine;Supine to Sit Rolling: Mod assist Sidelying to sit: Max assist;+2 for safety/equipment;HOB elevated   Sit to  supine: Max assist;+2 for safety/equipment;+2 for physical assistance   General bed mobility comments: verbal cues throughout for positioning and sequencing, pt able to reach across with RUE to pull himself into sidelying, required max a for management of BLE, and to push trunk into sitting; to supine assist for trunk and legs    Transfers Overall transfer level: Needs assistance   Transfers: Lateral/Scoot Transfers          Lateral/Scoot Transfers: +2 physical assistance;Max assist General transfer comment: cues for technique, assist for positioning feet and pt able to help with scooting towards HOB x 2 prior to fatigue.  Ambulation/Gait                 Stairs             Wheelchair Mobility    Modified Rankin (Stroke Patients Only)       Balance Overall balance assessment: Needs assistance Sitting-balance support: Bilateral upper extremity supported Sitting balance-Leahy Scale: Poor Sitting balance - Comments: mod-max support given for sitting balance, brief moments of clsoe min guard sitting balance; sat EOB about 10 minutes completed work for finding balance point then brushed teeth in sitting.                                    Cognition Arousal/Alertness: Awake/alert Behavior During Therapy: WFL for tasks assessed/performed;Flat affect Overall Cognitive Status: Within Functional Limits for tasks assessed  Exercises      General Comments General comments (skin integrity, edema, etc.): BP stable throughout despite light headedness and nausea      Pertinent Vitals/Pain Pain Assessment: Faces Faces Pain Scale: Hurts a little bit Pain Location: generalized with movement - main complaint of dizziness Pain Descriptors / Indicators: Grimacing Pain Intervention(s): Monitored during session;Limited activity within patient's tolerance    Home Living                       Prior Function            PT Goals (current goals can now be found in the care plan section) Progress towards PT goals: Progressing toward goals    Frequency    Min 4X/week      PT Plan Current plan remains appropriate    Co-evaluation PT/OT/SLP Co-Evaluation/Treatment: Yes Reason for Co-Treatment: To address functional/ADL transfers;Complexity of the patient's impairments (multi-system involvement) PT goals addressed during session: Mobility/safety with mobility;Balance        AM-PAC PT "6 Clicks" Mobility   Outcome Measure  Help needed turning from your back to your side while in a flat bed without using bedrails?: A Lot Help needed moving from lying on your back to sitting on the side of a flat bed without using bedrails?: Total Help needed moving to and from a bed to a chair (including a wheelchair)?: Total Help needed standing up from a chair using your arms (e.g., wheelchair or bedside chair)?: Total Help needed to walk in hospital room?: Total Help needed climbing 3-5 steps with a railing? : Total 6 Click Score: 7    End of Session   Activity Tolerance: Patient limited by fatigue Patient left: in bed;with call bell/phone within reach;with bed alarm set;with family/visitor present   PT Visit Diagnosis: Other abnormalities of gait and mobility (R26.89);Muscle weakness (generalized) (M62.81);Other (comment) (paraparesis)     Time: 1025-1100 PT Time Calculation (min) (ACUTE ONLY): 35 min  Charges:  $Therapeutic Activity: 8-22 mins                     Sheran Lawless, PT Acute Rehabilitation Services [phone] Office:[phone] 04/02/2021    James Obrien 04/02/2021, 4:51 PM

## 2021-04-03 DIAGNOSIS — R569 Unspecified convulsions: Secondary | ICD-10-CM | POA: Diagnosis not present

## 2021-04-03 LAB — BASIC METABOLIC PANEL
Anion gap: 8 (ref 5–15)
BUN: 15 mg/dL (ref 6–20)
CO2: 25 mmol/L (ref 22–32)
Calcium: 8.3 mg/dL — ABNORMAL LOW (ref 8.9–10.3)
Chloride: 106 mmol/L (ref 98–111)
Creatinine, Ser: 0.97 mg/dL (ref 0.61–1.24)
GFR, Estimated: >60 mL/min (ref >60)
Glucose, Bld: 106 mg/dL — ABNORMAL HIGH (ref 70–99)
Potassium: 3.7 mmol/L (ref 3.5–5.1)
Sodium: 139 mmol/L (ref 135–145)

## 2021-04-03 LAB — CBC
HCT: 34.5 % — ABNORMAL LOW (ref 39.0–52.0)
Hemoglobin: 11.5 g/dL — ABNORMAL LOW (ref 13.0–17.0)
MCH: 31.1 pg (ref 26.0–34.0)
MCHC: 33.3 g/dL (ref 30.0–36.0)
MCV: 93.2 fL (ref 80.0–100.0)
Platelets: 257 10*3/uL (ref 150–400)
RBC: 3.7 MIL/uL — ABNORMAL LOW (ref 4.22–5.81)
RDW: 13.5 % (ref 11.5–15.5)
WBC: 7.1 10*3/uL (ref 4.0–10.5)
nRBC: 0 % (ref 0.0–0.2)

## 2021-04-03 LAB — MISC LABCORP TEST (SEND OUT): Labcorp test code: 9985

## 2021-04-03 LAB — VZV PCR, CSF: VZV PCR, CSF: NEGATIVE

## 2021-04-03 LAB — CYTOLOGY - NON PAP

## 2021-04-03 MED ORDER — POLYETHYLENE GLYCOL 3350 17 G PO PACK
17.0000 g | PACK | Freq: Two times a day (BID) | ORAL | Status: AC
  Administered 2021-04-03 – 2021-04-04 (×4): 17 g via ORAL
  Filled 2021-04-03 (×4): qty 1

## 2021-04-03 NOTE — Plan of Care (Signed)
  Problem: Education: Goal: Knowledge of General Education information will improve Description: Including pain rating scale, medication(s)/side effects and non-pharmacologic comfort measures Outcome: Progressing   Problem: Health Behavior/Discharge Planning: Goal: Ability to manage health-related needs will improve Outcome: Progressing   Problem: Clinical Measurements: Goal: Ability to maintain clinical measurements within normal limits will improve Outcome: Progressing Goal: Will remain free from infection Outcome: Progressing Goal: Diagnostic test results will improve Outcome: Progressing   Problem: Nutrition: Goal: Adequate nutrition will be maintained Outcome: Progressing   Problem: Coping: Goal: Level of anxiety will decrease Outcome: Progressing   Problem: Elimination: Goal: Will not experience complications related to bowel motility Outcome: Progressing   Problem: Pain Managment: Goal: General experience of comfort will improve Outcome: Progressing   Problem: Safety: Goal: Ability to remain free from injury will improve Outcome: Progressing   Problem: Skin Integrity: Goal: Risk for impaired skin integrity will decrease Outcome: Progressing   Problem: Education: Goal: Expressions of having a comfortable level of knowledge regarding the disease process will increase Outcome: Progressing   Problem: Coping: Goal: Ability to identify appropriate support needs will improve Outcome: Progressing   Problem: Health Behavior/Discharge Planning: Goal: Compliance with prescribed medication regimen will improve Outcome: Progressing   Problem: Medication: Goal: Risk for medication side effects will decrease Outcome: Progressing   Problem: Clinical Measurements: Goal: Complications related to the disease process, condition or treatment will be avoided or minimized Outcome: Progressing   Problem: Safety: Goal: Verbalization of understanding the information  provided will improve Outcome: Progressing   Problem: Self-Concept: Goal: Level of anxiety will decrease Outcome: Progressing

## 2021-04-03 NOTE — Progress Notes (Signed)
Physical Therapy Treatment Patient Details Name: James Obrien MRN: [id number] DOB: 01-20-1986 Today's Date: 04/03/2021   History of Present Illness 35 y.o. male with a PMHx of genitourinary lesions, groin erythema, and tobacco use who presented with multiple seizures described initially as left hand pins-and-needles sensation, left hand shaking, left eye twitching, progressing into GTC seizure lasting 2-5 minutes s/p Versed.  Patient was febrile at 103.1 F on 9/30 and was started on empiric acyclovir pending HSV PCR.  MRI positive for extensive myelitis.    PT Comments    Pt tolerates treatment well despite reports of lightheadedness. Pt continues to demonstrate impaired core strength, requiring BUE support of bed to maintain balance. Pt demonstrates improvement in bed mobility quality and is able to progress to out of bed transfer to recliner via lateral scoot. Pt will benefit from continued aggressive mobilization to improve sitting balance and transfer quality in an effort to reduce falls risk. PT continues to recommend CIR placement. Pt reports a goal to get into a wheelchair next session.   Recommendations for follow up therapy are one component of a multi-disciplinary discharge planning process, led by the attending physician.  Recommendations may be updated based on patient status, additional functional criteria and insurance authorization.  Follow Up Recommendations  CIR     Equipment Recommendations  Wheelchair cushion (measurements PT);Wheelchair (measurements PT);3in1 (PT) (hoyer lift)    Recommendations for Other Services       Precautions / Restrictions Precautions Precautions: Fall Precaution Comments: paraparesis; check BP Required Braces or Orthoses: Other Brace Other Brace: ace wraps for LE's Restrictions Weight Bearing Restrictions: No     Mobility  Bed Mobility Overal bed mobility: Needs Assistance Bed Mobility: Rolling;Sidelying to Sit Rolling: Mod  assist Sidelying to sit: Mod assist;HOB elevated       General bed mobility comments: pt requires assistance for movement of LEs, able to utilize UE strength to elevate trunk into sitting. Pt with posterior loss of balance during initial attempt due to core weakness    Transfers Overall transfer level: Needs assistance Equipment used: None Transfers: Lateral/Scoot Transfers;Stand Pivot Transfers   Stand pivot transfers: Total assist      Lateral/Scoot Transfers: Max assist;From elevated surface General transfer comment: cues for anterior trunk lean and hand placement. PT utilizing bed pad to maintain anterior trunk lean and prevent posterior loss of balance due to impaired core strength. Pt performs partial sit to stand with PT knee block and BUE support of PT, aiding in removing old bed pad  Ambulation/Gait                 Stairs             Wheelchair Mobility    Modified Rankin (Stroke Patients Only)       Balance Overall balance assessment: Needs assistance Sitting-balance support: Bilateral upper extremity supported Sitting balance-Leahy Scale: Poor Sitting balance - Comments: pt reliant on BUE support of bed, multiple rightward and posterior losses of balance at edge of bed with removal of UE support Postural control: Posterior lean;Right lateral lean Standing balance support: Bilateral upper extremity supported Standing balance-Leahy Scale: Zero Standing balance comment: requires knee block and max-totalA for partial stand                            Cognition Arousal/Alertness: Awake/alert Behavior During Therapy: WFL for tasks assessed/performed;Flat affect Overall Cognitive Status: Within Functional Limits for tasks assessed  Exercises      General Comments General comments (skin integrity, edema, etc.): BP stable, pt reports dizziness upon initialy supine to sit transition. Pt  reports lightheadedness at end of sesison however BP remains stable      Pertinent Vitals/Pain Pain Assessment: No/denies pain    Home Living                      Prior Function            PT Goals (current goals can now be found in the care plan section) Acute Rehab PT Goals Patient Stated Goal: to go outside in a wheelchair Progress towards PT goals: Progressing toward goals    Frequency    Min 4X/week      PT Plan Current plan remains appropriate    Co-evaluation              AM-PAC PT "6 Clicks" Mobility   Outcome Measure  Help needed turning from your back to your side while in a flat bed without using bedrails?: A Lot Help needed moving from lying on your back to sitting on the side of a flat bed without using bedrails?: A Lot Help needed moving to and from a bed to a chair (including a wheelchair)?: A Lot Help needed standing up from a chair using your arms (e.g., wheelchair or bedside chair)?: Total Help needed to walk in hospital room?: Total Help needed climbing 3-5 steps with a railing? : Total 6 Click Score: 9    End of Session   Activity Tolerance: Patient tolerated treatment well Patient left: in chair;with call bell/phone within reach;with family/visitor present Nurse Communication: Mobility status;Need for lift equipment (lift or lateral transfer from flat recliner to bed with drawsheet) PT Visit Diagnosis: Other abnormalities of gait and mobility (R26.89);Muscle weakness (generalized) (M62.81);Other (comment)     Time: 8127-5170 PT Time Calculation (min) (ACUTE ONLY): 23 min  Charges:  $Therapeutic Activity: 23-37 mins                     Arlyss Gandy, PT, DPT Acute Rehabilitation Pager: [phone]    Arlyss Gandy 04/03/2021, 5:41 PM

## 2021-04-03 NOTE — TOC Progression Note (Addendum)
Transition of Care [facility]) - Progression Note    Patient Details  Name: James Obrien MRN: [id number] Date of Birth: [birth year]  [facility] CM/SW Contact  Kermit Balo, RN Phone Number: 04/03/2021, 2:07 PM  Clinical Narrative:    CM has left 2 voicemails at [facility] about referral sent yesterday without a call back.  CM has left voicemail with [facility] and no call back.  TOC following.  1500: received call back from [facility]. They are requesting more therapy notes. CM has faxed them the notes requested: [phone]. Awaiting for [facility] to review new information.    Expected Discharge Plan: IP Rehab Facility Barriers to Discharge: Continued Medical Work up  Expected Discharge Plan and Services Expected Discharge Plan: IP Rehab Facility   Discharge Planning Services: CM Consult   Living arrangements for the past 2 months: Mobile Home                                       Social Determinants of Health (SDOH) Interventions    Readmission Risk Interventions No flowsheet data found.

## 2021-04-03 NOTE — Progress Notes (Signed)
Neurology Progress Note  S: Patient feels fine. Still no movement of LEs. No problems in the UEs. He just wants to go home. Wife said "oh, he is going to rehab". NP enouraged him to stay and go to rehab and it may or will help his LEs. He has tried to void on his own, but has been in and out cath'd due to inability.   O: Current vital signs: BP 111/64 (BP Location: Left Arm)   Pulse (!) 55   Temp 97.9 F (36.6 C) (Oral)   Resp 18   Ht 5\' 11"  (1.803 m)   Wt 74.8 kg   SpO2 99%   BMI 23.01 kg/m  Vital signs in last 24 hours: Temp:  [97.5 F (36.4 C)-98.4 F (36.9 C)] 97.9 F (36.6 C) (10/05 0410) Pulse Rate:  [55-69] 55 (10/05 0410) Resp:  [18-20] 18 (10/05 0410) BP: (104-111)/(58-70) 111/64 (10/05 0410) SpO2:  [97 %-99 %] 99 % (10/05 0410)  GENERAL: Depressed appearing male. Awake, alert in NAD. HEENT: Normocephalic and atraumatic. LUNGS: Normal respiratory effort.  CV: RRR. Ext: warm.  NEURO:  Mental Status: Alert and oriented.  Speech/Language: speech is without aphasia or dysarthria.  Naming, repetition, fluency, and comprehension intact.  Cranial Nerves:  Follows commands.  Strength 5/5 UEs. 0/5 LEs.   Sensation- Intact to light touch bilaterally. Extinction absent to light touch to DSS.    Coordination: FTN intact bilaterally, HKS: no ataxia in BLE. No drift.  Gait- deferred.  Medications  Current Facility-Administered Medications:    acetaminophen (TYLENOL) tablet 650 mg, 650 mg, Oral, Q6H PRN, 650 mg at 04/01/21 2140 **OR** acetaminophen (TYLENOL) suppository 650 mg, 650 mg, Rectal, Q6H PRN, 2141, MD   Chlorhexidine Gluconate Cloth 2 % PADS 6 each, 6 each, Topical, Daily, Kc, Ramesh, MD, 6 each at 04/03/21 1116   enoxaparin (LOVENOX) injection 40 mg, 40 mg, Subcutaneous, Q24H, 06/03/21, MD, 40 mg at 04/02/21 2111   ibuprofen (ADVIL) tablet 600 mg, 600 mg, Oral, Q6H PRN, Chotiner, 2112, MD, 600 mg at 03/29/21 0145   levETIRAcetam (KEPPRA)  tablet 500 mg, 500 mg, Oral, BID, 03/31/21, MD, 500 mg at 04/03/21 1115   LORazepam (ATIVAN) injection 1 mg, 1 mg, Intravenous, Q4H PRN, 06/03/21 A, PA-C   polyethylene glycol (MIRALAX / GLYCOLAX) packet 17 g, 17 g, Oral, BID, Army Melia, MD, 17 g at 04/03/21 1138   tamsulosin (FLOMAX) capsule 0.4 mg, 0.4 mg, Oral, Daily, Kc, Ramesh, MD, 0.4 mg at 04/03/21 1115    No new Imaging  Assessment: 35 y.o. male with PMHx significant tobacco use who presented with multiple focal seizures with retained awareness and 2 focal seizures with secondary generalization. His LP showed evidence of pleocytosis and CBC with elevated WBCC. Acyclovir was started empirically for possible encephalitis and discontinued 10/4 with negative HSV PCR. ID saw and bacterial antibiotics were added after neutrophilic CSF was seen in addition to blood culture results + for Methicillin sensitive Staphylococcus epidermidis. DDx includes infectious encephalomyelitis such as West Nile versus less likely inflammatory encephalomyelitis.  He was treated with a lower dose of steroids to help reduce potential spinal cord swelling, and now has remained neurologically stable and making gains with PT off of steroids for 2 days.  Pending [employer] PCR from [facility] as well as cultures.    Recommendations/Plan:  -f/up labs.  -Likely long term rehab needed.  -Neurology will be available prn.   Pt seen by 34, MSN, APN-BC/Nurse Practitioner/Neuro  and later by MD. Note and plan to be edited as needed by MD.  Pager: [phone]   Patient seen by NP and discussed with myself, billing per NP

## 2021-04-03 NOTE — Progress Notes (Signed)
[facility]   Reason for visit: Follow up on myelitis, encephalitis  Interval History: still no movement of his legs.  Wife at bedside.  WBC 7.1.  remains afebrile.   No antibiotics  Physical Exam: Constitutional:  Vitals:   04/03/21 0115 04/03/21 0410  BP: 104/68 111/64  Pulse: 69 (!) 55  Resp: 18 18  Temp: (!) 97.5 F (36.4 C) 97.9 F (36.6 C)  SpO2: 97% 99%   patient appears in NAD Respiratory: Normal respiratory effort; CTA B Cardiovascular: RRR Neuro: improved movement of hands, arms; no movement of legs  Review of Systems: Constitutional: negative for fevers and chills Integument/breast: negative for rash Hematologic/lymphatic: negative for lymphadenopathy  Lab Results  Component Value Date   WBC 7.1 04/03/2021   HGB 11.5 (L) 04/03/2021   HCT 34.5 (L) 04/03/2021   MCV 93.2 04/03/2021   PLT 257 04/03/2021    Lab Results  Component Value Date   CREATININE 0.97 04/03/2021   BUN 15 04/03/2021   NA 139 04/03/2021   K 3.7 04/03/2021   CL 106 04/03/2021   CO2 25 04/03/2021    Lab Results  Component Value Date   ALT 25 03/28/2021   AST 36 03/28/2021   ALKPHOS 39 03/28/2021     Microbiology: Recent Results (from the past 240 hour(s))  Culture, blood (Routine X 2) w Reflex to ID Panel     Status: None   Collection Time: 03/25/21  5:04 PM   Specimen: BLOOD  Result Value Ref Range Status   Specimen Description   Final    BLOOD RIGHT ANTECUBITAL Performed at [facility], [address] [place], [place] [address]    Special Requests   Final    BOTTLES DRAWN AEROBIC AND ANAEROBIC Blood Culture adequate volume Performed at [facility], [address] [place], [place] [address]    Culture   Final    NO GROWTH 5 DAYS Performed at [facility], [address], [place], [place] [address]    Report Status 03/30/2021 FINAL  Final  Culture, blood (Routine X 2) w Reflex to ID Panel     Status: None    Collection Time: 03/25/21  5:12 PM   Specimen: BLOOD  Result Value Ref Range Status   Specimen Description   Final    BLOOD LEFT ANTECUBITAL Performed at [facility], [address], [place], [place] [address]    Special Requests   Final    BOTTLES DRAWN AEROBIC AND ANAEROBIC Blood Culture adequate volume Performed at [facility], [address] [place], [place] [address]    Culture   Final    NO GROWTH 5 DAYS Performed at [facility], [address], [place], [place] [address]    Report Status 03/30/2021 FINAL  Final  Urine Culture     Status: None   Collection Time: 03/28/21  2:44 PM   Specimen: In/Out Cath Urine  Result Value Ref Range Status   Specimen Description IN/OUT CATH URINE  Final   Special Requests NONE  Final   Culture   Final    NO GROWTH Performed at [facility], [address], [place], [place] [address]    Report Status 03/29/2021 FINAL  Final  Blood Culture (routine x 2)     Status: None   Collection Time: 03/28/21  2:45 PM   Specimen: BLOOD  Result Value Ref  Range Status   Specimen Description BLOOD RIGHT UPPER ARM  Final   Special Requests   Final    BOTTLES DRAWN AEROBIC AND ANAEROBIC Blood Culture results may not be optimal due to an excessive volume of blood received in culture bottles   Culture   Final    NO GROWTH 5 DAYS Performed at [facility], [address], [place], [place] [address]    Report Status 04/02/2021 FINAL  Final  Blood Culture (routine x 2)     Status: Abnormal   Collection Time: 03/28/21  2:50 PM   Specimen: BLOOD  Result Value Ref Range Status   Specimen Description BLOOD LEFT ANTECUBITAL  Final   Special Requests   Final    BOTTLES DRAWN AEROBIC AND ANAEROBIC Blood Culture results may not be optimal due to an excessive volume of blood received in culture bottles   Culture  Setup Time   Final    GRAM POSITIVE COCCI IN CLUSTERS IN BOTH AEROBIC AND ANAEROBIC BOTTLES CRITICAL RESULT  CALLED TO, READ BACK BY AND VERIFIED WITH: PHARMD E.SINCLAIR AT 1255 ON 03/29/2021 BY T.SAAD.    Culture (A)  Final    STAPHYLOCOCCUS EPIDERMIDIS THE SIGNIFICANCE OF ISOLATING THIS ORGANISM FROM A SINGLE SET OF BLOOD CULTURES WHEN MULTIPLE SETS ARE DRAWN IS UNCERTAIN. PLEASE NOTIFY THE MICROBIOLOGY DEPARTMENT WITHIN ONE WEEK IF SPECIATION AND SENSITIVITIES ARE REQUIRED. Performed at [facility], [address], [place], [place] [address]    Report Status 03/31/2021 FINAL  Final  Blood Culture ID Panel (Reflexed)     Status: Abnormal   Collection Time: 03/28/21  2:50 PM  Result Value Ref Range Status   Enterococcus faecalis NOT DETECTED NOT DETECTED Final   Enterococcus Faecium NOT DETECTED NOT DETECTED Final   Listeria monocytogenes NOT DETECTED NOT DETECTED Final   Staphylococcus species DETECTED (A) NOT DETECTED Final    Comment: CRITICAL RESULT CALLED TO, READ BACK BY AND VERIFIED WITH: PHARMD E.SINCLAIR AT 1255 ON 03/29/2021 BY T.SAAD.    Staphylococcus aureus (BCID) NOT DETECTED NOT DETECTED Final   Staphylococcus epidermidis DETECTED (A) NOT DETECTED Final    Comment: CRITICAL RESULT CALLED TO, READ BACK BY AND VERIFIED WITH: PHARMD E.SINCLAIR AT 1255 ON 03/29/2021 BY T.SAAD.    Staphylococcus lugdunensis NOT DETECTED NOT DETECTED Final   Streptococcus species NOT DETECTED NOT DETECTED Final   Streptococcus agalactiae NOT DETECTED NOT DETECTED Final   Streptococcus pneumoniae NOT DETECTED NOT DETECTED Final   Streptococcus pyogenes NOT DETECTED NOT DETECTED Final   A.calcoaceticus-baumannii NOT DETECTED NOT DETECTED Final   Bacteroides fragilis NOT DETECTED NOT DETECTED Final   Enterobacterales NOT DETECTED NOT DETECTED Final   Enterobacter cloacae complex NOT DETECTED NOT DETECTED Final   Escherichia coli NOT DETECTED NOT DETECTED Final   Klebsiella aerogenes NOT DETECTED NOT DETECTED Final   Klebsiella oxytoca NOT DETECTED NOT DETECTED Final   Klebsiella pneumoniae  NOT DETECTED NOT DETECTED Final   Proteus species NOT DETECTED NOT DETECTED Final   Salmonella species NOT DETECTED NOT DETECTED Final   Serratia marcescens NOT DETECTED NOT DETECTED Final   Haemophilus influenzae NOT DETECTED NOT DETECTED Final   Neisseria meningitidis NOT DETECTED NOT DETECTED Final   Pseudomonas aeruginosa NOT DETECTED NOT DETECTED Final   Stenotrophomonas maltophilia NOT DETECTED NOT DETECTED Final   Candida albicans NOT DETECTED NOT DETECTED Final   Candida auris NOT DETECTED NOT DETECTED Final   Candida glabrata NOT DETECTED NOT DETECTED Final   Candida krusei  NOT DETECTED NOT DETECTED Final   Candida parapsilosis NOT DETECTED NOT DETECTED Final   Candida tropicalis NOT DETECTED NOT DETECTED Final   Cryptococcus neoformans/gattii NOT DETECTED NOT DETECTED Final   Methicillin resistance mecA/C NOT DETECTED NOT DETECTED Final    Comment: Performed at [facility], [address] [place], [place] [address]  CSF culture w Stat Gram Stain     Status: None   Collection Time: 03/29/21  2:53 PM   Specimen: CSF; Cerebrospinal Fluid  Result Value Ref Range Status   Specimen Description CSF  Final   Special Requests NONE  Final   Gram Stain   Final    WBC PRESENT,BOTH PMN AND MONONUCLEAR NO ORGANISMS SEEN CYTOSPIN SMEAR    Culture   Final    NO GROWTH 3 DAYS Performed at [facility], [address], [place], [place] [address]    Report Status 04/02/2021 FINAL  Final  Resp Panel by RT-PCR (Flu A&B, Covid) Nasopharyngeal Swab     Status: None   Collection Time: 03/29/21  3:12 PM   Specimen: Nasopharyngeal Swab; Nasopharyngeal(NP) swabs in vial transport medium  Result Value Ref Range Status   SARS Coronavirus 2 by RT PCR NEGATIVE NEGATIVE Final    Comment: (NOTE) SARS-CoV-2 target nucleic acids are NOT DETECTED.  The SARS-CoV-2 RNA is generally detectable in upper respiratory specimens during the acute phase of infection. The lowest concentration of  SARS-CoV-2 viral copies this assay can detect is 138 copies/mL. A negative result does not preclude SARS-Cov-2 infection and should not be used as the sole basis for treatment or other patient management decisions. A negative result may occur with  improper specimen collection/handling, submission of specimen other than nasopharyngeal swab, presence of viral mutation(s) within the areas targeted by this assay, and inadequate number of viral copies(<138 copies/mL). A negative result must be combined with clinical observations, patient history, and epidemiological information. The expected result is Negative.  Fact Sheet for Patients:  BloggerCourse.com  Fact Sheet for Healthcare Providers:  SeriousBroker.it  This test is no t yet approved or cleared by the [place] FDA and  has been authorized for detection and/or diagnosis of SARS-CoV-2 by FDA under an Emergency Use Authorization (EUA). This EUA will remain  in effect (meaning this test can be used) for the duration of the COVID-19 declaration under Section 564(b)(1) of the Act, 21 U.S.C.section 360bbb-3(b)(1), unless the authorization is terminated  or revoked sooner.       Influenza A by PCR NEGATIVE NEGATIVE Final   Influenza B by PCR NEGATIVE NEGATIVE Final    Comment: (NOTE) The Xpert Xpress SARS-CoV-2/FLU/RSV plus assay is intended as an aid in the diagnosis of influenza from Nasopharyngeal swab specimens and should not be used as a sole basis for treatment. Nasal washings and aspirates are unacceptable for Xpert Xpress SARS-CoV-2/FLU/RSV testing.  Fact Sheet for Patients: BloggerCourse.com  Fact Sheet for Healthcare Providers: SeriousBroker.it  This test is not yet approved or cleared by the [place] FDA and has been authorized for detection and/or diagnosis of SARS-CoV-2 by FDA under an Emergency Use  Authorization (EUA). This EUA will remain in effect (meaning this test can be used) for the duration of the COVID-19 declaration under Section 564(b)(1) of the Act, 21 U.S.C. section 360bbb-3(b)(1), unless the authorization is terminated or revoked.  Performed at [facility], [address] [place], [place] [address]     Impression/Plan:  1. Myelitis - no positive findings.  Still waiting on VZV, arboviral infections, [place] virus.   All other testing to date negative.  No indication for antibiotics and acyclovir now negative.   Autoimmune tests pending as well.    2.  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    3.  Seizures - no further seizure activity.  On Keppra.     4.  Leukocytosis - now resolved, off of steroids.

## 2021-04-03 NOTE — Progress Notes (Signed)
[facility] PROGRESS NOTE    Progress Note  James Obrien  XTG:[id number] DOB: Apr 14, 1986 DOA: 03/28/2021 PCP: Pcp, No     Brief Narrative:   James Obrien is an 35 y.o. male no significant past medical history brought into the ED for generalized tonic clonic seizures, patient was also complaining of left groin area that had a and some discharge from his penis recently treated for gonorrhea in the ED was found to be febrile, blood cultures were ordered CT of the head showed no acute findings MRI of the brain showed abnormal hyperintensity and T2 in both hemispheres concerning for acute demyelinating disease, and underwent MRI of the brain with contrast that favor demyelinating disease.  CT scan of the abdomen and pelvis in the left groin area shows cellulitis and inguinal lymphadenopathy. He was seen by neurologist underwent lumbar puncture and meningitis treatment.    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 Patient has undergone extensive evaluation with lumbar puncture. Evaluated by neurology and ID. EEG showed cortical dysfunction of the left hemisphere nonspecific. Possibilities includes infectious encephalitis likely due to [place] Nile virus versus inflammatory encephalitis. Mentation has improved but no improvement of movement of lower extremities he was started on high-dose steroids for which she completed 3-day course on 04/01/2021. Infectious work-up has been negative, ID on board and he completed a 5-day course of antibiotics, HSV PCR is negative and acyclovir has been discontinued. He is currently on Keppra. They are waiting follow-up of autoimmune inflammatory markers. Physical therapy evaluated the patient recommended CIR still awaiting placement. The only pending result will be VDR which require immediate treatment.  Sinus bradycardia: Echocardiogram was unremarkable needed atropine in the ED  seen by cardiology they recommended no further work-up.  Mild leukocytosis: Likely due to steroids, has remained afebrile.  Steroids have been discontinued on 04/01/2021.  Urinary retention: We will try a voiding trial he is currently on Flomax.   DVT prophylaxis: lovenox Family Communication:wife Status is: Inpatient  Remains inpatient appropriate because:Hemodynamically unstable  Dispo: The patient is from: Home              Anticipated d/c is to: CIR              Patient currently is not medically stable to d/c.   Difficult to place patient No        Code Status:     Code Status Orders  (From admission, onward)           Start     Ordered   03/28/21 1919  Full code  Continuous        03/28/21 1920           Code Status History     This patient has a current code status but no historical code status.         IV Access:   Peripheral IV   Procedures and diagnostic studies:   DG CHEST PORT 1 VIEW  Result Date: 04/01/2021 CLINICAL DATA:  Left-sided chest pain EXAM: PORTABLE CHEST 1 VIEW COMPARISON:  03/28/2021 FINDINGS: The heart size and mediastinal contours are within normal limits. No focal airspace consolidation, pleural effusion, or pneumothorax. The visualized skeletal structures are unremarkable. IMPRESSION: No active disease. Electronically Signed   By: Duanne Guess D.O.   On: 04/01/2021 20:14     Medical Consultants:   None.   Subjective:    James Obrien has no new  complaints  Objective:    Vitals:   04/02/21 1630 04/02/21 1952 04/03/21 0115 04/03/21 0410  BP: 111/70 (!) 108/58 104/68 111/64  Pulse: 65 61 69 (!) 55  Resp: 20 18 18 18   Temp: 97.9 F (36.6 C) 98.2 F (36.8 C) (!) 97.5 F (36.4 C) 97.9 F (36.6 C)  TempSrc: Oral Oral Oral Oral  SpO2: 99% 99% 97% 99%  Weight:      Height:       SpO2: 99 % O2 Flow Rate (L/min): 2 L/min   Intake/Output Summary (Last 24 hours) at 04/03/2021 1043 Last data  filed at 04/03/2021 0601 Gross per 24 hour  Intake 100 ml  Output 1120 ml  Net -1020 ml   Filed Weights   03/28/21 1500  Weight: 74.8 kg    Exam: General exam: In no acute distress. Respiratory system: Good air movement and clear to auscultation. Cardiovascular system: S1 & S2 heard, RRR. No JVD. Gastrointestinal system: Abdomen is nondistended, soft and nontender.  Extremities: No pedal edema. Skin: No rashes, lesions or ulcers Psychiatry: Judgement and insight appear normal. Mood & affect appropriate.    Data Reviewed:    Labs: Basic Metabolic Panel: Recent Labs  Lab 03/28/21 1930 03/29/21 0438 03/30/21 0135 03/31/21 1018 04/01/21 0330 04/03/21 0245  NA  --  138 137 139 138 139  K  --  3.4* 3.8 4.2 4.1 3.7  CL  --  109 109 107 106 106  CO2  --  21* 18* 22 24 25   GLUCOSE  --  100* 97 120* 136* 106*  BUN  --  8 7 8 10 15   CREATININE  --  1.00 1.10 0.74 0.77 0.97  CALCIUM  --  8.4* 8.0* 8.7* 8.5* 8.3*  MG 2.3  --  1.9  --   --   --    GFR Estimated Creatinine Clearance: 114.6 mL/min (by C-G formula based on SCr of 0.97 mg/dL). Liver Function Tests: Recent Labs  Lab 03/28/21 1444  AST 36  ALT 25  ALKPHOS 39  BILITOT 0.4  PROT 6.9  ALBUMIN 3.6   No results for input(s): LIPASE, AMYLASE in the last 168 hours. No results for input(s): AMMONIA in the last 168 hours. Coagulation profile Recent Labs  Lab 03/28/21 1444  INR 1.1   COVID-19 Labs  No results for input(s): DDIMER, FERRITIN, LDH, CRP in the last 72 hours.  Lab Results  Component Value Date   SARSCOV2NAA NEGATIVE 03/29/2021    CBC: Recent Labs  Lab 03/28/21 1444 03/28/21 1525 03/29/21 0438 03/30/21 0942 03/31/21 1018 04/01/21 0330 04/03/21 0245  WBC 10.8*  --  10.1 10.6* 10.7* 12.2* 7.1  NEUTROABS 7.8*  --   --  7.6 8.0* 9.9*  --   HGB 12.9*   < > 11.2* 11.8* 13.2 11.9* 11.5*  HCT 39.2   < > 34.8* 37.1* 39.0 35.0* 34.5*  MCV 92.9  --  94.1 94.2 91.3 90.2 93.2  PLT 214  --  222  215 259 271 257   < > = values in this interval not displayed.   Cardiac Enzymes: No results for input(s): CKTOTAL, CKMB, CKMBINDEX, TROPONINI in the last 168 hours. BNP (last 3 results) No results for input(s): PROBNP in the last 8760 hours. CBG: No results for input(s): GLUCAP in the last 168 hours. D-Dimer: No results for input(s): DDIMER in the last 72 hours. Hgb A1c: No results for input(s): HGBA1C in the last 72 hours. Lipid Profile: No results  for input(s): CHOL, HDL, LDLCALC, TRIG, CHOLHDL, LDLDIRECT in the last 72 hours. Thyroid function studies: No results for input(s): TSH, T4TOTAL, T3FREE, THYROIDAB in the last 72 hours.  Invalid input(s): FREET3 Anemia work up: No results for input(s): VITAMINB12, FOLATE, FERRITIN, TIBC, IRON, RETICCTPCT in the last 72 hours. Sepsis Labs: Recent Labs  Lab 03/28/21 1444 03/28/21 1908 03/29/21 0438 03/30/21 0942 03/31/21 1018 04/01/21 0330 04/03/21 0245  WBC 10.8*  --    < > 10.6* 10.7* 12.2* 7.1  LATICACIDVEN 4.1* 1.0  --   --   --   --   --    < > = values in this interval not displayed.   Microbiology Recent Results (from the past 240 hour(s))  Culture, blood (Routine X 2) w Reflex to ID Panel     Status: None   Collection Time: 03/25/21  5:04 PM   Specimen: BLOOD  Result Value Ref Range Status   Specimen Description   Final    BLOOD RIGHT ANTECUBITAL Performed at [facility], [address] [place], [place] [address]    Special Requests   Final    BOTTLES DRAWN AEROBIC AND ANAEROBIC Blood Culture adequate volume Performed at [facility], [address] [place], [place] [address]    Culture   Final    NO GROWTH 5 DAYS Performed at [facility], [address], [place], [place] [address]    Report Status 03/30/2021 FINAL  Final  Culture, blood (Routine X 2) w Reflex to ID Panel     Status: None   Collection Time: 03/25/21  5:12 PM   Specimen: BLOOD  Result Value Ref Range Status    Specimen Description   Final    BLOOD LEFT ANTECUBITAL Performed at [facility], [address], [place], [place] [address]    Special Requests   Final    BOTTLES DRAWN AEROBIC AND ANAEROBIC Blood Culture adequate volume Performed at [facility], [address] [place], [place] [address]    Culture   Final    NO GROWTH 5 DAYS Performed at [facility], [address] [place], [place] [address]    Report Status 03/30/2021 FINAL  Final  Urine Culture     Status: None   Collection Time: 03/28/21  2:44 PM   Specimen: In/Out Cath Urine  Result Value Ref Range Status   Specimen Description IN/OUT CATH URINE  Final   Special Requests NONE  Final   Culture   Final    NO GROWTH Performed at [facility], [address], [place], [place] [address]    Report Status 03/29/2021 FINAL  Final  Blood Culture (routine x 2)     Status: None   Collection Time: 03/28/21  2:45 PM   Specimen: BLOOD  Result Value Ref Range Status   Specimen Description BLOOD RIGHT UPPER ARM  Final   Special Requests   Final    BOTTLES DRAWN AEROBIC AND ANAEROBIC Blood Culture results may not be optimal due to an excessive volume of blood received in culture bottles   Culture   Final    NO GROWTH 5 DAYS Performed at [facility], [address] [place], [place] [address]    Report Status 04/02/2021 FINAL  Final  Blood Culture (routine x 2)     Status: Abnormal   Collection Time: 03/28/21  2:50 PM   Specimen: BLOOD  Result  Value Ref Range Status   Specimen Description BLOOD LEFT ANTECUBITAL  Final   Special Requests   Final    BOTTLES DRAWN AEROBIC AND ANAEROBIC Blood Culture results may not be optimal due to an excessive volume of blood received in culture bottles   Culture  Setup Time   Final    GRAM POSITIVE COCCI IN CLUSTERS IN BOTH AEROBIC AND ANAEROBIC BOTTLES CRITICAL RESULT CALLED TO, READ BACK BY AND VERIFIED WITH: PHARMD E.SINCLAIR AT 1255 ON 03/29/2021 BY  T.SAAD.    Culture (A)  Final    STAPHYLOCOCCUS EPIDERMIDIS THE SIGNIFICANCE OF ISOLATING THIS ORGANISM FROM A SINGLE SET OF BLOOD CULTURES WHEN MULTIPLE SETS ARE DRAWN IS UNCERTAIN. PLEASE NOTIFY THE MICROBIOLOGY DEPARTMENT WITHIN ONE WEEK IF SPECIATION AND SENSITIVITIES ARE REQUIRED. Performed at [facility], [address], [place], [place] [address]    Report Status 03/31/2021 FINAL  Final  Blood Culture ID Panel (Reflexed)     Status: Abnormal   Collection Time: 03/28/21  2:50 PM  Result Value Ref Range Status   Enterococcus faecalis NOT DETECTED NOT DETECTED Final   Enterococcus Faecium NOT DETECTED NOT DETECTED Final   Listeria monocytogenes NOT DETECTED NOT DETECTED Final   Staphylococcus species DETECTED (A) NOT DETECTED Final    Comment: CRITICAL RESULT CALLED TO, READ BACK BY AND VERIFIED WITH: PHARMD E.SINCLAIR AT 1255 ON 03/29/2021 BY T.SAAD.    Staphylococcus aureus (BCID) NOT DETECTED NOT DETECTED Final   Staphylococcus epidermidis DETECTED (A) NOT DETECTED Final    Comment: CRITICAL RESULT CALLED TO, READ BACK BY AND VERIFIED WITH: PHARMD E.SINCLAIR AT 1255 ON 03/29/2021 BY T.SAAD.    Staphylococcus lugdunensis NOT DETECTED NOT DETECTED Final   Streptococcus species NOT DETECTED NOT DETECTED Final   Streptococcus agalactiae NOT DETECTED NOT DETECTED Final   Streptococcus pneumoniae NOT DETECTED NOT DETECTED Final   Streptococcus pyogenes NOT DETECTED NOT DETECTED Final   A.calcoaceticus-baumannii NOT DETECTED NOT DETECTED Final   Bacteroides fragilis NOT DETECTED NOT DETECTED Final   Enterobacterales NOT DETECTED NOT DETECTED Final   Enterobacter cloacae complex NOT DETECTED NOT DETECTED Final   Escherichia coli NOT DETECTED NOT DETECTED Final   Klebsiella aerogenes NOT DETECTED NOT DETECTED Final   Klebsiella oxytoca NOT DETECTED NOT DETECTED Final   Klebsiella pneumoniae NOT DETECTED NOT DETECTED Final   Proteus species NOT DETECTED NOT DETECTED Final    Salmonella species NOT DETECTED NOT DETECTED Final   Serratia marcescens NOT DETECTED NOT DETECTED Final   Haemophilus influenzae NOT DETECTED NOT DETECTED Final   Neisseria meningitidis NOT DETECTED NOT DETECTED Final   Pseudomonas aeruginosa NOT DETECTED NOT DETECTED Final   Stenotrophomonas maltophilia NOT DETECTED NOT DETECTED Final   Candida albicans NOT DETECTED NOT DETECTED Final   Candida auris NOT DETECTED NOT DETECTED Final   Candida glabrata NOT DETECTED NOT DETECTED Final   Candida krusei NOT DETECTED NOT DETECTED Final   Candida parapsilosis NOT DETECTED NOT DETECTED Final   Candida tropicalis NOT DETECTED NOT DETECTED Final   Cryptococcus neoformans/gattii NOT DETECTED NOT DETECTED Final   Methicillin resistance mecA/C NOT DETECTED NOT DETECTED Final    Comment: Performed at [facility], [address] [place], [place] [address]  CSF culture w Stat Gram Stain     Status: None   Collection Time: 03/29/21  2:53 PM   Specimen: CSF; Cerebrospinal Fluid  Result Value Ref Range Status   Specimen Description CSF  Final   Special Requests NONE  Final  Gram Stain   Final    WBC PRESENT,BOTH PMN AND MONONUCLEAR NO ORGANISMS SEEN CYTOSPIN SMEAR    Culture   Final    NO GROWTH 3 DAYS Performed at [facility], [address] [place], [place] [address]    Report Status 04/02/2021 FINAL  Final  Resp Panel by RT-PCR (Flu A&B, Covid) Nasopharyngeal Swab     Status: None   Collection Time: 03/29/21  3:12 PM   Specimen: Nasopharyngeal Swab; Nasopharyngeal(NP) swabs in vial transport medium  Result Value Ref Range Status   SARS Coronavirus 2 by RT PCR NEGATIVE NEGATIVE Final    Comment: (NOTE) SARS-CoV-2 target nucleic acids are NOT DETECTED.  The SARS-CoV-2 RNA is generally detectable in upper respiratory specimens during the acute phase of infection. The lowest concentration of SARS-CoV-2 viral copies this assay can detect is 138 copies/mL. A negative result  does not preclude SARS-Cov-2 infection and should not be used as the sole basis for treatment or other patient management decisions. A negative result may occur with  improper specimen collection/handling, submission of specimen other than nasopharyngeal swab, presence of viral mutation(s) within the areas targeted by this assay, and inadequate number of viral copies(<138 copies/mL). A negative result must be combined with clinical observations, patient history, and epidemiological information. The expected result is Negative.  Fact Sheet for Patients:  BloggerCourse.com  Fact Sheet for Healthcare Providers:  SeriousBroker.it  This test is no t yet approved or cleared by the [place] FDA and  has been authorized for detection and/or diagnosis of SARS-CoV-2 by FDA under an Emergency Use Authorization (EUA). This EUA will remain  in effect (meaning this test can be used) for the duration of the COVID-19 declaration under Section 564(b)(1) of the Act, 21 U.S.C.section 360bbb-3(b)(1), unless the authorization is terminated  or revoked sooner.       Influenza A by PCR NEGATIVE NEGATIVE Final   Influenza B by PCR NEGATIVE NEGATIVE Final    Comment: (NOTE) The Xpert Xpress SARS-CoV-2/FLU/RSV plus assay is intended as an aid in the diagnosis of influenza from Nasopharyngeal swab specimens and should not be used as a sole basis for treatment. Nasal washings and aspirates are unacceptable for Xpert Xpress SARS-CoV-2/FLU/RSV testing.  Fact Sheet for Patients: BloggerCourse.com  Fact Sheet for Healthcare Providers: SeriousBroker.it  This test is not yet approved or cleared by the [place] FDA and has been authorized for detection and/or diagnosis of SARS-CoV-2 by FDA under an Emergency Use Authorization (EUA). This EUA will remain in effect (meaning this test can be used) for  the duration of the COVID-19 declaration under Section 564(b)(1) of the Act, 21 U.S.C. section 360bbb-3(b)(1), unless the authorization is terminated or revoked.  Performed at [facility], [address] [place], [place] [address]      Medications:    Chlorhexidine Gluconate Cloth  6 each Topical Daily   enoxaparin (LOVENOX) injection  40 mg Subcutaneous Q24H   levETIRAcetam  500 mg Oral BID   tamsulosin  0.4 mg Oral Daily   Continuous Infusions:    LOS: 5 days   Marinda Elk  [facility]  04/03/2021, 10:43 AM

## 2021-04-03 NOTE — Progress Notes (Signed)
Patient family assisted in patient to void with urinal, patient anuric at 04/02/21 1230 am. Patient bladder scanned at 0100 on 04/03/21 resulted 321 ml. Bladder re-scanned at 0520 resulted 971 ml. In and Out Cath was performed per post foley. Patient stated he can feel me touching his waist and legs but still unable to move his legs.

## 2021-04-03 NOTE — Progress Notes (Signed)
PT Cancellation Note  Patient Details Name: James Obrien MRN: [id number] DOB: 1986-06-15   Cancelled Treatment:    Reason Eval/Treat Not Completed: (P) Other (comment) (pt eating lunch then needed straight cath per nsg staff, will continue efforts per PT POC as schedule permits.)   Angus Palms 04/03/2021, 4:33 PM

## 2021-04-04 NOTE — TOC Progression Note (Signed)
Transition of Care [facility]) - Progression Note    Patient Details  Name: James Obrien MRN: [id number] Date of Birth: Apr 19, 1986  Transition of [facility]) CM/SW Contact  Kermit Balo, RN Phone Number: 04/04/2021, 1:17 PM  Clinical Narrative:    CM received a call from [facility] that they will not be able to offer a bed for the patient. CM has updated the patient, his spouse and CIR.  CM has left another voicemail for [employer].  TOC following.   Expected Discharge Plan: IP Rehab Facility Barriers to Discharge: Continued Medical Work up  Expected Discharge Plan and Services Expected Discharge Plan: IP Rehab Facility   Discharge Planning Services: CM Consult   Living arrangements for the past 2 months: Mobile Home                                       Social Determinants of Health (SDOH) Interventions    Readmission Risk Interventions No flowsheet data found.

## 2021-04-04 NOTE — Progress Notes (Signed)
IP rehab admissions - I have authorization for acute inpatient rehab admissions.  I have no bed available for this patient today.  I will have my partners follow up tomorrow for bed availability and potential inpatient rehab admission.  Call for questions.  [phone]

## 2021-04-04 NOTE — Progress Notes (Signed)
[facility] PROGRESS NOTE    Progress Note  James Obrien  EQA:[id number] DOB: June 26, 1986 DOA: 03/28/2021 PCP: Pcp, No     Brief Narrative:   James Obrien is an 35 y.o. male no significant past medical history brought into the ED for generalized tonic clonic seizures, patient was also complaining of left groin area that had a and some discharge from his penis recently treated for gonorrhea in the ED was found to be febrile, blood cultures were ordered CT of the head showed no acute findings MRI of the brain showed abnormal hyperintensity and T2 in both hemispheres concerning for acute demyelinating disease, and underwent MRI of the brain with contrast that favor demyelinating disease.  CT scan of the abdomen and pelvis in the left groin area shows cellulitis and inguinal lymphadenopathy. He was seen by neurologist underwent lumbar puncture and meningitis treatment.    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 Patient has undergone extensive evaluation with lumbar puncture. Evaluated by neurology and ID. EEG showed cortical dysfunction of the left hemisphere nonspecific. Possibilities includes infectious encephalitis likely due to [place] Nile virus versus inflammatory encephalitis. Mentation has improved but no improvement of movement of lower extremities he was started on high-dose steroids for which she completed 3-day course on 04/01/2021. Infectious work-up has been negative, ID on board and he completed a 5-day course of antibiotics, HSV PCR is negative and acyclovir has been discontinued. He is currently on Keppra. They are waiting follow-up of autoimmune inflammatory markers. Physical therapy evaluated the patient recommended CIR still awaiting placement.  Sinus bradycardia: Echocardiogram was unremarkable needed atropine in the ED seen by cardiology they recommended no further work-up.  Mild  leukocytosis: Likely due to steroids, has remained afebrile.  Steroids have been discontinued on 04/01/2021.  Urinary retention: We will try a voiding trial he is currently on Flomax.   DVT prophylaxis: lovenox Family Communication:wife Status is: Inpatient  Remains inpatient appropriate because:Hemodynamically unstable  Dispo: The patient is from: Home              Anticipated d/c is to: CIR              Patient currently is not medically stable to d/c.   Difficult to place patient No        Code Status:     Code Status Orders  (From admission, onward)           Start     Ordered   03/28/21 1919  Full code  Continuous        03/28/21 1920           Code Status History     This patient has a current code status but no historical code status.         IV Access:   Peripheral IV   Procedures and diagnostic studies:   No results found.   Medical Consultants:   None.   Subjective:    James Obrien  no new complaints  Objective:    Vitals:   04/03/21 2038 04/04/21 0121 04/04/21 0429 04/04/21 0808  BP: (!) 108/57 112/62 113/68 111/68  Pulse: (!) 58 (!) 47 (!) 45 (!) 57  Resp: 18 14 16 17   Temp: 98.4 F (36.9 C) 97.8 F (36.6 C) 98 F (36.7 C) 98.1 F (36.7 C)  TempSrc: Oral Axillary Axillary Oral  SpO2: 98% 99% 99% 98%  Weight:  Height:       SpO2: 98 % O2 Flow Rate (L/min): 2 L/min   Intake/Output Summary (Last 24 hours) at 04/04/2021 1026 Last data filed at 04/04/2021 0118 Gross per 24 hour  Intake --  Output 1627 ml  Net -1627 ml    Filed Weights   03/28/21 1500  Weight: 74.8 kg    Exam: General exam: In no acute distress. Respiratory system: Good air movement and clear to auscultation. Cardiovascular system: S1 & S2 heard, RRR. No JVD. Gastrointestinal system: Abdomen is nondistended, soft and nontender.  Extremities: No pedal edema. Skin: No rashes, lesions or ulcers Psychiatry: Judgement and  insight appear normal. Mood & affect appropriate.   Data Reviewed:    Labs: Basic Metabolic Panel: Recent Labs  Lab 03/28/21 1930 03/29/21 0438 03/30/21 0135 03/31/21 1018 04/01/21 0330 04/03/21 0245  NA  --  138 137 139 138 139  K  --  3.4* 3.8 4.2 4.1 3.7  CL  --  109 109 107 106 106  CO2  --  21* 18* 22 24 25   GLUCOSE  --  100* 97 120* 136* 106*  BUN  --  8 7 8 10 15   CREATININE  --  1.00 1.10 0.74 0.77 0.97  CALCIUM  --  8.4* 8.0* 8.7* 8.5* 8.3*  MG 2.3  --  1.9  --   --   --     GFR Estimated Creatinine Clearance: 114.6 mL/min (by C-G formula based on SCr of 0.97 mg/dL). Liver Function Tests: Recent Labs  Lab 03/28/21 1444  AST 36  ALT 25  ALKPHOS 39  BILITOT 0.4  PROT 6.9  ALBUMIN 3.6    No results for input(s): LIPASE, AMYLASE in the last 168 hours. No results for input(s): AMMONIA in the last 168 hours. Coagulation profile Recent Labs  Lab 03/28/21 1444  INR 1.1    COVID-19 Labs  No results for input(s): DDIMER, FERRITIN, LDH, CRP in the last 72 hours.  Lab Results  Component Value Date   SARSCOV2NAA NEGATIVE 03/29/2021    CBC: Recent Labs  Lab 03/28/21 1444 03/28/21 1525 03/29/21 0438 03/30/21 0942 03/31/21 1018 04/01/21 0330 04/03/21 0245  WBC 10.8*  --  10.1 10.6* 10.7* 12.2* 7.1  NEUTROABS 7.8*  --   --  7.6 8.0* 9.9*  --   HGB 12.9*   < > 11.2* 11.8* 13.2 11.9* 11.5*  HCT 39.2   < > 34.8* 37.1* 39.0 35.0* 34.5*  MCV 92.9  --  94.1 94.2 91.3 90.2 93.2  PLT 214  --  222 215 259 271 257   < > = values in this interval not displayed.    Cardiac Enzymes: No results for input(s): CKTOTAL, CKMB, CKMBINDEX, TROPONINI in the last 168 hours. BNP (last 3 results) No results for input(s): PROBNP in the last 8760 hours. CBG: No results for input(s): GLUCAP in the last 168 hours. D-Dimer: No results for input(s): DDIMER in the last 72 hours. Hgb A1c: No results for input(s): HGBA1C in the last 72 hours. Lipid Profile: No results  for input(s): CHOL, HDL, LDLCALC, TRIG, CHOLHDL, LDLDIRECT in the last 72 hours. Thyroid function studies: No results for input(s): TSH, T4TOTAL, T3FREE, THYROIDAB in the last 72 hours.  Invalid input(s): FREET3 Anemia work up: No results for input(s): VITAMINB12, FOLATE, FERRITIN, TIBC, IRON, RETICCTPCT in the last 72 hours. Sepsis Labs: Recent Labs  Lab 03/28/21 1444 03/28/21 1908 03/29/21 03/30/21 03/30/21 8921 03/31/21 1018 04/01/21 0330 04/03/21 0245  WBC 10.8*  --    < > 10.6* 10.7* 12.2* 7.1  LATICACIDVEN 4.1* 1.0  --   --   --   --   --    < > = values in this interval not displayed.    Microbiology Recent Results (from the past 240 hour(s))  Culture, blood (Routine X 2) w Reflex to ID Panel     Status: None   Collection Time: 03/25/21  5:04 PM   Specimen: BLOOD  Result Value Ref Range Status   Specimen Description   Final    BLOOD RIGHT ANTECUBITAL Performed at [facility], [address] [place], [place] [address]    Special Requests   Final    BOTTLES DRAWN AEROBIC AND ANAEROBIC Blood Culture adequate volume Performed at [facility], [address] [place], [place] [address]    Culture   Final    NO GROWTH 5 DAYS Performed at [facility], [address] [place], [place] [address]    Report Status 03/30/2021 FINAL  Final  Culture, blood (Routine X 2) w Reflex to ID Panel     Status: None   Collection Time: 03/25/21  5:12 PM   Specimen: BLOOD  Result Value Ref Range Status   Specimen Description   Final    BLOOD LEFT ANTECUBITAL Performed at [facility], [address], [place], [place] [address]    Special Requests   Final    BOTTLES DRAWN AEROBIC AND ANAEROBIC Blood Culture adequate volume Performed at [facility], [address] [place], [place] [address]    Culture   Final    NO GROWTH 5 DAYS Performed at [facility], [address], [place], [place] [address]    Report Status 03/30/2021 FINAL   Final  Urine Culture     Status: None   Collection Time: 03/28/21  2:44 PM   Specimen: In/Out Cath Urine  Result Value Ref Range Status   Specimen Description IN/OUT CATH URINE  Final   Special Requests NONE  Final   Culture   Final    NO GROWTH Performed at [facility], [address] [place], [place] [address]    Report Status 03/29/2021 FINAL  Final  Blood Culture (routine x 2)     Status: None   Collection Time: 03/28/21  2:45 PM   Specimen: BLOOD  Result Value Ref Range Status   Specimen Description BLOOD RIGHT UPPER ARM  Final   Special Requests   Final    BOTTLES DRAWN AEROBIC AND ANAEROBIC Blood Culture results may not be optimal due to an excessive volume of blood received in culture bottles   Culture   Final    NO GROWTH 5 DAYS Performed at [facility], [address] [place], [place] [address]    Report Status 04/02/2021 FINAL  Final  Blood Culture (routine x 2)     Status: Abnormal   Collection Time: 03/28/21  2:50 PM   Specimen: BLOOD  Result Value Ref Range Status   Specimen Description BLOOD LEFT ANTECUBITAL  Final   Special Requests   Final    BOTTLES DRAWN AEROBIC AND ANAEROBIC Blood Culture results may not be optimal due to an excessive volume of blood received in culture bottles   Culture  Setup Time   Final    GRAM POSITIVE COCCI IN CLUSTERS IN BOTH AEROBIC AND ANAEROBIC BOTTLES CRITICAL RESULT  CALLED TO, READ BACK BY AND VERIFIED WITH: PHARMD E.SINCLAIR AT 1255 ON 03/29/2021 BY T.SAAD.    Culture (A)  Final    STAPHYLOCOCCUS EPIDERMIDIS THE SIGNIFICANCE OF ISOLATING THIS ORGANISM FROM A SINGLE SET OF BLOOD CULTURES WHEN MULTIPLE SETS ARE DRAWN IS UNCERTAIN. PLEASE NOTIFY THE MICROBIOLOGY DEPARTMENT WITHIN ONE WEEK IF SPECIATION AND SENSITIVITIES ARE REQUIRED. Performed at [facility], [address], [place], [place] [address]    Report Status 03/31/2021 FINAL  Final  Blood Culture ID Panel (Reflexed)     Status: Abnormal    Collection Time: 03/28/21  2:50 PM  Result Value Ref Range Status   Enterococcus faecalis NOT DETECTED NOT DETECTED Final   Enterococcus Faecium NOT DETECTED NOT DETECTED Final   Listeria monocytogenes NOT DETECTED NOT DETECTED Final   Staphylococcus species DETECTED (A) NOT DETECTED Final    Comment: CRITICAL RESULT CALLED TO, READ BACK BY AND VERIFIED WITH: PHARMD E.SINCLAIR AT 1255 ON 03/29/2021 BY T.SAAD.    Staphylococcus aureus (BCID) NOT DETECTED NOT DETECTED Final   Staphylococcus epidermidis DETECTED (A) NOT DETECTED Final    Comment: CRITICAL RESULT CALLED TO, READ BACK BY AND VERIFIED WITH: PHARMD E.SINCLAIR AT 1255 ON 03/29/2021 BY T.SAAD.    Staphylococcus lugdunensis NOT DETECTED NOT DETECTED Final   Streptococcus species NOT DETECTED NOT DETECTED Final   Streptococcus agalactiae NOT DETECTED NOT DETECTED Final   Streptococcus pneumoniae NOT DETECTED NOT DETECTED Final   Streptococcus pyogenes NOT DETECTED NOT DETECTED Final   A.calcoaceticus-baumannii NOT DETECTED NOT DETECTED Final   Bacteroides fragilis NOT DETECTED NOT DETECTED Final   Enterobacterales NOT DETECTED NOT DETECTED Final   Enterobacter cloacae complex NOT DETECTED NOT DETECTED Final   Escherichia coli NOT DETECTED NOT DETECTED Final   Klebsiella aerogenes NOT DETECTED NOT DETECTED Final   Klebsiella oxytoca NOT DETECTED NOT DETECTED Final   Klebsiella pneumoniae NOT DETECTED NOT DETECTED Final   Proteus species NOT DETECTED NOT DETECTED Final   Salmonella species NOT DETECTED NOT DETECTED Final   Serratia marcescens NOT DETECTED NOT DETECTED Final   Haemophilus influenzae NOT DETECTED NOT DETECTED Final   Neisseria meningitidis NOT DETECTED NOT DETECTED Final   Pseudomonas aeruginosa NOT DETECTED NOT DETECTED Final   Stenotrophomonas maltophilia NOT DETECTED NOT DETECTED Final   Candida albicans NOT DETECTED NOT DETECTED Final   Candida auris NOT DETECTED NOT DETECTED Final   Candida glabrata NOT  DETECTED NOT DETECTED Final   Candida krusei NOT DETECTED NOT DETECTED Final   Candida parapsilosis NOT DETECTED NOT DETECTED Final   Candida tropicalis NOT DETECTED NOT DETECTED Final   Cryptococcus neoformans/gattii NOT DETECTED NOT DETECTED Final   Methicillin resistance mecA/C NOT DETECTED NOT DETECTED Final    Comment: Performed at [facility], [address] [place], [place] [address]  VZV PCR, CSF     Status: None   Collection Time: 03/29/21  2:49 PM   Specimen: CSF; Cerebrospinal Fluid  Result Value Ref Range Status   VZV PCR, CSF Negative Negative Final    Comment: (NOTE) No Varicella Zoster Virus DNA detected. Performed At: [facility] [address] [place], [place] [id number] Jolene Schimke MD WY:[id number]   Fungus Culture With Stain     Status: None (Preliminary result)   Collection Time: 03/29/21  2:49 PM  Result Value Ref Range Status   Fungus Stain Final report  Final    Comment: (NOTE) Performed At: [facility] [address] [place], [place] [id number] Jolene Schimke  MD BJ:[id number]    Fungus (Mycology) Culture PENDING  Incomplete   Fungal Source PENDING  Incomplete  Fungus Culture Result     Status: None   Collection Time: 03/29/21  2:49 PM  Result Value Ref Range Status   Result 1 Comment  Final    Comment: (NOTE) KOH/Calcofluor preparation:  no fungus observed. Performed At: [facility] [address] [place], [place] [id number] Jolene Schimke MD IR:[id number]   CSF culture w Stat Gram Stain     Status: None   Collection Time: 03/29/21  2:53 PM   Specimen: CSF; Cerebrospinal Fluid  Result Value Ref Range Status   Specimen Description CSF  Final   Special Requests NONE  Final   Gram Stain   Final    WBC PRESENT,BOTH PMN AND MONONUCLEAR NO ORGANISMS SEEN CYTOSPIN SMEAR    Culture   Final    NO GROWTH 3 DAYS Performed at [facility], [address] [place], [place] [address]    Report Status  04/02/2021 FINAL  Final  Resp Panel by RT-PCR (Flu A&B, Covid) Nasopharyngeal Swab     Status: None   Collection Time: 03/29/21  3:12 PM   Specimen: Nasopharyngeal Swab; Nasopharyngeal(NP) swabs in vial transport medium  Result Value Ref Range Status   SARS Coronavirus 2 by RT PCR NEGATIVE NEGATIVE Final    Comment: (NOTE) SARS-CoV-2 target nucleic acids are NOT DETECTED.  The SARS-CoV-2 RNA is generally detectable in upper respiratory specimens during the acute phase of infection. The lowest concentration of SARS-CoV-2 viral copies this assay can detect is 138 copies/mL. A negative result does not preclude SARS-Cov-2 infection and should not be used as the sole basis for treatment or other patient management decisions. A negative result may occur with  improper specimen collection/handling, submission of specimen other than nasopharyngeal swab, presence of viral mutation(s) within the areas targeted by this assay, and inadequate number of viral copies(<138 copies/mL). A negative result must be combined with clinical observations, patient history, and epidemiological information. The expected result is Negative.  Fact Sheet for Patients:  BloggerCourse.com  Fact Sheet for Healthcare Providers:  SeriousBroker.it  This test is no t yet approved or cleared by the [place] FDA and  has been authorized for detection and/or diagnosis of SARS-CoV-2 by FDA under an Emergency Use Authorization (EUA). This EUA will remain  in effect (meaning this test can be used) for the duration of the COVID-19 declaration under Section 564(b)(1) of the Act, 21 U.S.C.section 360bbb-3(b)(1), unless the authorization is terminated  or revoked sooner.       Influenza A by PCR NEGATIVE NEGATIVE Final   Influenza B by PCR NEGATIVE NEGATIVE Final    Comment: (NOTE) The Xpert Xpress SARS-CoV-2/FLU/RSV plus assay is intended as an aid in the diagnosis of  influenza from Nasopharyngeal swab specimens and should not be used as a sole basis for treatment. Nasal washings and aspirates are unacceptable for Xpert Xpress SARS-CoV-2/FLU/RSV testing.  Fact Sheet for Patients: BloggerCourse.com  Fact Sheet for Healthcare Providers: SeriousBroker.it  This test is not yet approved or cleared by the [place] FDA and has been authorized for detection and/or diagnosis of SARS-CoV-2 by FDA under an Emergency Use Authorization (EUA). This EUA will remain in effect (meaning this test can be used) for the duration of the COVID-19 declaration under Section 564(b)(1) of the Act, 21 U.S.C. section 360bbb-3(b)(1), unless the authorization is terminated or revoked.  Performed at [facility], [address], [place], [place] [address]      Medications:    Chlorhexidine Gluconate Cloth  6 each Topical Daily   enoxaparin (LOVENOX) injection  40 mg Subcutaneous Q24H   levETIRAcetam  500 mg Oral BID   polyethylene glycol  17 g Oral BID   tamsulosin  0.4 mg Oral Daily   Continuous Infusions:    LOS: 6 days   Marinda Elk  [facility]  04/04/2021, 10:26 AM

## 2021-04-04 NOTE — Progress Notes (Signed)
IP rehab admissions - I met with patient and his wife.  I also spoke with unit case manager, Kelli.  All are interested in CIR here at [facility].  I will open the case and send information to [employer] to request acute inpatient rehab admission.  I will follow as needed.  Call for questions.  #[phone] 

## 2021-04-04 NOTE — Progress Notes (Signed)
Physical Therapy Treatment Patient Details Name: James Obrien MRN: [id number] DOB: 03-08-1986 Today's Date: 04/04/2021   History of Present Illness 35 y.o. male with a PMHx of genitourinary lesions, groin erythema, and tobacco use who presented with multiple seizures described initially as left hand pins-and-needles sensation, left hand shaking, left eye twitching, progressing into GTC seizure lasting 2-5 minutes s/p Versed.  Patient was febrile at 103.72F on 9/30 and was started on empiric acyclovir pending HSV PCR. MRI positive for extensive myelitis.    PT Comments    Pt received in supine, agreeable to therapy session and with good participation and tolerance for seated balance training and lateral seated scooting to drop arm recliner. Pt performed bed mobility and transfer with up to +2  maxA, although pt reports today he felt he was using BUE more with transfer to chair which was promising to him. Pt with decreased trunk control when seated and needs min guard to minA for seated balance at all times. Pt reports some sensation in B ankles with prolonged heel cord stretch. Pt with some increased discomfort with EOB seated posture after getting miralax and spouse reports still no bowel movement in 7 days, RN aware pt may need instruction on bowel prep/program. Pt continues to benefit from PT services to progress toward functional mobility goals.   Recommendations for follow up therapy are one component of a multi-disciplinary discharge planning process, led by the attending physician.  Recommendations may be updated based on patient status, additional functional criteria and insurance authorization.  Follow Up Recommendations  CIR     Equipment Recommendations  Wheelchair cushion (measurements PT);Wheelchair (measurements PT);3in1 (PT) (hoyer lift, 3in1; may consider drop arm for 3in1 and slide board pending progress)    Recommendations for Other Services       Precautions /  Restrictions Precautions Precautions: Fall Precaution Comments: paraparesis; check BP Required Braces or Orthoses: Other Brace Other Brace: ace wraps for LE's Restrictions Weight Bearing Restrictions: No     Mobility  Bed Mobility Overal bed mobility: Needs Assistance Bed Mobility: Rolling;Sidelying to Sit Rolling: Mod assist Sidelying to sit: Mod assist;HOB elevated;+2 for safety/equipment  General bed mobility comments: pt requires assistance for movement of LEs, able to utilize UE strength to elevate trunk into sitting. Pt with posterior loss of balance during initial attempt due to core weakness so second person guarding for safety    Transfers Overall transfer level: Needs assistance Equipment used: None Transfers: Lateral/Scoot Transfers;Sit to/from Stand Sit to Stand: Total assist    Lateral/Scoot Transfers: Max assist;+2 safety/equipment General transfer comment: Cues for anterior trunk lean and hand placement. PT utilizing bed pad to maintain anterior trunk lean and prevent posterior loss of balance due to impaired core strength, +2 for safety this session and totalA for BLE positioning while scooting. Pt performs partial sit to stand with PT knee block and BUE support of PT, aiding repositioning of bed pad.         Balance Overall balance assessment: Needs assistance Sitting-balance support: Bilateral upper extremity supported Sitting balance-Leahy Scale: Poor Sitting balance - Comments: pt reliant on BUE support of bed, multiple rightward and posterior losses of balance at edge of bed with removal of UE support Postural control: Posterior lean;Right lateral lean Standing balance support: Bilateral upper extremity supported Standing balance-Leahy Scale: Zero Standing balance comment: requires knee block and totalA for partial stand x2 reps for pad repositioning  Cognition Arousal/Alertness: Awake/alert Behavior During Therapy:  WFL for tasks assessed/performed;Flat affect Overall Cognitive Status: Within Functional Limits for tasks assessed                                 General Comments: Pt at times slow processing with word choice when seated EOB, although pt admits dizziness which may be distracting. Pt calm/cooperative.      Exercises Other Exercises Other Exercises: Heel cord stretch with demonstration 30 sec each side, PROM ankle pumps x10 reps ea Other Exercises: Extensive instruction on pressure relief strategies and frequency in bed/chair due to decreased session below navel and risk of skin breakdown at bony prominences. Demonstrated seated pressure relief positions x3 with pt/spouse via teachback method.    General Comments General comments (skin integrity, edema, etc.): BP 110/62 (74) seated EOB (BLE ace wrapped); HR 63 bpm resting and WFL with mobility      Pertinent Vitals/Pain Pain Assessment: No/denies pain Pain Intervention(s): Monitored during session    Home Living                      Prior Function            PT Goals (current goals can now be found in the care plan section) Acute Rehab PT Goals Patient Stated Goal: to go outside in a wheelchair PT Goal Formulation: With patient/family Time For Goal Achievement: 04/15/21 Progress towards PT goals: Progressing toward goals    Frequency    Min 4X/week      PT Plan Current plan remains appropriate    Co-evaluation              AM-PAC PT "6 Clicks" Mobility   Outcome Measure  Help needed turning from your back to your side while in a flat bed without using bedrails?: A Lot Help needed moving from lying on your back to sitting on the side of a flat bed without using bedrails?: A Lot Help needed moving to and from a bed to a chair (including a wheelchair)?: A Lot Help needed standing up from a chair using your arms (e.g., wheelchair or bedside chair)?: Total Help needed to walk in hospital  room?: Total Help needed climbing 3-5 steps with a railing? : Total 6 Click Score: 9    End of Session   Activity Tolerance: Patient tolerated treatment well Patient left: in chair;with call bell/phone within reach;with family/visitor present;with chair alarm set;Other (comment) (heels floated) Nurse Communication: Mobility status;Need for lift equipment (lift or lateral transfer from flat recliner to bed with drawsheet) PT Visit Diagnosis: Other abnormalities of gait and mobility (R26.89);Muscle weakness (generalized) (M62.81);Other (comment)     Time: 4098-1191 PT Time Calculation (min) (ACUTE ONLY): 30 min  Charges:  $Therapeutic Activity: 23-37 mins                     James Gathright P., PTA Acute Rehabilitation Services Pager: [phone] Office: [phone]    Angus [place]ms 04/04/2021, 4:46 PM

## 2021-04-04 NOTE — Progress Notes (Signed)
Pt transferred from recliner to bed with rail in drop down, 1 assist. Able to mobilize upper body, with assistance in moving bil LE.

## 2021-04-05 MED ORDER — IBUPROFEN 600 MG PO TABS: 600.0000 mg | ORAL_TABLET | Freq: Four times a day (QID) | ORAL | 0 refills | Status: DC | PRN

## 2021-04-05 MED ORDER — TAMSULOSIN HCL 0.4 MG PO CAPS: 0.4000 mg | ORAL_CAPSULE | Freq: Every day | ORAL | Status: DC

## 2021-04-05 MED ORDER — POLYETHYLENE GLYCOL 3350 17 G PO PACK
17.0000 g | PACK | Freq: Two times a day (BID) | ORAL | Status: DC
  Administered 2021-04-05 – 2021-04-06 (×2): 17 g via ORAL
  Filled 2021-04-05 (×2): qty 1

## 2021-04-05 MED ORDER — LEVETIRACETAM 500 MG PO TABS: 500.0000 mg | ORAL_TABLET | Freq: Two times a day (BID) | ORAL | Status: DC

## 2021-04-05 NOTE — H&P (Signed)
Physical Medicine and Rehabilitation Admission H&P    Chief Complaint  Patient presents with   Seizures  : HPI: James Obrien is a 35 year old right-handed male with unremarkable past medical history except for recent penile and scrotal lesions thought to be due to a spider bite as well as tobacco use on no prescription medications.  Per chart review patient lives with spouse.  1 level home 3 steps to entry.  3 children ages 12/8/5.  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  Presented 03/28/2021 with tingling and numbness of the left hand which quickly progressed to the lower extremities as well as a witnessed generalized tonic-clonic seizure that lasted 2-5 minutes.  Patient was loaded with Keppra.  EEG showed no seizure.  Cranial CT scan negative.  MRI showed multifocal abnormal hyperintense T2 weighted signal within both hemispheres but greatest at the right pre and postcentral gyri.  Appearance was concerning for acute demyelination.  MRI thoracic lumbar spine longitudinally extensive cord signal abnormality/myelitis.  No hematoma or other complications.  CT of the chest abdomen pelvis showed subcutaneous fat stranding left lower quadrant anterior abdominal wall and left inguinal region consistent with cellulitis.  No fluid collection or abscess.  Reactive lymphadenopathy within the pelvis and bilateral inguinal regions.  Admission chemistries unremarkable except creatinine 1.27, urine cultures no growth, blood cultures no growth to date, lactic acid 4.1, HIV antibody nonreactive, RPR nonreactive, GC/chlamydia probe positive for gonorrhea..  Neurology consulted lumbar puncture showed evidence of pleocytosis and CBC with elevated WBCC.  Consistent with myelitis, encephalitis.  Patient was initially placed on empiric acyclovir for possible encephalitis discontinued 10/4 with negative HSV.  ID did see the patient and bacterial antibiotics were added after neutrophilic CSF was seen in  addition to blood cultures positive for methicillin sensitive Staphylococcus epidermidis.  He was treated with a lower dose of steroids to help reduce potential spinal cord swelling.  Patient's antibiotic course has been completed.  Hospital course patient did have some bradycardia with cardiology follow-up and echocardiogram completed with ejection fraction of 55 to 60% no wall motion abnormality and no further work-up was indicated.  Patient bouts of urinary retention and failed voiding trial with Foley tube in place and Flomax initiated.  Lovenox added for DVT prophylaxis.  Therapy evaluations completed due to patient decreased functional mobility was admitted for a comprehensive rehab program.  Review of Systems  Constitutional:  Negative for chills and fever.  HENT:  Negative for hearing loss.   Eyes:  Negative for blurred vision and double vision.  Respiratory:  Negative for cough and shortness of breath.   Cardiovascular:  Negative for chest pain, palpitations and leg swelling.  Gastrointestinal:  Positive for constipation. Negative for heartburn, nausea and vomiting.  Genitourinary:  Negative for dysuria, flank pain and hematuria.  Musculoskeletal:  Positive for joint pain and myalgias.  Skin:  Negative for rash.  Neurological:  Positive for sensory change, seizures and weakness.  All other systems reviewed and are negative. Past Medical History:  Diagnosis Date   Medical history non-contributory    Past Surgical History:  Procedure Laterality Date   NO PAST SURGERIES     Family History  Problem Relation Age of Onset   Seizures Neg Hx    Social History:  reports that he has been smoking cigarettes. He has never used smokeless tobacco. He reports current alcohol use. He reports that he does not use drugs. Allergies: No Known Allergies Medications Prior to Admission  Medication Sig  Dispense Refill   [EXPIRED] doxycycline (VIBRA-TABS) 100 MG tablet Take 1 tablet (100 mg total) by  mouth 2 (two) times daily for 7 days. 14 tablet 0   TYLENOL 500 MG tablet Take 500-1,000 mg by mouth every 6 (six) hours as needed (FOR HEADACHES).      Drug Regimen Review Drug regimen was reviewed and remains appropriate with no significant issues identified  Home: Home Living Family/patient expects to be discharged to:: Private residence Living Arrangements: Spouse/significant other Available Help at Discharge: Family, Available 24 hours/day Type of Home: Mobile home Home Access: Stairs to enter [employer] of Steps: 3 Entrance Stairs-Rails:  (grabbar at top and freezer beside stairs) Home Layout: One level Bathroom Shower/Tub: Engineer, manufacturing systems: Standard Bathroom Accessibility: No Home Equipment: None Additional Comments: 3 children (12/8/5 and wife homeschools)  Lives With: Spouse   Functional History: Prior Function Level of Independence: Independent 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Catering manager Status:  Mobility: Bed Mobility Overal bed mobility: Needs Assistance Bed Mobility: Rolling, Sidelying to Sit Rolling: Mod assist Sidelying to sit: Mod assist, HOB elevated, +2 for safety/equipment Supine to sit: Max assist, HOB elevated Sit to supine: Max assist, +2 for safety/equipment, +2 for physical assistance General bed mobility comments: pt requires assistance for movement of LEs, able to utilize UE strength to elevate trunk into sitting. Pt with posterior loss of balance during initial attempt due to core weakness so second person guarding for safety Transfers Overall transfer level: Needs assistance Equipment used: None Transfers: Lateral/Scoot Transfers, Sit to/from Stand Sit to Stand: Total assist Stand pivot transfers: Total assist  Lateral/Scoot Transfers: Max assist, +2 safety/equipment General transfer comment: Cues for anterior trunk lean and hand placement. PT utilizing bed pad to maintain anterior trunk lean and prevent posterior  loss of balance due to impaired core strength, +2 for safety this session and totalA for BLE positioning while scooting. Pt performs partial sit to stand with PT knee block and BUE support of PT, aiding repositioning of bed pad.      ADL: ADL Overall ADL's : Needs assistance/impaired Eating/Feeding: Set up Eating/Feeding Details (indicate cue type and reason): assistance with packaging - pt is LHD, but able to self feed with R hand. May benefit from built up handle Grooming: Set up, Sitting Upper Body Bathing: Maximal assistance, Sitting Upper Body Bathing Details (indicate cue type and reason): assist for sitting balance and bathing Lower Body Bathing: Maximal assistance, Sitting/lateral leans, +2 for physical assistance, +2 for safety/equipment Upper Body Dressing : Maximal assistance, Sitting Lower Body Dressing: Total assistance, Sitting/lateral leans, +2 for physical assistance, +2 for safety/equipment Toilet Transfer: Total assistance, +2 for physical assistance, +2 for safety/equipment Toileting- Clothing Manipulation and Hygiene: Maximal assistance, Sitting/lateral lean Functional mobility during ADLs: +2 for physical assistance, +2 for safety/equipment General ADL Comments: assist for balance, paraparesis, impaired function of LUE and BLE  Cognition: Cognition Overall Cognitive Status: Within Functional Limits for tasks assessed Orientation Level: Oriented X4 Cognition Arousal/Alertness: Awake/alert Behavior During Therapy: WFL for tasks assessed/performed, Flat affect Overall Cognitive Status: Within Functional Limits for tasks assessed General Comments: Pt at times slow processing with word choice when seated EOB, although pt admits dizziness which may be distracting. Pt calm/cooperative.  Physical Exam: Blood pressure 115/77, pulse (!) 57, temperature 98 F (36.7 C), temperature source Oral, resp. rate 16, height 5\' 11"  (1.803 m), weight 74.8 kg, SpO2 99 %. Physical  Exam Constitutional:      General: He is not in acute distress.  Appearance: He is normal weight.  HENT:     Head: Normocephalic and atraumatic.     Nose: Nose normal.  Eyes:     Pupils: Pupils are equal, round, and reactive to light.  Cardiovascular:     Rate and Rhythm: Regular rhythm. Bradycardia present.     Heart sounds: No murmur heard.   No gallop.  Pulmonary:     Effort: Pulmonary effort is normal. No respiratory distress.     Breath sounds: No wheezing.  Abdominal:     General: There is no distension.     Palpations: Abdomen is soft.     Tenderness: There is no abdominal tenderness.  Musculoskeletal:        General: Normal range of motion.     Cervical back: Normal range of motion.  Skin:    General: Skin is warm.     Comments: Numerous tattoos. Quarter sized ulcer left heel  Neurological:     Mental Status: He is alert.     Comments: Patient is alert.  No acute distress and follows commands.  Makes eye contact with examiner.  Oriented to person place and time with fair awareness and insight. Dimished LT and pain around T5-6. Sensation more diminished in legs. UE motor 4+/5. LLE 1+ HE, KE, 1/5 APF, 0/5 ADF. RLE 0/5 prox to distal. No resting tone. DTR's trace to absent  Psychiatric:        Behavior: Behavior normal.        Thought Content: Thought content normal.        Judgment: Judgment normal.    No results found for this or any previous visit (from the past 48 hour(s)). No results found.     Medical Problem List and Plan: 1.   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  Antibiotic course completed..  Follow-up with ID as outpatient with immunologic work-up and viral serology  -patient may  shower  -ELOS/Goals: 21-24 days, min assist at w/c level 2.  Antithrombotics: -DVT/anticoagulation:  Pharmaceutical: Lovenox.  Check vascular study  -antiplatelet therapy: N/A 3. Pain Management: Tylenol/Advil as needed 4. Mood: Provide  emotional support  -antipsychotic agents: N/A 5. Neuropsych: This patient is capable of making decisions on his own behalf. 6. Skin/Wound Care: Routine skin checks  -needs to wear PRAFO's for heel protection 7. Fluids/Electrolytes/Nutrition: Routine in and outs with follow-up chemistries 8.  Seizure prophylaxis.  Keppra 500 mg twice daily 9.  Sinus bradycardia.  Echocardiogram unremarkable.  No further work-up indicated per cardiology services 10.  Neurogenic bladder/Urinary retention.  Failed voiding trial.    -continue foley -Continue Flomax.  Repeat voiding trial as mobility improves 11. Neurogenic bowel: begin scheduled senokot-s at HS.  -consider bowel program  -pt reports no sense of bowel emptying right now.    Mcarthur Rossetti Angiulli, PA-C 04/05/2021

## 2021-04-05 NOTE — Discharge Summary (Signed)
Physician Discharge Summary  James Obrien AYT:[id number] DOB: 1985/07/21 DOA: 03/28/2021  PCP: Pcp, No  Admit date: 03/28/2021 Discharge date: 04/05/2021  Admitted From: Home Disposition:  CIR  Recommendations for Outpatient Follow-up:  Please obtain BMP/CBC in one week He will go to CIR. will follow up ID as an outpatient follow-up with immunologic work-up and viral serology  Home Health:no Equipment/Devices:none  Discharge Condition:Stable CODE STATUS:Full Diet recommendation: Heart Healthy   Brief/Interim Summary: 35 y.o. male no significant past medical history brought into the ED for generalized tonic clonic seizures, patient was also complaining of left groin area that had a and some discharge from his penis recently treated for gonorrhea in the ED was found to be febrile, blood cultures were ordered CT of the head showed no acute findings MRI of the brain showed abnormal hyperintensity and T2 in both hemispheres concerning for acute demyelinating disease, and underwent MRI of the brain with contrast that favor demyelinating disease.  CT scan of the abdomen and pelvis in the left groin area shows cellulitis and inguinal lymphadenopathy. He was seen by neurologist underwent lumbar puncture and meningitis treatment.  Discharge Diagnoses:  Principal Problem:   Seizure [facility]) Active Problems:   Gonorrhea   Cellulitis   Bradycardia  Longitudinal demyelinating spine disease/paraplegia/seizure: He was started on Ativan and IV Keppra as seizure has now been controlled. Underwent extensive evaluation, neurology and ID were consulted. EEG showed cortical dysfunction of the left hemisphere nonspecific. HSV was negative he was started initially on IV antibiotics and acyclovir for possible meningitis for which she completed treatment. Once culture data and serology came back treatment will stop. Infectious disease in food possibilities like infectious encephalitis likely due to [place] virus versus inflammatory encephalitis. His mentation has significantly improved but he remains with lower extremity weakness for which he was started on steroids which she completed 3-day course with his last day on 04/01/2021. Therapy evaluated the patient and recommended CIR for which was excepted. Inflammatory markers for autoimmune are pending and will follow-up as an outpatient  Sinus bradycardia: Echo was unremarkable in the ED need initially atropine cardiology was consulted but they recommended no further work-up.  Mild leukocytosis: Likely due to steroids has remained afebrile with.  Urinary retention: He was started on Flomax, he failed voiding trial we will try again a voiding trial in 1 to 2 weeks. Please exchange the Foley in 30 days.  Discharge Instructions  Discharge Instructions     Diet - low sodium heart healthy   Complete by: As directed    Increase activity slowly   Complete by: As directed       Allergies as of 04/05/2021   No Known Allergies      Medication List     STOP taking these medications    doxycycline 100 MG tablet Commonly known as: VIBRA-TABS       TAKE these medications    ibuprofen 600 MG tablet Commonly known as: ADVIL Take 1 tablet (600 mg total) by mouth every 6 (six) hours as needed for fever, mild pain or headache.   levETIRAcetam 500 MG tablet Commonly known as: KEPPRA Take 1 tablet (500 mg total) by mouth 2 (two) times daily.   tamsulosin 0.4 MG Caps capsule Commonly known as: FLOMAX Take 1 capsule (0.4 mg total) by mouth daily.   TYLENOL 500 MG tablet Generic drug: acetaminophen Take 500-1,000 mg by mouth every 6 (six) hours as needed (FOR HEADACHES).  No Known Allergies  Consultations: Infectious disease Neurology   Procedures/Studies: DG Chest 1 View  Addendum Date: 03/28/2021   ADDENDUM REPORT: 03/28/2021 15:57 Electronically Signed   By: Jasmine Pang M.D.   On: 03/28/2021 15:57    Result Date: 03/28/2021 CLINICAL DATA:  Seizure and fell out of chair EXAM: CHEST  1 VIEW COMPARISON:  None. FINDINGS: No focal opacity or pleural effusion. Borderline cardiomegaly. No pneumothorax. IMPRESSION: Borderline cardiomegaly without edema or focal airspace disease. Electronically Signed: By: Jasmine Pang M.D. On: 03/28/2021 15:55   CT Head Wo Contrast  Result Date: 03/28/2021 CLINICAL DATA:  Seizure. EXAM: CT HEAD WITHOUT CONTRAST TECHNIQUE: Contiguous axial images were obtained from the base of the skull through the vertex without intravenous contrast. COMPARISON:  None. FINDINGS: Brain: No evidence of acute infarction, hemorrhage, hydrocephalus, extra-axial collection or mass lesion/mass effect. Vascular: No hyperdense vessel or unexpected calcification. Skull: Normal. Negative for fracture or focal lesion. Sinuses/Orbits: Air-fluid level noted in the left maxillary sinus. Other: None. IMPRESSION: 1. No acute intracranial abnormalities. 2. Air-fluid level within the left maxillary sinus. Electronically Signed   By: Signa Kell M.D.   On: 03/28/2021 15:49   MR BRAIN WO CONTRAST  Result Date: 03/28/2021 CLINICAL DATA:  Seizure EXAM: MRI HEAD WITHOUT CONTRAST TECHNIQUE: Multiplanar, multiecho pulse sequences of the brain and surrounding structures were obtained without intravenous contrast. COMPARISON:  None. FINDINGS: Brain: There is no acute hemorrhage or acute infarct. There is an area of irregular hyperintense T2-weighted signal that involves the precentral and postcentral gyri. There are other scattered foci of hyperintense T2-weighted signal, many of which have an abnormal, rounded morphology. Vascular: Major flow voids are preserved. Skull and upper cervical spine: Normal calvarium and skull base. Visualized upper cervical spine and soft tissues are normal. Sinuses/Orbits:Mucosal thickening in the maxillary sinuses. No mastoid or middle ear effusion. Normal orbits. IMPRESSION: 1.  Multifocal abnormal hyperintense T2-weighted signal within both hemispheres, but greatest at the right pre and postcentral gyri. The appearance is concerning for acute demyelination. Lymphoma is a secondary possibility. Postcontrast imaging is recommended as an adjunct to this study, for further evaluation. Electronically Signed   By: Deatra Robinson M.D.   On: 03/28/2021 22:54   MR BRAIN W CONTRAST  Result Date: 03/29/2021 CLINICAL DATA:  Brain mass or lesion EXAM: MRI HEAD WITH CONTRAST TECHNIQUE: Multiplanar, multiecho pulse sequences of the brain and surrounding structures were obtained with intravenous contrast. CONTRAST:  104mL GADAVIST GADOBUTROL 1 MMOL/ML IV SOLN COMPARISON:  Noncontrast brain MRI from earlier today FINDINGS: Motion degraded study. Some of the patient's lesions show faint amorphous enhancement, including in the right frontal parietal cortex and juxtacortical region, the left occipital cortex, in indistinctly in the pons. Major vessels are enhancing. 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 Negative orbits and skull. There is chart history of lesions involving the genitals. Neuro Bechet's is a leading consideration especially given the pontine involvement. Other inflammatory processes such as ADEM or sarcoid are considered. An infiltrating neoplasm such as lymphoma is a consideration, but usually much more avidly enhancing. Need follow-up. IMPRESSION: Faint enhancement is associated with the T2 hyperintense lesions, an inflammatory/demyelinating process is again favored. Given pontine involvement and chart history of genitourinary lesions, specifically consider Bechet's. Further discussion above. Electronically Signed   By: Tiburcio Pea M.D.   On: 03/29/2021 04:28   MR THORACIC SPINE WO CONTRAST  Result Date: 03/30/2021 CLINICAL DATA:  Recent LP, evaluate for epidural collection. EXAM:  MRI THORACIC AND LUMBAR SPINE WITHOUT CONTRAST TECHNIQUE: Multiplanar and multiecho pulse sequences of the thoracic and lumbar spine were obtained without intravenous contrast. COMPARISON:  None. FINDINGS: MRI THORACIC SPINE FINDINGS Alignment:  Normal Vertebrae: No fracture, evidence of discitis, or bone lesion. No evidence of bone infarct Cord: Diffuse T2 hyperintensity in the cord, primarily central with mild swollen appearance. There is likely cervical involvement as well, but partially covered. Paraspinal and other soft tissues: No evidence of perispinal mass or inflammation. Disc levels: Small disc protrusions at T7-8 and T8-9 which contact but do not compress the cord. MRI LUMBAR SPINE FINDINGS Segmentation:  5 lumbar type vertebrae Alignment:  Normal Vertebrae:  No fracture, evidence of discitis, or bone lesion. Conus medullaris and cauda equina: Conus extends to the L1 level. Mild T2 hyperintensity and swelling at the level of the conus. No cauda equina thickening, nodularity, or distortion. Paraspinal and other soft tissues: Interspinous T2 hyperintensity at L4-5 which is likely from the lumbar puncture. No collection. Disc levels: Well preserved disc height and hydration. Negative facets. No neural impingement. IMPRESSION: 1. Longitudinally extensive cord signal abnormality/myelitis. 2. No hematoma or other complication related to lumbar puncture. Electronically Signed   By: Tiburcio Pea M.D.   On: 03/30/2021 11:11   MR LUMBAR SPINE WO CONTRAST  Result Date: 03/30/2021 CLINICAL DATA:  Recent LP, evaluate for epidural collection. EXAM: MRI THORACIC AND LUMBAR SPINE WITHOUT CONTRAST TECHNIQUE: Multiplanar and multiecho pulse sequences of the thoracic and lumbar spine were obtained without intravenous contrast. COMPARISON:  None. FINDINGS: MRI THORACIC SPINE FINDINGS Alignment:  Normal Vertebrae: No fracture, evidence of discitis, or bone lesion. No evidence of bone infarct Cord: Diffuse T2  hyperintensity in the cord, primarily central with mild swollen appearance. There is likely cervical involvement as well, but partially covered. Paraspinal and other soft tissues: No evidence of perispinal mass or inflammation. Disc levels: Small disc protrusions at T7-8 and T8-9 which contact but do not compress the cord. MRI LUMBAR SPINE FINDINGS Segmentation:  5 lumbar type vertebrae Alignment:  Normal Vertebrae:  No fracture, evidence of discitis, or bone lesion. Conus medullaris and cauda equina: Conus extends to the L1 level. Mild T2 hyperintensity and swelling at the level of the conus. No cauda equina thickening, nodularity, or distortion. Paraspinal and other soft tissues: Interspinous T2 hyperintensity at L4-5 which is likely from the lumbar puncture. No collection. Disc levels: Well preserved disc height and hydration. Negative facets. No neural impingement. IMPRESSION: 1. Longitudinally extensive cord signal abnormality/myelitis. 2. No hematoma or other complication related to lumbar puncture. Electronically Signed   By: Tiburcio Pea M.D.   On: 03/30/2021 11:11   CT Abdomen Pelvis W Contrast  Result Date: 03/28/2021 CLINICAL DATA:  Left groin pain. EXAM: CT ABDOMEN AND PELVIS WITH CONTRAST TECHNIQUE: Multidetector CT imaging of the abdomen and pelvis was performed using the standard protocol following bolus administration of intravenous contrast. CONTRAST:  OMNIPAQUE IOHEXOL 300 MG/ML  SOLN COMPARISON:  CT scan 03/25/2021 FINDINGS: Lower chest: The lung bases are clear of acute process. No pleural effusion or pulmonary lesions. The heart is normal in size. No pericardial effusion. The distal esophagus and aorta are unremarkable. Hepatobiliary: No hepatic lesions or intrahepatic biliary dilatation. The gallbladder is contracted. No common bile duct dilatation. Pancreas: No mass, inflammation or ductal dilatation. Spleen: Normal size.  No focal lesions. Adrenals/Urinary Tract: Adrenal glands  and kidneys are. The bladder is normal. Stomach/Bowel: The stomach,  duodenum, small bowel and colon are unremarkable. The terminal ileum and appendix are normal. Vascular/Lymphatic: The aorta is normal in caliber. No dissection. The branch vessels are patent. The major venous structures are patent. No mesenteric or retroperitoneal mass or adenopathy. Small scattered lymph nodes are noted. Reproductive: The prostate gland and seminal vesicles are unremarkable. Other: Similar to the prior CT scan there are enlarged pelvic and inguinal lymph nodes. Some improvement when compared to the prior study suggesting these are inflammatory/reactive. The left groin inflammatory process/cellulitis is much improved. No abscess. Musculoskeletal: No significant bony findings. IMPRESSION: 1. No acute abdominal/pelvic findings. 2. Improved/largely resolved left groin inflammatory process/cellulitis. 3. Persistent but slightly improved pelvic and inguinal lymphadenopathy, likely inflammatory/reactive. Electronically Signed   By: Rudie Meyer M.D.   On: 03/28/2021 16:00   CT Abdomen Pelvis W Contrast  Result Date: 03/25/2021 CLINICAL DATA:  Inguinal swelling, abscess EXAM: CT ABDOMEN AND PELVIS WITH CONTRAST TECHNIQUE: Multidetector CT imaging of the abdomen and pelvis was performed using the standard protocol following bolus administration of intravenous contrast. CONTRAST:  85mL OMNIPAQUE IOHEXOL 350 MG/ML SOLN COMPARISON:  None. FINDINGS: Lower chest: No acute pleural or parenchymal lung disease. Hepatobiliary: No focal liver abnormality is seen. No gallstones, gallbladder wall thickening, or biliary dilatation. Pancreas: Unremarkable. No pancreatic ductal dilatation or surrounding inflammatory changes. Spleen: Normal in size without focal abnormality. Adrenals/Urinary Tract: The kidneys enhance normally and symmetrically. No urinary tract calculi or obstructive uropathy. The bladder is decompressed, which limits evaluation. The  adrenals are unremarkable. Stomach/Bowel: No bowel obstruction or ileus. Normal appendix right lower quadrant. No bowel wall thickening or inflammatory change. Vascular/Lymphatic: No significant vascular findings. Lymphadenopathy is seen throughout the pelvis. Largest lymph node in the right external iliac chain measures 16 mm in short axis reference image 67/2. There are numerous enlarged bilateral inguinal lymph nodes, largest on the right measuring up to 13 mm in short axis reference image 76/2. These are likely reactive. Reproductive: Prostate is unremarkable. Other: There is subcutaneous fat stranding within the left lower anterior abdominal wall extending into the left inguinal region, consistent with cellulitis. There is no underlying fluid collection or abscess. No free intraperitoneal fluid or free gas. No abdominal wall hernia. Musculoskeletal: No acute or destructive bony lesions. Reconstructed images demonstrate no additional findings. IMPRESSION: 1. Subcutaneous fat stranding left lower quadrant anterior abdominal wall and left inguinal region, consistent with cellulitis. No fluid collection or abscess. 2. Reactive lymphadenopathy within the pelvis and bilateral inguinal regions as above. Electronically Signed   By: Sharlet Salina M.D.   On: 03/25/2021 19:05   DG CHEST PORT 1 VIEW  Result Date: 04/01/2021 CLINICAL DATA:  Left-sided chest pain EXAM: PORTABLE CHEST 1 VIEW COMPARISON:  03/28/2021 FINDINGS: The heart size and mediastinal contours are within normal limits. No focal airspace consolidation, pleural effusion, or pneumothorax. The visualized skeletal structures are unremarkable. IMPRESSION: No active disease. Electronically Signed   By: Duanne Guess D.O.   On: 04/01/2021 20:14   EEG adult  Result Date: 03/29/2021 Charlsie Quest, MD     03/29/2021  2:21 PM Patient Name: James Obrien MRN: [id number] Epilepsy Attending: Charlsie Quest Referring Physician/Provider:  Leda Gauze, NP Date: 03/29/2021 Duration: 22.38 mins Patient history: 35 y.o. male with PMH significant for everyday smoker who presents with multiple focal seizures with retained awareness and 2 focal seizures with secondary generalization. EEG to evaluate for seizure. Level of alertness: Awake, asleep AEDs during EEG study: LEV Technical aspects: This EEG  study was done with scalp electrodes positioned according to the 10-20 International system of electrode placement. Electrical activity was acquired at a sampling rate of 500Hz  and reviewed with a high frequency filter of 70Hz  and a low frequency filter of 1Hz . EEG data were recorded continuously and digitally stored. Description: The posterior dominant rhythm consists of 9 Hz activity of moderate voltage (25-35 uV) seen predominantly in posterior head regions, asymmetric ( left<right)  and reactive to eye opening and eye closing.Sleep was characterized by vertex waves, sleep spindles (12 to 14 Hz), maximal frontocentral region. EEG showed intermittent generalized and lateralized left hemisphere 3 to 6 Hz theta-delta slowing.  Hyperventilation and photic stimulation were not performed.   ABNORMALITY - Intermittent slow, generalized and lateralized left hemisphere - Background asymmetry, left<right IMPRESSION: This study is suggestive of cortical dysfunction arising from left hemisphere, nonspecific etiology but could be secondary to underlying structural abnormality. Additionally there is mild diffuse encephalopathy, nonspecific etiology. No seizures or epileptiform discharges were seen throughout the recording.   ECHOCARDIOGRAM COMPLETE  Result Date: 03/30/2021    ECHOCARDIOGRAM REPORT   Patient Name:   James Obrien Date of Exam: 03/30/2021 Medical Rec #:  05/30/2021           Height:       71.0 in Accession #:    [name]          Weight:       165.0 lb Date of Birth:  [birth year]            BSA:          1.943 m Patient Age:     35 years            BP:           121/64 mmHg Patient Gender: M                   HR:           79 bpm. Exam Location:  Inpatient Procedure: 2D Echo, Cardiac Doppler and Color Doppler Indications:    Other abnormalities of the heart  History:        Patient has no prior history of Echocardiogram examinations.                 Arrythmias:Bradycardia. Seizure.  Sonographer:    [id number] RDCS (AE) Referring Phys: [phone] THOMAS A KELLY IMPRESSIONS  1. Left ventricular ejection fraction, by estimation, is 55 to 60%. The left ventricle has normal function. The left ventricle has no regional wall motion abnormalities. Left ventricular diastolic parameters were normal.  2. Right ventricular systolic function is normal. The right ventricular size is normal. Tricuspid regurgitation signal is inadequate for assessing PA pressure.  3. There is a trivial pericardial effusion posterior to the left ventricle.  4. The mitral valve is grossly normal. Trivial mitral valve regurgitation.  5. The aortic valve is tricuspid. Aortic valve regurgitation is not visualized. No aortic stenosis is present. Aortic valve mean gradient measures 6.0 mmHg.  6. The inferior vena cava is normal in size with <50% respiratory variability, suggesting right atrial pressure of 8 mmHg. Comparison(s): No prior Echocardiogram. FINDINGS  Left Ventricle: Left ventricular ejection fraction, by estimation, is 55 to 60%. The left ventricle has normal function. The left ventricle has no regional wall motion abnormalities. The left ventricular internal cavity size was normal in size. There is  no left ventricular hypertrophy. Left ventricular diastolic parameters were normal.  Right Ventricle: The right ventricular size is normal. No increase in right ventricular wall thickness. Right ventricular systolic function is normal. Tricuspid regurgitation signal is inadequate for assessing PA pressure. Left Atrium: Left atrial size was normal in size. Right Atrium: Right  atrial size was normal in size. Pericardium: Trivial pericardial effusion is present. The pericardial effusion is posterior to the left ventricle. Mitral Valve: The mitral valve is grossly normal. Trivial mitral valve regurgitation. Tricuspid Valve: The tricuspid valve is grossly normal. Tricuspid valve regurgitation is trivial. Aortic Valve: The aortic valve is tricuspid. Aortic valve regurgitation is not visualized. No aortic stenosis is present. Aortic valve mean gradient measures 6.0 mmHg. Aortic valve peak gradient measures 14.1 mmHg. Aortic valve area, by VTI measures 3.00  cm. Pulmonic Valve: The pulmonic valve was grossly normal. Pulmonic valve regurgitation is trivial. Aorta: The aortic root is normal in size and structure. Venous: The inferior vena cava is normal in size with less than 50% respiratory variability, suggesting right atrial pressure of 8 mmHg. IAS/Shunts: No atrial level shunt detected by color flow Doppler.  LEFT VENTRICLE PLAX 2D LVIDd:         4.90 cm  Diastology LVIDs:         3.50 cm  LV e' medial:    17.60 cm/s LV PW:         1.00 cm  LV E/e' medial:  6.3 LV IVS:        0.80 cm  LV e' lateral:   21.90 cm/s LVOT diam:     2.10 cm  LV E/e' lateral: 5.1 LV SV:         94 LV SV Index:   48 LVOT Area:     3.46 cm  RIGHT VENTRICLE             IVC RV Basal diam:  3.20 cm     IVC diam: 2.00 cm RV S prime:     16.20 cm/s TAPSE (M-mode): 4.0 cm LEFT ATRIUM           Index       RIGHT ATRIUM           Index LA diam:      2.50 cm 1.29 cm/m  RA Area:     16.60 cm LA Vol (A2C): 39.9 ml 20.53 ml/m RA Volume:   38.60 ml  19.87 ml/m LA Vol (A4C): 37.2 ml 19.15 ml/m  AORTIC VALVE AV Area (Vmax):    2.86 cm AV Area (Vmean):   3.07 cm AV Area (VTI):     3.00 cm AV Vmax:           188.00 cm/s AV Vmean:          109.000 cm/s AV VTI:            0.312 m AV Peak Grad:      14.1 mmHg AV Mean Grad:      6.0 mmHg LVOT Vmax:         155.00 cm/s LVOT Vmean:        96.600 cm/s LVOT VTI:          0.270 m  LVOT/AV VTI ratio: 0.87  AORTA Ao Root diam: 3.30 cm Ao Asc diam:  2.90 cm MITRAL VALVE MV Area (PHT): 2.76 cm     SHUNTS MV Decel Time: 275 msec     Systemic VTI:  0.27 m MV E velocity: 111.00 cm/s  Systemic Diam: 2.10 cm MV A velocity: 43.30 cm/s  MV E/A ratio:  2.56 Nona Dell MD Electronically signed by Nona Dell MD Signature Date/Time: 03/30/2021/2:07:45 PM    Final    Subjective: No complaints  Discharge Exam: Vitals:   04/05/21 0312 04/05/21 0801  BP: 108/64 115/77  Pulse: (!) 57 (!) 57  Resp: 17 16  Temp: 97.7 F (36.5 C) 98 F (36.7 C)  SpO2: 95% 99%   Vitals:   04/04/21 2029 04/05/21 0044 04/05/21 0312 04/05/21 0801  BP: 115/64 110/71 108/64 115/77  Pulse: 69 (!) 58 (!) 57 (!) 57  Resp: 18 15 17 16   Temp: 98.2 F (36.8 C) 98.1 F (36.7 C) 97.7 F (36.5 C) 98 F (36.7 C)  TempSrc: Oral Oral Oral Oral  SpO2: 99% 98% 95% 99%  Weight:      Height:        General: Pt is alert, awake, not in acute distress Cardiovascular: RRR, S1/S2 +, no rubs, no gallops Respiratory: CTA bilaterally, no wheezing, no rhonchi Abdominal: Soft, NT, ND, bowel sounds + Extremities: no edema, no cyanosis    The results of significant diagnostics from this hospitalization (including imaging, microbiology, ancillary and laboratory) are listed below for reference.     Microbiology: Recent Results (from the past 240 hour(s))  Urine Culture     Status: None   Collection Time: 03/28/21  2:44 PM   Specimen: In/Out Cath Urine  Result Value Ref Range Status   Specimen Description IN/OUT CATH URINE  Final   Special Requests NONE  Final   Culture   Final    NO GROWTH Performed at [facility], [address], [place], [place] [address]    Report Status 03/29/2021 FINAL  Final  Blood Culture (routine x 2)     Status: None   Collection Time: 03/28/21  2:45 PM   Specimen: BLOOD  Result Value Ref Range Status   Specimen Description BLOOD RIGHT UPPER ARM  Final   Special  Requests   Final    BOTTLES DRAWN AEROBIC AND ANAEROBIC Blood Culture results may not be optimal due to an excessive volume of blood received in culture bottles   Culture   Final    NO GROWTH 5 DAYS Performed at [facility], [address], [place], [place] [address]    Report Status 04/02/2021 FINAL  Final  Blood Culture (routine x 2)     Status: Abnormal   Collection Time: 03/28/21  2:50 PM   Specimen: BLOOD  Result Value Ref Range Status   Specimen Description BLOOD LEFT ANTECUBITAL  Final   Special Requests   Final    BOTTLES DRAWN AEROBIC AND ANAEROBIC Blood Culture results may not be optimal due to an excessive volume of blood received in culture bottles   Culture  Setup Time   Final    GRAM POSITIVE COCCI IN CLUSTERS IN BOTH AEROBIC AND ANAEROBIC BOTTLES CRITICAL RESULT CALLED TO, READ BACK BY AND VERIFIED WITH: PHARMD E.SINCLAIR AT 1255 ON 03/29/2021 BY T.SAAD.    Culture (A)  Final    STAPHYLOCOCCUS EPIDERMIDIS THE SIGNIFICANCE OF ISOLATING THIS ORGANISM FROM A SINGLE SET OF BLOOD CULTURES WHEN MULTIPLE SETS ARE DRAWN IS UNCERTAIN. PLEASE NOTIFY THE MICROBIOLOGY DEPARTMENT WITHIN ONE WEEK IF SPECIATION AND SENSITIVITIES ARE REQUIRED. Performed at [facility], [address], [place], [place] [address]    Report Status 03/31/2021 FINAL  Final  Blood Culture ID Panel (Reflexed)     Status: Abnormal   Collection Time: 03/28/21  2:50 PM  Result Value Ref Range Status   Enterococcus faecalis NOT DETECTED NOT DETECTED Final   Enterococcus Faecium NOT DETECTED NOT DETECTED Final   Listeria monocytogenes NOT DETECTED NOT DETECTED Final   Staphylococcus species DETECTED (A) NOT DETECTED Final    Comment: CRITICAL RESULT CALLED TO, READ BACK BY AND VERIFIED WITH: PHARMD E.SINCLAIR AT 1255 ON 03/29/2021 BY T.SAAD.    Staphylococcus aureus (BCID) NOT DETECTED NOT DETECTED Final   Staphylococcus epidermidis DETECTED (A) NOT DETECTED Final    Comment: CRITICAL RESULT  CALLED TO, READ BACK BY AND VERIFIED WITH: PHARMD E.SINCLAIR AT 1255 ON 03/29/2021 BY T.SAAD.    Staphylococcus lugdunensis NOT DETECTED NOT DETECTED Final   Streptococcus species NOT DETECTED NOT DETECTED Final   Streptococcus agalactiae NOT DETECTED NOT DETECTED Final   Streptococcus pneumoniae NOT DETECTED NOT DETECTED Final   Streptococcus pyogenes NOT DETECTED NOT DETECTED Final   A.calcoaceticus-baumannii NOT DETECTED NOT DETECTED Final   Bacteroides fragilis NOT DETECTED NOT DETECTED Final   Enterobacterales NOT DETECTED NOT DETECTED Final   Enterobacter cloacae complex NOT DETECTED NOT DETECTED Final   Escherichia coli NOT DETECTED NOT DETECTED Final   Klebsiella aerogenes NOT DETECTED NOT DETECTED Final   Klebsiella oxytoca NOT DETECTED NOT DETECTED Final   Klebsiella pneumoniae NOT DETECTED NOT DETECTED Final   Proteus species NOT DETECTED NOT DETECTED Final   Salmonella species NOT DETECTED NOT DETECTED Final   Serratia marcescens NOT DETECTED NOT DETECTED Final   Haemophilus influenzae NOT DETECTED NOT DETECTED Final   Neisseria meningitidis NOT DETECTED NOT DETECTED Final   Pseudomonas aeruginosa NOT DETECTED NOT DETECTED Final   Stenotrophomonas maltophilia NOT DETECTED NOT DETECTED Final   Candida albicans NOT DETECTED NOT DETECTED Final   Candida auris NOT DETECTED NOT DETECTED Final   Candida glabrata NOT DETECTED NOT DETECTED Final   Candida krusei NOT DETECTED NOT DETECTED Final   Candida parapsilosis NOT DETECTED NOT DETECTED Final   Candida tropicalis NOT DETECTED NOT DETECTED Final   Cryptococcus neoformans/gattii NOT DETECTED NOT DETECTED Final   Methicillin resistance mecA/C NOT DETECTED NOT DETECTED Final    Comment: Performed at [facility], [address], [place], [place] [address]  VZV PCR, CSF     Status: None   Collection Time: 03/29/21  2:49 PM   Specimen: CSF; Cerebrospinal Fluid  Result Value Ref Range Status   VZV PCR, CSF Negative  Negative Final    Comment: (NOTE) No Varicella Zoster Virus DNA detected. Performed At: [facility] [address] [place], [place] [id number] Jolene Schimke MD XB:[id number]   Fungus Culture With Stain     Status: None (Preliminary result)   Collection Time: 03/29/21  2:49 PM  Result Value Ref Range Status   Fungus Stain Final report  Final    Comment: (NOTE) Performed At: [facility] [address] [place], [place] [id number] Jolene Schimke MD NG:[id number]    Fungus (Mycology) Culture PENDING  Incomplete   Fungal Source PENDING  Incomplete  Fungus Culture Result     Status: None   Collection Time: 03/29/21  2:49 PM  Result Value Ref Range Status   Result 1 Comment  Final    Comment: (NOTE) KOH/Calcofluor preparation:  no fungus observed. Performed At: [facility] [address] [place], [place] [id number] Jolene Schimke MD GU:[id number]   CSF culture w Stat Gram Stain     Status: None   Collection Time: 03/29/21  2:53 PM   Specimen: CSF; Cerebrospinal  Fluid  Result Value Ref Range Status   Specimen Description CSF  Final   Special Requests NONE  Final   Gram Stain   Final    WBC PRESENT,BOTH PMN AND MONONUCLEAR NO ORGANISMS SEEN CYTOSPIN SMEAR    Culture   Final    NO GROWTH 3 DAYS Performed at [facility], [address] [place], [place] [address]    Report Status 04/02/2021 FINAL  Final  Resp Panel by RT-PCR (Flu A&B, Covid) Nasopharyngeal Swab     Status: None   Collection Time: 03/29/21  3:12 PM   Specimen: Nasopharyngeal Swab; Nasopharyngeal(NP) swabs in vial transport medium  Result Value Ref Range Status   SARS Coronavirus 2 by RT PCR NEGATIVE NEGATIVE Final    Comment: (NOTE) SARS-CoV-2 target nucleic acids are NOT DETECTED.  The SARS-CoV-2 RNA is generally detectable in upper respiratory specimens during the acute phase of infection. The lowest concentration of SARS-CoV-2 viral copies this assay can detect is 138  copies/mL. A negative result does not preclude SARS-Cov-2 infection and should not be used as the sole basis for treatment or other patient management decisions. A negative result may occur with  improper specimen collection/handling, submission of specimen other than nasopharyngeal swab, presence of viral mutation(s) within the areas targeted by this assay, and inadequate number of viral copies(<138 copies/mL). A negative result must be combined with clinical observations, patient history, and epidemiological information. The expected result is Negative.  Fact Sheet for Patients:  BloggerCourse.com  Fact Sheet for Healthcare Providers:  SeriousBroker.it  This test is no t yet approved or cleared by the [place] FDA and  has been authorized for detection and/or diagnosis of SARS-CoV-2 by FDA under an Emergency Use Authorization (EUA). This EUA will remain  in effect (meaning this test can be used) for the duration of the COVID-19 declaration under Section 564(b)(1) of the Act, 21 U.S.C.section 360bbb-3(b)(1), unless the authorization is terminated  or revoked sooner.       Influenza A by PCR NEGATIVE NEGATIVE Final   Influenza B by PCR NEGATIVE NEGATIVE Final    Comment: (NOTE) The Xpert Xpress SARS-CoV-2/FLU/RSV plus assay is intended as an aid in the diagnosis of influenza from Nasopharyngeal swab specimens and should not be used as a sole basis for treatment. Nasal washings and aspirates are unacceptable for Xpert Xpress SARS-CoV-2/FLU/RSV testing.  Fact Sheet for Patients: BloggerCourse.com  Fact Sheet for Healthcare Providers: SeriousBroker.it  This test is not yet approved or cleared by the [place] FDA and has been authorized for detection and/or diagnosis of SARS-CoV-2 by FDA under an Emergency Use Authorization (EUA). This EUA will remain in effect (meaning  this test can be used) for the duration of the COVID-19 declaration under Section 564(b)(1) of the Act, 21 U.S.C. section 360bbb-3(b)(1), unless the authorization is terminated or revoked.  Performed at [facility], [address], [place], [place] [address]      Labs: BNP (last 3 results) No results for input(s): BNP in the last 8760 hours. Basic Metabolic Panel: Recent Labs  Lab 03/30/21 0135 03/31/21 1018 04/01/21 0330 04/03/21 0245  NA 137 139 138 139  K 3.8 4.2 4.1 3.7  CL 109 107 106 106  CO2 18* GLUCOSE 97 120* 136* 106*  BUN CREATININE 1.10 0.74 0.77 0.97  CALCIUM 8.0* 8.7* 8.5* 8.3*  MG 1.9  --   --   --  Liver Function Tests: No results for input(s): AST, ALT, ALKPHOS, BILITOT, PROT, ALBUMIN in the last 168 hours. No results for input(s): LIPASE, AMYLASE in the last 168 hours. No results for input(s): AMMONIA in the last 168 hours. CBC: Recent Labs  Lab 03/30/21 0942 03/31/21 1018 04/01/21 0330 04/03/21 0245  WBC 10.6* 10.7* 12.2* 7.1  NEUTROABS 7.6 8.0* 9.9*  --   HGB 11.8* 13.2 11.9* 11.5*  HCT 37.1* 39.0 35.0* 34.5*  MCV 94.2 91.3 90.2 93.2  PLT 215 259 271 257   Cardiac Enzymes: No results for input(s): CKTOTAL, CKMB, CKMBINDEX, TROPONINI in the last 168 hours. BNP: Invalid input(s): POCBNP CBG: No results for input(s): GLUCAP in the last 168 hours. D-Dimer No results for input(s): DDIMER in the last 72 hours. Hgb A1c No results for input(s): HGBA1C in the last 72 hours. Lipid Profile No results for input(s): CHOL, HDL, LDLCALC, TRIG, CHOLHDL, LDLDIRECT in the last 72 hours. Thyroid function studies No results for input(s): TSH, T4TOTAL, T3FREE, THYROIDAB in the last 72 hours.  Invalid input(s): FREET3 Anemia work up No results for input(s): VITAMINB12, FOLATE, FERRITIN, TIBC, IRON, RETICCTPCT in the last 72 hours. Urinalysis    Component Value Date/Time   COLORURINE YELLOW 04/02/2021 1316   APPEARANCEUR  CLEAR 04/02/2021 1316   LABSPEC 1.016 04/02/2021 1316   PHURINE 5.0 04/02/2021 1316   GLUCOSEU NEGATIVE 04/02/2021 1316   HGBUR NEGATIVE 04/02/2021 1316   BILIRUBINUR NEGATIVE 04/02/2021 1316   KETONESUR NEGATIVE 04/02/2021 1316   PROTEINUR NEGATIVE 04/02/2021 1316   NITRITE NEGATIVE 04/02/2021 1316   LEUKOCYTESUR NEGATIVE 04/02/2021 1316   Sepsis Labs Invalid input(s): PROCALCITONIN,  WBC,  LACTICIDVEN Microbiology Recent Results (from the past 240 hour(s))  Urine Culture     Status: None   Collection Time: 03/28/21  2:44 PM   Specimen: In/Out Cath Urine  Result Value Ref Range Status   Specimen Description IN/OUT CATH URINE  Final   Special Requests NONE  Final   Culture   Final    NO GROWTH Performed at [facility], [address], [place], [place] [address]    Report Status 03/29/2021 FINAL  Final  Blood Culture (routine x 2)     Status: None   Collection Time: 03/28/21  2:45 PM   Specimen: BLOOD  Result Value Ref Range Status   Specimen Description BLOOD RIGHT UPPER ARM  Final   Special Requests   Final    BOTTLES DRAWN AEROBIC AND ANAEROBIC Blood Culture results may not be optimal due to an excessive volume of blood received in culture bottles   Culture   Final    NO GROWTH 5 DAYS Performed at [facility], [address], [place], [place] [address]    Report Status 04/02/2021 FINAL  Final  Blood Culture (routine x 2)     Status: Abnormal   Collection Time: 03/28/21  2:50 PM   Specimen: BLOOD  Result Value Ref Range Status   Specimen Description BLOOD LEFT ANTECUBITAL  Final   Special Requests   Final    BOTTLES DRAWN AEROBIC AND ANAEROBIC Blood Culture results may not be optimal due to an excessive volume of blood received in culture bottles   Culture  Setup Time   Final    GRAM POSITIVE COCCI IN CLUSTERS IN BOTH AEROBIC AND ANAEROBIC BOTTLES CRITICAL RESULT CALLED TO, READ BACK BY AND VERIFIED WITH: PHARMD E.SINCLAIR AT 1255 ON 03/29/2021 BY  T.SAAD.    Culture (A)  Final  STAPHYLOCOCCUS EPIDERMIDIS THE SIGNIFICANCE OF ISOLATING THIS ORGANISM FROM A SINGLE SET OF BLOOD CULTURES WHEN MULTIPLE SETS ARE DRAWN IS UNCERTAIN. PLEASE NOTIFY THE MICROBIOLOGY DEPARTMENT WITHIN ONE WEEK IF SPECIATION AND SENSITIVITIES ARE REQUIRED. Performed at [facility], [address] [place], [place] [address]    Report Status 03/31/2021 FINAL  Final  Blood Culture ID Panel (Reflexed)     Status: Abnormal   Collection Time: 03/28/21  2:50 PM  Result Value Ref Range Status   Enterococcus faecalis NOT DETECTED NOT DETECTED Final   Enterococcus Faecium NOT DETECTED NOT DETECTED Final   Listeria monocytogenes NOT DETECTED NOT DETECTED Final   Staphylococcus species DETECTED (A) NOT DETECTED Final    Comment: CRITICAL RESULT CALLED TO, READ BACK BY AND VERIFIED WITH: PHARMD E.SINCLAIR AT 1255 ON 03/29/2021 BY T.SAAD.    Staphylococcus aureus (BCID) NOT DETECTED NOT DETECTED Final   Staphylococcus epidermidis DETECTED (A) NOT DETECTED Final    Comment: CRITICAL RESULT CALLED TO, READ BACK BY AND VERIFIED WITH: PHARMD E.SINCLAIR AT 1255 ON 03/29/2021 BY T.SAAD.    Staphylococcus lugdunensis NOT DETECTED NOT DETECTED Final   Streptococcus species NOT DETECTED NOT DETECTED Final   Streptococcus agalactiae NOT DETECTED NOT DETECTED Final   Streptococcus pneumoniae NOT DETECTED NOT DETECTED Final   Streptococcus pyogenes NOT DETECTED NOT DETECTED Final   A.calcoaceticus-baumannii NOT DETECTED NOT DETECTED Final   Bacteroides fragilis NOT DETECTED NOT DETECTED Final   Enterobacterales NOT DETECTED NOT DETECTED Final   Enterobacter cloacae complex NOT DETECTED NOT DETECTED Final   Escherichia coli NOT DETECTED NOT DETECTED Final   Klebsiella aerogenes NOT DETECTED NOT DETECTED Final   Klebsiella oxytoca NOT DETECTED NOT DETECTED Final   Klebsiella pneumoniae NOT DETECTED NOT DETECTED Final   Proteus species NOT DETECTED NOT DETECTED Final    Salmonella species NOT DETECTED NOT DETECTED Final   Serratia marcescens NOT DETECTED NOT DETECTED Final   Haemophilus influenzae NOT DETECTED NOT DETECTED Final   Neisseria meningitidis NOT DETECTED NOT DETECTED Final   Pseudomonas aeruginosa NOT DETECTED NOT DETECTED Final   Stenotrophomonas maltophilia NOT DETECTED NOT DETECTED Final   Candida albicans NOT DETECTED NOT DETECTED Final   Candida auris NOT DETECTED NOT DETECTED Final   Candida glabrata NOT DETECTED NOT DETECTED Final   Candida krusei NOT DETECTED NOT DETECTED Final   Candida parapsilosis NOT DETECTED NOT DETECTED Final   Candida tropicalis NOT DETECTED NOT DETECTED Final   Cryptococcus neoformans/gattii NOT DETECTED NOT DETECTED Final   Methicillin resistance mecA/C NOT DETECTED NOT DETECTED Final    Comment: Performed at [facility], [address], [place], [place] [address]  VZV PCR, CSF     Status: None   Collection Time: 03/29/21  2:49 PM   Specimen: CSF; Cerebrospinal Fluid  Result Value Ref Range Status   VZV PCR, CSF Negative Negative Final    Comment: (NOTE) No Varicella Zoster Virus DNA detected. Performed At: [facility] [address] [place], [place] [id number] Jolene Schimke MD OV:[id number]   Fungus Culture With Stain     Status: None (Preliminary result)   Collection Time: 03/29/21  2:49 PM  Result Value Ref Range Status   Fungus Stain Final report  Final    Comment: (NOTE) Performed At: [facility] [address] [place], [place] [id number] Jolene Schimke MD ZS:[id number]    Fungus (Mycology) Culture PENDING  Incomplete   Fungal Source PENDING  Incomplete  Fungus Culture Result     Status:  None   Collection Time: 03/29/21  2:49 PM  Result Value Ref Range Status   Result 1 Comment  Final    Comment: (NOTE) KOH/Calcofluor preparation:  no fungus observed. Performed At: [facility] [address] [place], [place] [id number] Jolene Schimke MD WU:[id number]    CSF culture w Stat Gram Stain     Status: None   Collection Time: 03/29/21  2:53 PM   Specimen: CSF; Cerebrospinal Fluid  Result Value Ref Range Status   Specimen Description CSF  Final   Special Requests NONE  Final   Gram Stain   Final    WBC PRESENT,BOTH PMN AND MONONUCLEAR NO ORGANISMS SEEN CYTOSPIN SMEAR    Culture   Final    NO GROWTH 3 DAYS Performed at [facility], [address] [place], [place] [address]    Report Status 04/02/2021 FINAL  Final  Resp Panel by RT-PCR (Flu A&B, Covid) Nasopharyngeal Swab     Status: None   Collection Time: 03/29/21  3:12 PM   Specimen: Nasopharyngeal Swab; Nasopharyngeal(NP) swabs in vial transport medium  Result Value Ref Range Status   SARS Coronavirus 2 by RT PCR NEGATIVE NEGATIVE Final    Comment: (NOTE) SARS-CoV-2 target nucleic acids are NOT DETECTED.  The SARS-CoV-2 RNA is generally detectable in upper respiratory specimens during the acute phase of infection. The lowest concentration of SARS-CoV-2 viral copies this assay can detect is 138 copies/mL. A negative result does not preclude SARS-Cov-2 infection and should not be used as the sole basis for treatment or other patient management decisions. A negative result may occur with  improper specimen collection/handling, submission of specimen other than nasopharyngeal swab, presence of viral mutation(s) within the areas targeted by this assay, and inadequate number of viral copies(<138 copies/mL). A negative result must be combined with clinical observations, patient history, and epidemiological information. The expected result is Negative.  Fact Sheet for Patients:  BloggerCourse.com  Fact Sheet for Healthcare Providers:  SeriousBroker.it  This test is no t yet approved or cleared by the [place] FDA and  has been authorized for detection and/or diagnosis of SARS-CoV-2 by FDA under an Emergency Use  Authorization (EUA). This EUA will remain  in effect (meaning this test can be used) for the duration of the COVID-19 declaration under Section 564(b)(1) of the Act, 21 U.S.C.section 360bbb-3(b)(1), unless the authorization is terminated  or revoked sooner.       Influenza A by PCR NEGATIVE NEGATIVE Final   Influenza B by PCR NEGATIVE NEGATIVE Final    Comment: (NOTE) The Xpert Xpress SARS-CoV-2/FLU/RSV plus assay is intended as an aid in the diagnosis of influenza from Nasopharyngeal swab specimens and should not be used as a sole basis for treatment. Nasal washings and aspirates are unacceptable for Xpert Xpress SARS-CoV-2/FLU/RSV testing.  Fact Sheet for Patients: BloggerCourse.com  Fact Sheet for Healthcare Providers: SeriousBroker.it  This test is not yet approved or cleared by the [place] FDA and has been authorized for detection and/or diagnosis of SARS-CoV-2 by FDA under an Emergency Use Authorization (EUA). This EUA will remain in effect (meaning this test can be used) for the duration of the COVID-19 declaration under Section 564(b)(1) of the Act, 21 U.S.C. section 360bbb-3(b)(1), unless the authorization is terminated or revoked.  Performed at [facility], [address], [place], [place] [address]      Time coordinating discharge: Over 30 minutes  SIGNED:   Marinda Elk, MD  [facility]  04/05/2021, 8:58 AM Pager   If 7PM-7AM, please contact night-coverage [email or web address] Password [email or web address]

## 2021-04-05 NOTE — Progress Notes (Signed)
Patient is going to rehab for continued care.  No new results from an ID standpoint and I have discussed with the lab pending results.  Other studies including arboviral infections pending and samples sent to the [facility].  Anticipate results back later this month.  Results (negative or positive) will not change his treatment course.   He can follow up with ID after he leaves CIR or once the results return.   Gardiner Barefoot, MD

## 2021-04-05 NOTE — Progress Notes (Signed)
Inpatient Rehabilitation Admissions Coordinator   I met with patient and his wife at bedside to discuss goals and expectations of a Cir admit. I have insurance approval and CIR bed available Saturday . [place] can call rehab at [phone] at 12 noon to give report. Dr Naaman Plummer will see patient tomorrow . I will make the arrangements to admit Saturday when bed is available.  Danne Baxter, RN, MSN Rehab Admissions Coordinator [phone] 04/05/2021 4:24 PM

## 2021-04-06 ENCOUNTER — Inpatient Hospital Stay (HOSPITAL_COMMUNITY)
Admission: RE | Admit: 2021-04-06 | Discharge: 2021-04-25 | DRG: 098 | Disposition: A | Discharge status: Home / Self Care | Payer: 59 | Source: Intra-hospital | Attending: Physical Medicine and Rehabilitation | Admitting: Physical Medicine and Rehabilitation

## 2021-04-06 ENCOUNTER — Encounter (HOSPITAL_COMMUNITY): Payer: Self-pay | Admitting: Physical Medicine and Rehabilitation

## 2021-04-06 ENCOUNTER — Other Ambulatory Visit: Payer: Self-pay

## 2021-04-06 DIAGNOSIS — G0490 Encephalitis and encephalomyelitis, unspecified: Secondary | ICD-10-CM | POA: Diagnosis present

## 2021-04-06 DIAGNOSIS — Z716 Tobacco abuse counseling: Secondary | ICD-10-CM | POA: Diagnosis not present

## 2021-04-06 DIAGNOSIS — K219 Gastro-esophageal reflux disease without esophagitis: Secondary | ICD-10-CM | POA: Diagnosis present

## 2021-04-06 DIAGNOSIS — L853 Xerosis cutis: Secondary | ICD-10-CM | POA: Diagnosis not present

## 2021-04-06 DIAGNOSIS — Z1611 Resistance to penicillins: Secondary | ICD-10-CM | POA: Diagnosis present

## 2021-04-06 DIAGNOSIS — B9620 Unspecified Escherichia coli [E. coli] as the cause of diseases classified elsewhere: Secondary | ICD-10-CM | POA: Diagnosis not present

## 2021-04-06 DIAGNOSIS — G479 Sleep disorder, unspecified: Secondary | ICD-10-CM | POA: Diagnosis not present

## 2021-04-06 DIAGNOSIS — Z5329 Procedure and treatment not carried out because of patient's decision for other reasons: Secondary | ICD-10-CM | POA: Diagnosis not present

## 2021-04-06 DIAGNOSIS — R001 Bradycardia, unspecified: Secondary | ICD-10-CM | POA: Diagnosis present

## 2021-04-06 DIAGNOSIS — G054 Myelitis in diseases classified elsewhere: Secondary | ICD-10-CM | POA: Diagnosis present

## 2021-04-06 DIAGNOSIS — E871 Hypo-osmolality and hyponatremia: Secondary | ICD-10-CM | POA: Diagnosis present

## 2021-04-06 DIAGNOSIS — Z8661 Personal history of infections of the central nervous system: Secondary | ICD-10-CM | POA: Diagnosis not present

## 2021-04-06 DIAGNOSIS — F17210 Nicotine dependence, cigarettes, uncomplicated: Secondary | ICD-10-CM | POA: Diagnosis present

## 2021-04-06 DIAGNOSIS — Z79899 Other long term (current) drug therapy: Secondary | ICD-10-CM | POA: Diagnosis not present

## 2021-04-06 DIAGNOSIS — R339 Retention of urine, unspecified: Secondary | ICD-10-CM | POA: Diagnosis present

## 2021-04-06 DIAGNOSIS — N319 Neuromuscular dysfunction of bladder, unspecified: Secondary | ICD-10-CM | POA: Diagnosis present

## 2021-04-06 DIAGNOSIS — G0491 Myelitis, unspecified: Secondary | ICD-10-CM | POA: Diagnosis not present

## 2021-04-06 DIAGNOSIS — L89626 Pressure-induced deep tissue damage of left heel: Secondary | ICD-10-CM | POA: Diagnosis not present

## 2021-04-06 DIAGNOSIS — T148XXA Other injury of unspecified body region, initial encounter: Secondary | ICD-10-CM

## 2021-04-06 DIAGNOSIS — N390 Urinary tract infection, site not specified: Secondary | ICD-10-CM | POA: Diagnosis not present

## 2021-04-06 DIAGNOSIS — L89616 Pressure-induced deep tissue damage of right heel: Secondary | ICD-10-CM | POA: Diagnosis not present

## 2021-04-06 DIAGNOSIS — M62838 Other muscle spasm: Secondary | ICD-10-CM | POA: Diagnosis not present

## 2021-04-06 DIAGNOSIS — G8222 Paraplegia, incomplete: Secondary | ICD-10-CM | POA: Diagnosis present

## 2021-04-06 DIAGNOSIS — R799 Abnormal finding of blood chemistry, unspecified: Secondary | ICD-10-CM | POA: Diagnosis not present

## 2021-04-06 DIAGNOSIS — R531 Weakness: Secondary | ICD-10-CM | POA: Diagnosis present

## 2021-04-06 DIAGNOSIS — M792 Neuralgia and neuritis, unspecified: Secondary | ICD-10-CM | POA: Diagnosis not present

## 2021-04-06 DIAGNOSIS — K592 Neurogenic bowel, not elsewhere classified: Secondary | ICD-10-CM | POA: Diagnosis present

## 2021-04-06 LAB — CBC
HCT: 44.8 % (ref 39.0–52.0)
Hemoglobin: 14.7 g/dL (ref 13.0–17.0)
MCH: 30.4 pg (ref 26.0–34.0)
MCHC: 32.8 g/dL (ref 30.0–36.0)
MCV: 92.6 fL (ref 80.0–100.0)
Platelets: 296 10*3/uL (ref 150–400)
RBC: 4.84 MIL/uL (ref 4.22–5.81)
RDW: 14 % (ref 11.5–15.5)
WBC: 8.8 10*3/uL (ref 4.0–10.5)
nRBC: 0 % (ref 0.0–0.2)

## 2021-04-06 LAB — CREATININE, SERUM
Creatinine, Ser: 1 mg/dL (ref 0.61–1.24)
GFR, Estimated: >60 mL/min (ref >60)

## 2021-04-06 MED ORDER — ACETAMINOPHEN 650 MG RE SUPP: 650.0000 mg | Freq: Four times a day (QID) | RECTAL | Status: DC | PRN

## 2021-04-06 MED ORDER — LEVETIRACETAM 500 MG PO TABS
500.0000 mg | ORAL_TABLET | Freq: Two times a day (BID) | ORAL | Status: DC
  Administered 2021-04-06 – 2021-04-25 (×38): 500 mg via ORAL
  Filled 2021-04-06 (×38): qty 1

## 2021-04-06 MED ORDER — ENOXAPARIN SODIUM 40 MG/0.4ML IJ SOSY: 40.0000 mg | PREFILLED_SYRINGE | INTRAMUSCULAR | Status: DC

## 2021-04-06 MED ORDER — SENNOSIDES-DOCUSATE SODIUM 8.6-50 MG PO TABS
2.0000 | ORAL_TABLET | Freq: Every day | ORAL | Status: DC
  Administered 2021-04-06 – 2021-04-23 (×9): 2 via ORAL
  Filled 2021-04-06 (×14): qty 2

## 2021-04-06 MED ORDER — ACETAMINOPHEN 325 MG PO TABS
650.0000 mg | ORAL_TABLET | Freq: Four times a day (QID) | ORAL | Status: DC | PRN
  Administered 2021-04-07 – 2021-04-13 (×2): 650 mg via ORAL
  Filled 2021-04-06 (×3): qty 2

## 2021-04-06 MED ORDER — ENOXAPARIN SODIUM 30 MG/0.3ML IJ SOSY
30.0000 mg | PREFILLED_SYRINGE | Freq: Two times a day (BID) | INTRAMUSCULAR | Status: DC
  Administered 2021-04-06 – 2021-04-25 (×38): 30 mg via SUBCUTANEOUS
  Filled 2021-04-06 (×39): qty 0.3

## 2021-04-06 MED ORDER — POLYETHYLENE GLYCOL 3350 17 G PO PACK: 17.0000 g | PACK | Freq: Two times a day (BID) | ORAL | Status: DC

## 2021-04-06 MED ORDER — POLYETHYLENE GLYCOL 3350 17 G PO PACK
17.0000 g | PACK | Freq: Every day | ORAL | Status: DC
  Administered 2021-04-07 – 2021-04-25 (×16): 17 g via ORAL
  Filled 2021-04-06 (×18): qty 1

## 2021-04-06 MED ORDER — IBUPROFEN 600 MG PO TABS
600.0000 mg | ORAL_TABLET | Freq: Four times a day (QID) | ORAL | Status: DC | PRN
  Filled 2021-04-06: qty 1

## 2021-04-06 MED ORDER — TAMSULOSIN HCL 0.4 MG PO CAPS
0.4000 mg | ORAL_CAPSULE | Freq: Every day | ORAL | Status: DC
  Administered 2021-04-07 – 2021-04-13 (×7): 0.4 mg via ORAL
  Filled 2021-04-06 (×7): qty 1

## 2021-04-06 NOTE — Progress Notes (Signed)
[facility] PROGRESS NOTE    Progress Note  James Obrien  DXI:[id number] DOB: 1985-07-31 DOA: 03/28/2021 PCP: Pcp, No     Brief Narrative:   James Obrien is an 35 y.o. male no significant past medical history brought into the ED for generalized tonic clonic seizures, patient was also complaining of left groin area that had a and some discharge from his penis recently treated for gonorrhea in the ED was found to be febrile, blood cultures were ordered CT of the head showed no acute findings MRI of the brain showed abnormal hyperintensity and T2 in both hemispheres concerning for acute demyelinating disease, and underwent MRI of the brain with contrast that favor demyelinating disease.  CT scan of the abdomen and pelvis in the left groin area shows cellulitis and inguinal lymphadenopathy. He was seen by neurologist underwent lumbar puncture and meningitis treatment.    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 EEG showed cortical dysfunction of the left hemisphere nonspecific. Possibilities includes infectious encephalitis likely due to [place] Nile virus versus inflammatory encephalitis. He is currently on Keppra. Physical therapy evaluated the patient recommended inpatient rehab, awaiting placement. No changes overnight patient is stable to be discharged was inpatient rehab  Sinus bradycardia: Echocardiogram was unremarkable needed atropine in the ED seen by cardiology they recommended no further work-up.  Mild leukocytosis: Likely due to steroids, has remained afebrile.  Steroids have been discontinued on 04/01/2021.  Urinary retention: We will try a voiding trial he is currently on Flomax.   DVT prophylaxis: lovenox Family Communication:wife Status is: Inpatient  Remains inpatient appropriate because:Hemodynamically unstable  Dispo: The patient is from: Home              Anticipated d/c  is to: [facility]              Patient currently is not medically stable to d/c.   Difficult to place patient No        Code Status:     Code Status Orders  (From admission, onward)           Start     Ordered   03/28/21 1919  Full code  Continuous        03/28/21 1920           Code Status History     This patient has a current code status but no historical code status.         IV Access:   Peripheral IV   Procedures and diagnostic studies:   No results found.   Medical Consultants:   None.   Subjective:    James Obrien no complaints overnight, wants to get this started  Objective:    Vitals:   04/05/21 2040 04/06/21 0012 04/06/21 0441 04/06/21 0857  BP: (!) 111/57 (!) 111/59 116/70 119/76  Pulse: 70 (!) 56 (!) 55 (!) 53  Resp: 18 19 17    Temp: 98 F (36.7 C) 98.2 F (36.8 C) 98.3 F (36.8 C) 97.6 F (36.4 C)  TempSrc: Oral Oral Oral Oral  SpO2: 98% 97% 97%   Weight:      Height:       SpO2: 97 % O2 Flow Rate (L/min): 2 L/min   Intake/Output Summary (Last 24 hours) at 04/06/2021 1006 Last data filed at 04/06/2021 0012 Gross per 24 hour  Intake --  Output 2100 ml  Net -2100 ml    06/06/2021  03/28/21 1500  Weight: 74.8 kg    Exam: General exam: In no acute distress. Respiratory system: Good air movement and clear to auscultation. Cardiovascular system: S1 & S2 heard, RRR. No JVD. Gastrointestinal system: Abdomen is nondistended, soft and nontender.  Extremities: No pedal edema. Skin: No rashes, lesions or ulcers Psychiatry: Judgement and insight appear normal.   Data Reviewed:    Labs: Basic Metabolic Panel: Recent Labs  Lab 03/31/21 1018 04/01/21 0330 04/03/21 0245  NA 139 138 139  K 4.2 4.1 3.7  CL 107 106 106  CO2 22 24 25   GLUCOSE 120* 136* 106*  BUN 8 10 15   CREATININE 0.74 0.77 0.97  CALCIUM 8.7* 8.5* 8.3*    GFR Estimated Creatinine Clearance: 114.6 mL/min (by C-G formula based on SCr  of 0.97 mg/dL). Liver Function Tests: No results for input(s): AST, ALT, ALKPHOS, BILITOT, PROT, ALBUMIN in the last 168 hours.  No results for input(s): LIPASE, AMYLASE in the last 168 hours. No results for input(s): AMMONIA in the last 168 hours. Coagulation profile No results for input(s): INR, PROTIME in the last 168 hours.  COVID-19 Labs  No results for input(s): DDIMER, FERRITIN, LDH, CRP in the last 72 hours.  Lab Results  Component Value Date   SARSCOV2NAA NEGATIVE 03/29/2021    CBC: Recent Labs  Lab 03/31/21 1018 04/01/21 0330 04/03/21 0245  WBC 10.7* 12.2* 7.1  NEUTROABS 8.0* 9.9*  --   HGB 13.2 11.9* 11.5*  HCT 39.0 35.0* 34.5*  MCV 91.3 90.2 93.2  PLT 259 271 257    Cardiac Enzymes: No results for input(s): CKTOTAL, CKMB, CKMBINDEX, TROPONINI in the last 168 hours. BNP (last 3 results) No results for input(s): PROBNP in the last 8760 hours. CBG: No results for input(s): GLUCAP in the last 168 hours. D-Dimer: No results for input(s): DDIMER in the last 72 hours. Hgb A1c: No results for input(s): HGBA1C in the last 72 hours. Lipid Profile: No results for input(s): CHOL, HDL, LDLCALC, TRIG, CHOLHDL, LDLDIRECT in the last 72 hours. Thyroid function studies: No results for input(s): TSH, T4TOTAL, T3FREE, THYROIDAB in the last 72 hours.  Invalid input(s): FREET3 Anemia work up: No results for input(s): VITAMINB12, FOLATE, FERRITIN, TIBC, IRON, RETICCTPCT in the last 72 hours. Sepsis Labs: Recent Labs  Lab 03/31/21 1018 04/01/21 0330 04/03/21 0245  WBC 10.7* 12.2* 7.1    Microbiology Recent Results (from the past 240 hour(s))  Urine Culture     Status: None   Collection Time: 03/28/21  2:44 PM   Specimen: In/Out Cath Urine  Result Value Ref Range Status   Specimen Description IN/OUT CATH URINE  Final   Special Requests NONE  Final   Culture   Final    NO GROWTH Performed at [facility], [address], [place], [place] [address]     Report Status 03/29/2021 FINAL  Final  Blood Culture (routine x 2)     Status: None   Collection Time: 03/28/21  2:45 PM   Specimen: BLOOD  Result Value Ref Range Status   Specimen Description BLOOD RIGHT UPPER ARM  Final   Special Requests   Final    BOTTLES DRAWN AEROBIC AND ANAEROBIC Blood Culture results may not be optimal due to an excessive volume of blood received in culture bottles   Culture   Final    NO GROWTH 5 DAYS Performed at [facility], [address] [place], [place] [address]    Report Status 04/02/2021  FINAL  Final  Blood Culture (routine x 2)     Status: Abnormal   Collection Time: 03/28/21  2:50 PM   Specimen: BLOOD  Result Value Ref Range Status   Specimen Description BLOOD LEFT ANTECUBITAL  Final   Special Requests   Final    BOTTLES DRAWN AEROBIC AND ANAEROBIC Blood Culture results may not be optimal due to an excessive volume of blood received in culture bottles   Culture  Setup Time   Final    GRAM POSITIVE COCCI IN CLUSTERS IN BOTH AEROBIC AND ANAEROBIC BOTTLES CRITICAL RESULT CALLED TO, READ BACK BY AND VERIFIED WITH: PHARMD E.SINCLAIR AT 1255 ON 03/29/2021 BY T.SAAD.    Culture (A)  Final    STAPHYLOCOCCUS EPIDERMIDIS THE SIGNIFICANCE OF ISOLATING THIS ORGANISM FROM A SINGLE SET OF BLOOD CULTURES WHEN MULTIPLE SETS ARE DRAWN IS UNCERTAIN. PLEASE NOTIFY THE MICROBIOLOGY DEPARTMENT WITHIN ONE WEEK IF SPECIATION AND SENSITIVITIES ARE REQUIRED. Performed at [facility], [address] [place], [place] [address]    Report Status 03/31/2021 FINAL  Final  Blood Culture ID Panel (Reflexed)     Status: Abnormal   Collection Time: 03/28/21  2:50 PM  Result Value Ref Range Status   Enterococcus faecalis NOT DETECTED NOT DETECTED Final   Enterococcus Faecium NOT DETECTED NOT DETECTED Final   Listeria monocytogenes NOT DETECTED NOT DETECTED Final   Staphylococcus species DETECTED (A) NOT DETECTED Final    Comment: CRITICAL RESULT CALLED TO, READ  BACK BY AND VERIFIED WITH: PHARMD E.SINCLAIR AT 1255 ON 03/29/2021 BY T.SAAD.    Staphylococcus aureus (BCID) NOT DETECTED NOT DETECTED Final   Staphylococcus epidermidis DETECTED (A) NOT DETECTED Final    Comment: CRITICAL RESULT CALLED TO, READ BACK BY AND VERIFIED WITH: PHARMD E.SINCLAIR AT 1255 ON 03/29/2021 BY T.SAAD.    Staphylococcus lugdunensis NOT DETECTED NOT DETECTED Final   Streptococcus species NOT DETECTED NOT DETECTED Final   Streptococcus agalactiae NOT DETECTED NOT DETECTED Final   Streptococcus pneumoniae NOT DETECTED NOT DETECTED Final   Streptococcus pyogenes NOT DETECTED NOT DETECTED Final   A.calcoaceticus-baumannii NOT DETECTED NOT DETECTED Final   Bacteroides fragilis NOT DETECTED NOT DETECTED Final   Enterobacterales NOT DETECTED NOT DETECTED Final   Enterobacter cloacae complex NOT DETECTED NOT DETECTED Final   Escherichia coli NOT DETECTED NOT DETECTED Final   Klebsiella aerogenes NOT DETECTED NOT DETECTED Final   Klebsiella oxytoca NOT DETECTED NOT DETECTED Final   Klebsiella pneumoniae NOT DETECTED NOT DETECTED Final   Proteus species NOT DETECTED NOT DETECTED Final   Salmonella species NOT DETECTED NOT DETECTED Final   Serratia marcescens NOT DETECTED NOT DETECTED Final   Haemophilus influenzae NOT DETECTED NOT DETECTED Final   Neisseria meningitidis NOT DETECTED NOT DETECTED Final   Pseudomonas aeruginosa NOT DETECTED NOT DETECTED Final   Stenotrophomonas maltophilia NOT DETECTED NOT DETECTED Final   Candida albicans NOT DETECTED NOT DETECTED Final   Candida auris NOT DETECTED NOT DETECTED Final   Candida glabrata NOT DETECTED NOT DETECTED Final   Candida krusei NOT DETECTED NOT DETECTED Final   Candida parapsilosis NOT DETECTED NOT DETECTED Final   Candida tropicalis NOT DETECTED NOT DETECTED Final   Cryptococcus neoformans/gattii NOT DETECTED NOT DETECTED Final   Methicillin resistance mecA/C NOT DETECTED NOT DETECTED Final    Comment: Performed at  [facility], [address] [place], [place] [address]  VZV PCR, CSF     Status: None   Collection Time: 03/29/21  2:49 PM  Specimen: CSF; Cerebrospinal Fluid  Result Value Ref Range Status   VZV PCR, CSF Negative Negative Final    Comment: (NOTE) No Varicella Zoster Virus DNA detected. Performed At: [facility] [address] [place], [place] [id number] Jolene Schimke MD WU:[id number]   Fungus Culture With Stain     Status: None (Preliminary result)   Collection Time: 03/29/21  2:49 PM  Result Value Ref Range Status   Fungus Stain Final report  Final    Comment: (NOTE) Performed At: [facility] [address] [place], [place] [id number] Jolene Schimke MD VH:[id number]    Fungus (Mycology) Culture PENDING  Incomplete   Fungal Source PENDING  Incomplete  Fungus Culture Result     Status: None   Collection Time: 03/29/21  2:49 PM  Result Value Ref Range Status   Result 1 Comment  Final    Comment: (NOTE) KOH/Calcofluor preparation:  no fungus observed. Performed At: [facility] [address] [place], [place] [id number] Jolene Schimke MD UV:[id number]   CSF culture w Stat Gram Stain     Status: None   Collection Time: 03/29/21  2:53 PM   Specimen: CSF; Cerebrospinal Fluid  Result Value Ref Range Status   Specimen Description CSF  Final   Special Requests NONE  Final   Gram Stain   Final    WBC PRESENT,BOTH PMN AND MONONUCLEAR NO ORGANISMS SEEN CYTOSPIN SMEAR    Culture   Final    NO GROWTH 3 DAYS Performed at [facility], [address], [place], [place] [address]    Report Status 04/02/2021 FINAL  Final  Resp Panel by RT-PCR (Flu A&B, Covid) Nasopharyngeal Swab     Status: None   Collection Time: 03/29/21  3:12 PM   Specimen: Nasopharyngeal Swab; Nasopharyngeal(NP) swabs in vial transport medium  Result Value Ref Range Status   SARS Coronavirus 2 by RT PCR NEGATIVE NEGATIVE Final    Comment: (NOTE) SARS-CoV-2 target  nucleic acids are NOT DETECTED.  The SARS-CoV-2 RNA is generally detectable in upper respiratory specimens during the acute phase of infection. The lowest concentration of SARS-CoV-2 viral copies this assay can detect is 138 copies/mL. A negative result does not preclude SARS-Cov-2 infection and should not be used as the sole basis for treatment or other patient management decisions. A negative result may occur with  improper specimen collection/handling, submission of specimen other than nasopharyngeal swab, presence of viral mutation(s) within the areas targeted by this assay, and inadequate number of viral copies(<138 copies/mL). A negative result must be combined with clinical observations, patient history, and epidemiological information. The expected result is Negative.  Fact Sheet for Patients:  BloggerCourse.com  Fact Sheet for Healthcare Providers:  SeriousBroker.it  This test is no t yet approved or cleared by the [place] FDA and  has been authorized for detection and/or diagnosis of SARS-CoV-2 by FDA under an Emergency Use Authorization (EUA). This EUA will remain  in effect (meaning this test can be used) for the duration of the COVID-19 declaration under Section 564(b)(1) of the Act, 21 U.S.C.section 360bbb-3(b)(1), unless the authorization is terminated  or revoked sooner.       Influenza A by PCR NEGATIVE NEGATIVE Final   Influenza B by PCR NEGATIVE NEGATIVE Final    Comment: (NOTE) The Xpert Xpress SARS-CoV-2/FLU/RSV plus assay is intended as an aid in the diagnosis of influenza from Nasopharyngeal swab specimens and should not be used as a sole basis for treatment. Nasal washings and aspirates  are unacceptable for Xpert Xpress SARS-CoV-2/FLU/RSV testing.  Fact Sheet for Patients: BloggerCourse.com  Fact Sheet for Healthcare  Providers: SeriousBroker.it  This test is not yet approved or cleared by the [place] FDA and has been authorized for detection and/or diagnosis of SARS-CoV-2 by FDA under an Emergency Use Authorization (EUA). This EUA will remain in effect (meaning this test can be used) for the duration of the COVID-19 declaration under Section 564(b)(1) of the Act, 21 U.S.C. section 360bbb-3(b)(1), unless the authorization is terminated or revoked.  Performed at [facility], [address] [place], [place] [address]      Medications:    Chlorhexidine Gluconate Cloth  6 each Topical Daily   enoxaparin (LOVENOX) injection  40 mg Subcutaneous Q24H   levETIRAcetam  500 mg Oral BID   polyethylene glycol  17 g Oral BID   tamsulosin  0.4 mg Oral Daily   Continuous Infusions:    LOS: 8 days   Marinda Elk  [facility]  04/06/2021, 10:06 AM

## 2021-04-06 NOTE — Plan of Care (Signed)
  Problem: Education: Goal: Expressions of having a comfortable level of knowledge regarding the disease process will increase Outcome: Progressing   Problem: Coping: Goal: Ability to adjust to condition or change in health will improve Outcome: Progressing Goal: Ability to identify appropriate support needs will improve Outcome: Progressing   Problem: Health Behavior/Discharge Planning: Goal: Compliance with prescribed medication regimen will improve Outcome: Progressing   Problem: Medication: Goal: Risk for medication side effects will decrease Outcome: Progressing   Problem: Clinical Measurements: Goal: Complications related to the disease process, condition or treatment will be avoided or minimized Outcome: Progressing   Problem: Safety: Goal: Verbalization of understanding the information provided will improve Outcome: Progressing   Problem: Self-Concept: Goal: Level of anxiety will decrease Outcome: Progressing   Problem: Activity: Goal: Ability to perform activities at highest level will improve Outcome: Progressing Goal: Muscle strength will improve Outcome: Progressing   Problem: Bowel/Gastric: Goal: Ability to demonstrate the techniques of an individualized bowel program will improve Outcome: Progressing   Problem: Education: Goal: Knowledge of disease or condition will improve Outcome: Progressing   Problem: Coping: Goal: Ability to identify and develop effective coping behavior will improve Outcome: Progressing Goal: Ability to verbalize feelings will improve Outcome: Progressing   Problem: Self-Care: Goal: Ability to participate in self-care as condition permits will improve Outcome: Progressing   Problem: Urinary Elimination: Goal: Ability to achieve a regular elimination pattern will improve Outcome: Progressing

## 2021-04-06 NOTE — Progress Notes (Signed)
INPATIENT REHABILITATION ADMISSION NOTE   Arrival Method: bed     Mental Orientation: A&Ox4   Assessment: done   Skin: done   IV'S: saline locked on left FA   Pain: none   Tubes and Drains: Foley in place   Safety Measures: reviewed with pt and wife   Vital Signs: done   Height and Weight: done   Rehab Orientation: done   Family: wife is at bedside. (Stays the night)    Notes: done  Marylu Lund, RN

## 2021-04-06 NOTE — H&P (Addendum)
Physical Medicine and Rehabilitation Admission H&P        Chief Complaint  Patient presents with   Seizures  : HPI: James Obrien is a 35 year old right-handed male with unremarkable past medical history except for recent penile and scrotal lesions thought to be due to a spider bite as well as tobacco use on no prescription medications.  Per chart review patient lives with spouse.  1 level home 3 steps to entry.  3 children ages 12/8/5.  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  Presented 03/28/2021 with tingling and numbness of the left hand which quickly progressed to the lower extremities as well as a witnessed generalized tonic-clonic seizure that lasted 2-5 minutes.  Patient was loaded with Keppra.  EEG showed no seizure.  Cranial CT scan negative.  MRI showed multifocal abnormal hyperintense T2 weighted signal within both hemispheres but greatest at the right pre and postcentral gyri.  Appearance was concerning for acute demyelination.  MRI thoracic lumbar spine longitudinally extensive cord signal abnormality/myelitis.  No hematoma or other complications.  CT of the chest abdomen pelvis showed subcutaneous fat stranding left lower quadrant anterior abdominal wall and left inguinal region consistent with cellulitis.  No fluid collection or abscess.  Reactive lymphadenopathy within the pelvis and bilateral inguinal regions.  Admission chemistries unremarkable except creatinine 1.27, urine cultures no growth, blood cultures no growth to date, lactic acid 4.1, HIV antibody nonreactive, RPR nonreactive, GC/chlamydia probe positive for gonorrhea..  Neurology consulted lumbar puncture showed evidence of pleocytosis and CBC with elevated WBCC.  Consistent with myelitis, encephalitis.  Patient was initially placed on empiric acyclovir for possible encephalitis discontinued 10/4 with negative HSV.  ID did see the patient and bacterial antibiotics were added after neutrophilic CSF was seen in  addition to blood cultures positive for methicillin sensitive Staphylococcus epidermidis.  He was treated with a lower dose of steroids to help reduce potential spinal cord swelling.  Patient's antibiotic course has been completed.  Hospital course patient did have some bradycardia with cardiology follow-up and echocardiogram completed with ejection fraction of 55 to 60% no wall motion abnormality and no further work-up was indicated.  Patient bouts of urinary retention and failed voiding trial with Foley tube in place and Flomax initiated.  Lovenox added for DVT prophylaxis.  Therapy evaluations completed due to patient decreased functional mobility was admitted for a comprehensive rehab program.   Review of Systems  Constitutional:  Negative for chills and fever.  HENT:  Negative for hearing loss.   Eyes:  Negative for blurred vision and double vision.  Respiratory:  Negative for cough and shortness of breath.   Cardiovascular:  Negative for chest pain, palpitations and leg swelling.  Gastrointestinal:  Positive for constipation. Negative for heartburn, nausea and vomiting.  Genitourinary:  Negative for dysuria, flank pain and hematuria.  Musculoskeletal:  Positive for joint pain and myalgias.  Skin:  Negative for rash.  Neurological:  Positive for sensory change, seizures and weakness.  All other systems reviewed and are negative.     Past Medical History:  Diagnosis Date   Medical history non-contributory           Past Surgical History:  Procedure Laterality Date   NO PAST SURGERIES             Family History  Problem Relation Age of Onset   Seizures Neg Hx      Social History:  reports that he has been smoking cigarettes. He has never used  smokeless tobacco. He reports current alcohol use. He reports that he does not use drugs. Allergies: No Known Allergies       Medications Prior to Admission  Medication Sig Dispense Refill   [EXPIRED] doxycycline (VIBRA-TABS) 100 MG tablet  Take 1 tablet (100 mg total) by mouth 2 (two) times daily for 7 days. 14 tablet 0   TYLENOL 500 MG tablet Take 500-1,000 mg by mouth every 6 (six) hours as needed (FOR HEADACHES).          Drug Regimen Review Drug regimen was reviewed and remains appropriate with no significant issues identified   Home: Home Living Family/patient expects to be discharged to:: Private residence Living Arrangements: Spouse/significant other Available Help at Discharge: Family, Available 24 hours/day Type of Home: Mobile home Home Access: Stairs to enter [employer] of Steps: 3 Entrance Stairs-Rails:  (grabbar at top and freezer beside stairs) Home Layout: One level Bathroom Shower/Tub: Engineer, manufacturing systems: Standard Bathroom Accessibility: No Home Equipment: None Additional Comments: 3 children (12/8/5 and wife homeschools)  Lives With: Spouse   Functional History: Prior Function Level of Independence: Independent 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[employer] Status:  Mobility: Bed Mobility Overal bed mobility: Needs Assistance Bed Mobility: Rolling, Sidelying to Sit Rolling: Mod assist Sidelying to sit: Mod assist, HOB elevated, +2 for safety/equipment Supine to sit: Max assist, HOB elevated Sit to supine: Max assist, +2 for safety/equipment, +2 for physical assistance General bed mobility comments: pt requires assistance for movement of LEs, able to utilize UE strength to elevate trunk into sitting. Pt with posterior loss of balance during initial attempt due to core weakness so second person guarding for safety Transfers Overall transfer level: Needs assistance Equipment used: None Transfers: Lateral/Scoot Transfers, Sit to/from Stand Sit to Stand: Total assist Stand pivot transfers: Total assist  Lateral/Scoot Transfers: Max assist, +2 safety/equipment General transfer comment: Cues for anterior trunk lean and hand placement. PT utilizing bed pad to maintain  anterior trunk lean and prevent posterior loss of balance due to impaired core strength, +2 for safety this session and totalA for BLE positioning while scooting. Pt performs partial sit to stand with PT knee block and BUE support of PT, aiding repositioning of bed pad.   ADL: ADL Overall ADL's : Needs assistance/impaired Eating/Feeding: Set up Eating/Feeding Details (indicate cue type and reason): assistance with packaging - pt is LHD, but able to self feed with R hand. May benefit from built up handle Grooming: Set up, Sitting Upper Body Bathing: Maximal assistance, Sitting Upper Body Bathing Details (indicate cue type and reason): assist for sitting balance and bathing Lower Body Bathing: Maximal assistance, Sitting/lateral leans, +2 for physical assistance, +2 for safety/equipment Upper Body Dressing : Maximal assistance, Sitting Lower Body Dressing: Total assistance, Sitting/lateral leans, +2 for physical assistance, +2 for safety/equipment Toilet Transfer: Total assistance, +2 for physical assistance, +2 for safety/equipment Toileting- Clothing Manipulation and Hygiene: Maximal assistance, Sitting/lateral lean Functional mobility during ADLs: +2 for physical assistance, +2 for safety/equipment General ADL Comments: assist for balance, paraparesis, impaired function of LUE and BLE   Cognition: Cognition Overall Cognitive Status: Within Functional Limits for tasks assessed Orientation Level: Oriented X4 Cognition Arousal/Alertness: Awake/alert Behavior During Therapy: WFL for tasks assessed/performed, Flat affect Overall Cognitive Status: Within Functional Limits for tasks assessed General Comments: Pt at times slow processing with word choice when seated EOB, although pt admits dizziness which may be distracting. Pt calm/cooperative.   Physical Exam: Blood pressure 115/77, pulse (!) 57, temperature  98 F (36.7 C), temperature source Oral, resp. rate 16, height 5\' 11"  (1.803 m),  weight 74.8 kg, SpO2 99 %. Physical Exam Constitutional:      General: He is not in acute distress.    Appearance: He is normal weight.  HENT:     Head: Normocephalic and atraumatic.     Nose: Nose normal.  Eyes:     Pupils: Pupils are equal, round, and reactive to light.  Cardiovascular:     Rate and Rhythm: Regular rhythm. Bradycardia present.     Heart sounds: No murmur heard.   No gallop.  Pulmonary:     Effort: Pulmonary effort is normal. No respiratory distress.     Breath sounds: No wheezing.  Abdominal:     General: There is no distension.     Palpations: Abdomen is soft.     Tenderness: There is no abdominal tenderness.  Musculoskeletal:        General: Normal range of motion.     Cervical back: Normal range of motion.  Skin:    General: Skin is warm.     Comments: Numerous tattoos. Quarter sized ulcer left heel  Neurological:     Mental Status: He is alert.     Comments: Patient is alert.  No acute distress and follows commands.  Makes eye contact with examiner.  Oriented to person place and time with fair awareness and insight. Dimished LT and pain around T5-6. Sensation more diminished in legs. UE motor 4+/5. LLE 1+ HE, KE, 1/5 APF, 0/5 ADF. RLE 0/5 prox to distal. No resting tone. DTR's trace to absent  Psychiatric:        Behavior: Behavior normal.        Thought Content: Thought content normal.        Judgment: Judgment normal.      Lab Results Last 48 Hours  No results found for this or any previous visit (from the past 48 hour(s)).   Imaging Results (Last 48 hours)  No results found.           Medical Problem List and Plan: 1.   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  Antibiotic course completed..  Follow-up with ID as outpatient with immunologic work-up and viral serology             -patient may  shower             -ELOS/Goals: 21-24 days, min assist at w/c level 2.  Antithrombotics: -DVT/anticoagulation:   Pharmaceutical: Lovenox.  Check vascular study             -antiplatelet therapy: N/A 3. Pain Management: Tylenol/Advil as needed 4. Mood: Provide emotional support             -antipsychotic agents: N/A 5. Neuropsych: This patient is capable of making decisions on his own behalf. 6. Skin/Wound Care: Routine skin checks             -needs to wear PRAFO's for heel protection 7. Fluids/Electrolytes/Nutrition: Routine in and outs with follow-up chemistries 8.  Seizure prophylaxis.  Keppra 500 mg twice daily 9.  Sinus bradycardia.  Echocardiogram unremarkable.  No further work-up indicated per cardiology services 10.  Neurogenic bladder/Urinary retention.  Failed voiding trial.               -maintain foley -Continue Flomax.  Repeat voiding trial as mobility improves 11. Neurogenic bowel: begin scheduled senokot-s at HS.  -miralax daily             -  consider bowel program             -pt reports no sense of bowel emptying right now.       Mcarthur Rossetti Angiulli, PA-C 04/05/2021   I have personally performed a face to face diagnostic evaluation of this patient and formulated the key components of the plan.  Additionally, I have personally reviewed laboratory data, imaging studies, as well as relevant notes and concur with the physician assistant's documentation above.  The patient's status has not changed from the original H&P.  Any changes in documentation from the acute care chart have been noted above.  Ranelle Oyster, MD, [place] [facility]

## 2021-04-06 NOTE — Discharge Instructions (Addendum)
Inpatient Rehab Discharge Instructions  [facility] Discharge date and time: No discharge date for patient encounter.   Activities/Precautions/ Functional Status: Activity: As tolerated Diet: Regular Wound Care: Routine skin checks Functional status:  ___ No restrictions     ___ Walk up steps independently ___ 24/7 supervision/assistance   ___ Walk up steps with assistance ___ Intermittent supervision/assistance  ___ Bathe/dress independently ___ Walk with walker     __x_ Bathe/dress with assistance ___ Walk Independently    ___ Shower independently ___ Walk with assistance    ___ Shower with assistance ___ No alcohol     ___ Return to work/school ________  COMMUNITY REFERRALS UPON DISCHARGE:     Outpatient: PT                 Agency: [facility]  Phone: [phone]              Appointment Date/Time: 04/26/2021  Medical Equipment/Items Ordered:Shower Chair, Bedside Commode                                                 Agency/Supplier: [employer]   Special Instructions: No driving smoking or alcohol  Follow-up infectious disease for immunologic and viral serology work-up  Local wound care and wear PRAFO's when in bed to maintain skin integrity of heels   My questions have been answered and I understand these instructions. I will adhere to these goals and the provided educational materials after my discharge from the hospital.  Patient/Caregiver Signature _______________________________ Date __________  Clinician Signature _______________________________________ Date __________  Please bring this form and your medication list with you to all your follow-up doctor's appointments.

## 2021-04-07 ENCOUNTER — Inpatient Hospital Stay (HOSPITAL_COMMUNITY): Payer: 59

## 2021-04-07 DIAGNOSIS — N319 Neuromuscular dysfunction of bladder, unspecified: Secondary | ICD-10-CM | POA: Diagnosis not present

## 2021-04-07 DIAGNOSIS — K592 Neurogenic bowel, not elsewhere classified: Secondary | ICD-10-CM | POA: Diagnosis not present

## 2021-04-07 DIAGNOSIS — G0490 Encephalitis and encephalomyelitis, unspecified: Secondary | ICD-10-CM

## 2021-04-07 DIAGNOSIS — G0491 Myelitis, unspecified: Secondary | ICD-10-CM

## 2021-04-07 MED ORDER — CHLORHEXIDINE GLUCONATE CLOTH 2 % EX PADS
6.0000 | MEDICATED_PAD | Freq: Two times a day (BID) | CUTANEOUS | Status: DC
  Administered 2021-04-07 – 2021-04-11 (×7): 6 via TOPICAL

## 2021-04-07 NOTE — Progress Notes (Signed)
Inpatient Rehabilitation Medication Review by a Pharmacist  A complete drug regimen review was completed for this patient to identify any potential clinically significant medication issues.  High Risk Drug Classes Is patient taking? Indication by Medication  Antipsychotic No   Anticoagulant Yes Enoxaparin 30 mg BID- DVT ppx  Antibiotic No   Opioid No   Antiplatelet No   Hypoglycemics/insulin No   Vasoactive Medication No   Chemotherapy No   Other Yes Keppra- seizure ppx      Type of Medication Issue Identified Description of Issue Recommendation(s)  Drug Interaction(s) (clinically significant)     Duplicate Therapy     Allergy     No Medication Administration End Date     Incorrect Dose  Current enoxaparin dose is 30 mg BID. 0.5 mg/kg BID is often used with obesity, however patient's BMI = 22 Prior to rehab admission, pt was receiving 40 mg daily inpatient If weight and indication as documented in the chart is correct, recommend adjusting dose to 40 mg SQ daily   Additional Drug Therapy Needed     Significant med changes from prior encounter (inform family/care partners about these prior to discharge).    Other       Clinically significant medication issues were identified that warrant physician communication and completion of prescribed/recommended actions by midnight of the next day:  Yes  Name of provider notified for urgent issues identified: [facility]  Provider Method of Notification: Secure chat    Pharmacist comments: none   Time spent performing this drug regimen review (minutes):  10  Rushie Goltz, Pharm.D. PGY-1 Pharmacy Resident Phone:[phone] 04/07/2021 6:51 AM

## 2021-04-07 NOTE — Progress Notes (Signed)
PROGRESS NOTE   Subjective/Complaints:    Objective:   No results found. Recent Labs    04/06/21 1841  WBC 8.8  HGB 14.7  HCT 44.8  PLT 296   Recent Labs    04/06/21 1841  CREATININE 1.00    Intake/Output Summary (Last 24 hours) at 04/07/2021 0739 Last data filed at 04/07/2021 0443 Gross per 24 hour  Intake 120 ml  Output 1700 ml  Net -1580 ml        Physical Exam: Vital Signs Blood pressure 120/78, pulse (!) 55, temperature (!) 97.5 F (36.4 C), temperature source Oral, resp. rate 18, height 5\' 11"  (1.803 m), weight 71.5 kg, SpO2 100 %. General: Alert and oriented x 3, No apparent distress HEENT: Head is normocephalic, atraumatic, PERRLA, EOMI, sclera anicteric, oral mucosa pink and moist, dentition intact, ext ear canals clear,  Neck: Supple without JVD or lymphadenopathy Heart: bradycardic.  No murmurs rubs or gallops Chest: CTA bilaterally without wheezes, rales, or rhonchi; no distress Abdomen: Soft, non-tender, non-distended, bowel sounds positive. Extremities: No clubbing, cyanosis, or edema. Pulses are 2+ Psych: Pt's affect is appropriate. Pt is cooperative Skin: numerous tats, left heel ulcer with foam dressing Neuro:  Alert and oriented x 3. Normal insight and awareness. Intact Memory. Normal language and speech. Cranial nerve exam unremarkable. Decreased LT/pain sense aroung T5-6 with legs more affected than trunk. UE 4+/5. LLE 1+ to 2- HE, KE, 1/5 APF, 0/5 ADF. RLE 0/5. No resting tone. DTR's trace Musculoskeletal: minimal pain with limb rom. Heel cords a little tight    Assessment/Plan: 1. Functional deficits which require 3+ hours per day of interdisciplinary therapy in a comprehensive inpatient rehab setting. Physiatrist is providing close team supervision and 24 hour management of active medical problems listed below. Physiatrist and rehab team continue to assess barriers to discharge/monitor  patient progress toward functional and medical goals  Care Tool:  Bathing              Bathing assist       Upper Body Dressing/Undressing Upper body dressing   What is the patient wearing?: Hospital gown only    Upper body assist      Lower Body Dressing/Undressing Lower body dressing      What is the patient wearing?: Hospital gown only     Lower body assist       Toileting Toileting    Toileting assist       Transfers Chair/bed transfer  Transfers assist           Locomotion Ambulation   Ambulation assist              Walk 10 feet activity   Assist           Walk 50 feet activity   Assist           Walk 150 feet activity   Assist           Walk 10 feet on uneven surface  activity   Assist           Wheelchair     Assist  Wheelchair 50 feet with 2 turns activity    Assist            Wheelchair 150 feet activity     Assist          Blood pressure 120/78, pulse (!) 55, temperature (!) 97.5 F (36.4 C), temperature source Oral, resp. rate 18, height 5\' 11"  (1.803 m), weight 71.5 kg, SpO2 100 %. Medical Problem List and Plan: 1.   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  Antibiotic course completed..  Follow-up with ID as outpatient with immunologic work-up and viral serology             -patient may  shower             -ELOS/Goals: 21-24 days, min assist at w/c level  Patient is beginning CIR therapies today including PT and OT   -demonstrated improvement in LLE motor this morning 2.  Antithrombotics: -DVT/anticoagulation:  Pharmaceutical: Lovenox.  Check vascular study             -antiplatelet therapy: N/A 3. Pain Management: Tylenol/Advil as needed 4. Mood: Provide emotional support             -antipsychotic agents: N/A 5. Neuropsych: This patient is capable of making decisions on his own behalf. 6. Skin/Wound Care: Routine skin  checks             -reiterated to pt that he needs to wear PRAFO's for heel protection and to stretch heel cords 7. Fluids/Electrolytes/Nutrition: Routine in and outs with follow-up chemistries 8.  Seizure prophylaxis.  Keppra 500 mg twice daily 9.  Sinus bradycardia.  Echocardiogram unremarkable.  No further work-up indicated per cardiology services 10.  Neurogenic bladder/Urinary retention.  Failed voiding trial.               -maintain foley for a few more days -Continue Flomax.  Repeat voiding trial as mobility improves 11. Neurogenic bowel: begin scheduled senokot-s at HS.             -miralax daily             -consider bowel program depending upon continence and regularity             -pt reports no sense of bowel emptying currently.       LOS: 1 days A FACE TO FACE EVALUATION WAS PERFORMED  04/07/2021, 7:39 AM

## 2021-04-07 NOTE — Evaluation (Signed)
Physical Therapy Assessment and Plan  Patient Details  Name: James Obrien MRN: [id number] Date of Birth: Jul 03, 1985  PT Diagnosis: Difficulty walking, Impaired sensation, Muscle weakness, and Paraplegia Rehab Potential: Good ELOS: ~3 weeks   Today's Date: 04/07/2021 PT Individual Time: 0017-4944 PT Individual Time Calculation (min): 64 min    Hospital Problem: Principal Problem:   Myelitis ([place]) Active Problems:   Encephalitis   Past Medical History:  Past Medical History:  Diagnosis Date   Medical history non-contributory    Past Surgical History:  Past Surgical History:  Procedure Laterality Date   NO PAST SURGERIES      Assessment & Plan Clinical Impression: Patient is a 35 year old right-handed male with unremarkable past medical history except for recent penile and scrotal lesions thought to be due to a spider bite as well as tobacco use on no prescription medications.  Per chart review patient lives with spouse.  1 level home 3 steps to entry.  3 children ages 12/8/5.  Independent prior to admission working Architect.  Presented 03/28/2021 with tingling and numbness of the left hand which quickly progressed to the lower extremities as well as a witnessed generalized tonic-clonic seizure that lasted 2-5 minutes.  Patient was loaded with Keppra.  EEG showed no seizure.  Cranial CT scan negative.  MRI showed multifocal abnormal hyperintense T2 weighted signal within both hemispheres but greatest at the right pre and postcentral gyri.  Appearance was concerning for acute demyelination.  MRI thoracic lumbar spine longitudinally extensive cord signal abnormality/myelitis.  No hematoma or other complications.  CT of the chest abdomen pelvis showed subcutaneous fat stranding left lower quadrant anterior abdominal wall and left inguinal region consistent with cellulitis.  No fluid collection or abscess.  Reactive lymphadenopathy within the pelvis and bilateral inguinal regions.   Admission chemistries unremarkable except creatinine 1.27, urine cultures no growth, blood cultures no growth to date, lactic acid 4.1, HIV antibody nonreactive, RPR nonreactive, GC/chlamydia probe positive for gonorrhea..  Neurology consulted lumbar puncture showed evidence of pleocytosis and CBC with elevated WBCC.  Consistent with myelitis, encephalitis.  Patient was initially placed on empiric acyclovir for possible encephalitis discontinued 10/4 with negative HSV.  ID did see the patient and bacterial antibiotics were added after neutrophilic CSF was seen in addition to blood cultures positive for methicillin sensitive Staphylococcus epidermidis.  He was treated with a lower dose of steroids to help reduce potential spinal cord swelling.  Patient's antibiotic course has been completed.  Hospital course patient did have some bradycardia with cardiology follow-up and echocardiogram completed with ejection fraction of 55 to 60% no wall motion abnormality and no further work-up was indicated.  Patient bouts of urinary retention and failed voiding trial with Foley tube in place and Flomax initiated.  Lovenox added for DVT prophylaxis.   Patient transferred to CIR on 04/06/2021 .   Patient currently requires max with mobility secondary to muscle weakness, decreased cardiorespiratoy endurance, decreased coordination, and decreased sitting balance, decreased standing balance, decreased postural control, and decreased balance strategies.  Prior to hospitalization, patient was independent  with mobility and lived with Spouse, Family in a House home.  Home access is 3Stairs to enter (plans to go to mother's house initially).  Patient will benefit from skilled PT intervention to maximize safe functional mobility, minimize fall risk, and decrease caregiver burden for planned discharge home with 24 hour supervision.  Anticipate patient will benefit from follow up [place] at discharge.  PT - End of Session Activity  Tolerance:  Tolerates 30+ min activity with multiple rests Endurance Deficit: Yes Endurance Deficit Description: fatigue mobility tasks PT Assessment Rehab Potential (ACUTE/IP ONLY): Good PT Barriers to Discharge: Home environment access/layout;Neurogenic Bowel & Bladder PT Patient demonstrates impairments in the following area(s): Balance;Endurance;Motor;Pain;Safety;Sensory PT Transfers Functional Problem(s): Bed Mobility;Bed to Chair;Car;Furniture PT Locomotion Functional Problem(s): Ambulation;Wheelchair Mobility;Stairs PT Plan PT Intensity: Minimum of 1-2 x/day ,45 to 90 minutes PT Frequency: 5 out of 7 days PT Duration Estimated Length of Stay: ~3 weeks PT Treatment/Interventions: Ambulation/gait training;Community reintegration;DME/adaptive equipment instruction;Neuromuscular re-education;Psychosocial support;Stair training;UE/LE Strength taining/ROM;Wheelchair propulsion/positioning;Balance/vestibular training;Discharge planning;Functional electrical stimulation;Pain management;Skin care/wound management;UE/LE Coordination activities;Therapeutic Activities;Cognitive remediation/compensation;Disease management/prevention;Functional mobility training;Patient/family education;Splinting/orthotics;Therapeutic Exercise PT Transfers Anticipated Outcome(s): Supervision PT Locomotion Anticipated Outcome(s): Supervision WC mobility PT Recommendation Recommendations for Other Services: Therapeutic Recreation consult Therapeutic Recreation Interventions: Outing/community reintergration;Stress management;Pet therapy Follow Up Recommendations: Home health PT;24 hour supervision/assistance Patient destination: Home Equipment Recommended: To be determined   PT Evaluation Precautions/Restrictions Precautions Precautions: Fall Precaution Comments: paraparesis; check BP Other Brace: TED hose Restrictions Weight Bearing Restrictions: No General Chart Reviewed: Yes Family/Caregiver Present: Yes   Pain Interference Pain Interference Pain Effect on Sleep: 1. Rarely or not at all Pain Interference with Therapy Activities: 2. Occasionally Pain Interference with Day-to-Day Activities: 2. Occasionally Home Living/Prior Functioning Home Living Available Help at Discharge: Family;Friend(s) Type of Home: House Home Access: Stairs to enter (plans to go to [employer] house initially) Technical brewer of Steps: 3 Entrance Stairs-Rails: None Home Layout: One level Biochemist, clinical: Standard Additional Comments: 3 children 12, 11 and 5 and wife homeschools  Lives With: Spouse;Family Prior Function Level of Independence: Independent with basic ADLs;Independent with gait;Independent with homemaking with ambulation;Independent with transfers  Able to Take Stairs?: Yes Driving: Yes Vocation: Full time employment Comments: works in Loss adjuster, chartered - History Ability to See in Adequate Light: 0 Adequate Perception Perception: Within Functional Limits Praxis Praxis: Intact  Cognition Overall Cognitive Status: Within Functional Limits for tasks assessed Arousal/Alertness: Awake/alert Orientation Level: Oriented X4 Year: 2022 Month: October Day of Week: Correct Memory: Appears intact Safety/Judgment: Appears intact Sensation Sensation Light Touch: Impaired by gross assessment Additional Comments: Correctly identifies light tough but reports "pins and needles" sensation. 50% sensation compared to upper extremities. Coordination Gross Motor Movements are Fluid and Coordinated: No Fine Motor Movements are Fluid and Coordinated: No Coordination and Movement Description: Gross weakness of core and bilateral lower extremities Motor  Motor Motor: Paraplegia;Abnormal postural alignment and control Motor - Skilled Clinical Observations: poor trunk control and lower body mobility  Trunk/Postural Assessment  Cervical Assessment Cervical Assessment: Within  Functional Limits Thoracic Assessment Thoracic Assessment: Within Functional Limits Lumbar Assessment Lumbar Assessment:  (posterior pelvic tilt) Postural Control Postural Control: Deficits on evaluation Trunk Control: requires max A to maintain sitting edge of bed  Balance Balance Balance Assessed: Yes Static Sitting Balance Static Sitting - Balance Support: Feet supported Static Sitting - Level of Assistance: 2: Max assist Dynamic Sitting Balance Dynamic Sitting - Balance Support: During functional activity Dynamic Sitting - Level of Assistance: 2: Max Insurance risk surveyor Standing - Balance Support: Bilateral upper extremity supported Static Standing - Level of Assistance: 1: +2 Total assist Extremity Assessment  RLE Assessment RLE Assessment: Exceptions to [facility] General Strength Comments: 2-/5 hip interna/external rotation and extension, 2-/5 knee extension, 0/5 distal to knee LLE Assessment LLE Assessment: Exceptions to [facility] General Strength Comments: 2/5 Hip internal/external rotation and extension, 2/5 knee extension, 0/5 distal to knee  Care Tool Care Tool Bed Mobility  Roll left and right activity   Roll left and right assist level: Moderate Assistance - Patient 50 - 74%    Sit to lying activity   Sit to lying assist level: 2 Helpers    Lying to sitting on side of bed activity   Lying to sitting on side of bed assist level: the ability to move from lying on the back to sitting on the side of the bed with no back support.: 2 Helpers     Care Tool Transfers Sit to stand transfer   Sit to stand assist level: 2 Helpers    Chair/bed transfer   Chair/bed transfer assist level: 2 Helpers Chair/bed transfer assistive device: Sliding board   Toilet transfer Toilet transfer activity did not occur: Safety/medical Personal assistant transfer activity did not occur: Safety/medical concerns        Care Tool Locomotion Ambulation Ambulation  activity did not occur: Safety/medical concerns        Walk 10 feet activity Walk 10 feet activity did not occur: Safety/medical concerns       Walk 50 feet with 2 turns activity Walk 50 feet with 2 turns activity did not occur: Safety/medical concerns      Walk 150 feet activity Walk 150 feet activity did not occur: Safety/medical concerns      Walk 10 feet on uneven surfaces activity Walk 10 feet on uneven surfaces activity did not occur: Safety/medical concerns      Stairs Stair activity did not occur: Safety/medical concerns        Walk up/down 1 step activity Walk up/down 1 step or curb (drop down) activity did not occur: Safety/medical concerns     Walk up/down 4 steps activity did not occuR: Safety/medical concerns  Walk up/down 4 steps activity      Walk up/down 12 steps activity Walk up/down 12 steps activity did not occur: Safety/medical concerns      Pick up small objects from floor Pick up small object from the floor (from standing position) activity did not occur: Safety/medical concerns      Wheelchair Is the patient using a wheelchair?: Yes Type of Wheelchair: Manual   Wheelchair assist level: Supervision/Verbal cueing Max wheelchair distance: 16'  Wheel 50 feet with 2 turns activity   Assist Level: Supervision/Verbal cueing  Wheel 150 feet activity   Assist Level: Minimal Assistance - Patient > 75%    Refer to Care Plan for Long Term Goals  SHORT TERM GOAL WEEK 1 PT Short Term Goal 1 (Week 1): Pt will perform bed mobility with modA +1. PT Short Term Goal 2 (Week 1): Pt will perform bed to chair transfer with modA +1. PT Short Term Goal 3 (Week 1): Pt will maintain static sitting balance for 5 minutes with close supervision.  Recommendations for other services: Therapeutic Recreation  Pet therapy, Stress management, and Outing/community reintegration  Skilled Therapeutic Intervention  Evaluation completed (see details above and below) with  education on PT POC and goals and individual treatment initiated with focus on bed mobility, balance, and functional transfers.  Pt received supine in bed and agrees to therapy. Reports pain in inferior chest/superior abdominal area, bilaterally and seeming superficial. Number not provided. PT provides rest breaks and repositioning to manage pain. Supine to sit with bed features and maxA +2 and cues for sequencing and positioning at EOB for safety. Pt sits at EOB with minA/modA and use of bilateral upper extremities. Tendency to los balance  posteriorly so tech positioned behind pt. Pt educated on head-hip relationship and imporance of limiting posterior bias for functional moblity and balance. Pt performs sliding board transfer from elevated bed to [facility] with modA +2, with multimodal cues for body mechanics, sequencing, and positioning. WC transport to gym for time management. Sliding board transfer from [facility] to mat table with same assist level but pt with improved ability to place own sliding board. Cues also to use flat palm on sliding board for safety. Pt sits at edge of mat with minA/modA primarily, with occasional bouts of CGA when using optimal body mechanics and posture, with bilateral upper extremity support and mirror for visual feedback. Pt performs sit to stand with totalA +2, with bilateral HHA, and manual facilitation of hip and trunk extension with bilateral knees blocked. In standing, verbal and tactile cues provided for activation of glutes with pt able to make noticeable improvement in standing posture once activated. Pt verbalizes increased pain in upper abdomen after 10-15 seconds of standing. Return to seated position for pain relief. Sit to supine with maxA +2 and cues for sequencing. Pt attempts heel slides but is unable to move without totalA. Pt assisted into hooklying and performs x10 bridges. Unable to clear buttocks from table but with notable glute and core activation. Supine to sit with  maxA +2. Pt practices lateral scooting at edge of mat with modA for trunk control and cues for body mechanics. Pt then perform 2x5 seated anterior rocks with cue to lift buttocks form table as high as possible. Performed for transfer training and Chiropodist. Sliding board transfer from mat>WC>bed with modA +2. Sit to supine with maxA +2. Left with alarm intact and all needs within reach.  Mobility Bed Mobility Bed Mobility: Rolling Right;Rolling Left;Sit to Supine;Right Sidelying to Sit;Supine to Sit Rolling Right: Moderate Assistance - Patient 50-74% Rolling Left: Moderate Assistance - Patient 50-74% Right Sidelying to Sit: 2 Helpers Supine to Sit: 2 Helpers Sit to Supine: 2 Helpers Transfers Transfers: Sit to Stand;Stand to Sit;Lateral/Scoot Transfers Sit to Stand: 2 Helpers;Total Assistance - Patient < 25% Stand to Sit: Total Assistance - Patient < 25%;2 Helpers Lateral/Scoot Transfers: Moderate Assistance - Patient 50-74%;2 Helpers (sliding board) Transfer (Assistive device): 2 person hand held assist Locomotion  Gait Ambulation: No Gait Gait: No Stairs / Additional Locomotion Stairs: No Wheelchair Mobility Wheelchair Mobility: Yes Wheelchair Assistance: Chartered loss adjuster: Both upper extremities Wheelchair Parts Management: Needs assistance Distance: 17'   Discharge Criteria: Patient will be discharged from PT if patient refuses treatment 3 consecutive times without medical reason, if treatment goals not met, if there is a change in medical status, if patient makes no progress towards goals or if patient is discharged from hospital.  The above assessment, treatment plan, treatment alternatives and goals were discussed and mutually agreed upon: by patient  Breck Coons, PT, DPT 04/07/2021, 3:52 PM

## 2021-04-07 NOTE — Progress Notes (Signed)
Bilateral lower extremity venous duplex completed. Refer to "CV Proc" under chart review to view preliminary results.  04/07/2021 11:36 AM Eula Fried., MHA, RVT, RDCS, RDMS

## 2021-04-07 NOTE — Evaluation (Signed)
Occupational Therapy Assessment and Plan  Patient Details  Name: James Obrien MRN: [id number] Date of Birth: [birth year]  OT Diagnosis: abnormal posture, muscle weakness (generalized), and paraplegia  Rehab Potential:   ELOS: 2-3 weeks   Today's Date: 04/07/2021 OT Individual Time: 0601-5615 OT Individual Time Calculation (min): 64 min     Hospital Problem: Principal Problem:   Myelitis ([place]) Active Problems:   Encephalitis   Past Medical History:  Past Medical History:  Diagnosis Date   Medical history non-contributory    Past Surgical History:  Past Surgical History:  Procedure Laterality Date   NO PAST SURGERIES      Assessment & Plan Clinical Impression: Patient is a 35 y.o. year old male with unremarkable past medical history except for recent penile and scrotal lesions thought to be due to a spider bite as well as tobacco use on no prescription medications.  Per chart review patient lives with spouse.  1 level home 3 steps to entry.  3 children ages 12/8/5.  Independent prior to admission working Architect.  Presented 03/28/2021 with tingling and numbness of the left hand which quickly progressed to the lower extremities as well as a witnessed generalized tonic-clonic seizure that lasted 2-5 minutes.  Patient was loaded with Keppra.  EEG showed no seizure.  Cranial CT scan negative.  MRI showed multifocal abnormal hyperintense T2 weighted signal within both hemispheres but greatest at the right pre and postcentral gyri.  Appearance was concerning for acute demyelination.  MRI thoracic lumbar spine longitudinally extensive cord signal abnormality/myelitis.  No hematoma or other complications.  CT of the chest abdomen pelvis showed subcutaneous fat stranding left lower quadrant anterior abdominal wall and left inguinal region consistent with cellulitis.  No fluid collection or abscess.  Reactive lymphadenopathy within the pelvis and bilateral inguinal regions.  Admission  chemistries unremarkable except creatinine 1.27, urine cultures no growth, blood cultures no growth to date, lactic acid 4.1, HIV antibody nonreactive, RPR nonreactive, GC/chlamydia probe positive for gonorrhea..  Neurology consulted lumbar puncture showed evidence of pleocytosis and CBC with elevated WBCC.  Consistent with myelitis, encephalitis.  Patient was initially placed on empiric acyclovir for possible encephalitis discontinued 10/4 with negative HSV.  ID did see the patient and bacterial antibiotics were added after neutrophilic CSF was seen in addition to blood cultures positive for methicillin sensitive Staphylococcus epidermidis.  He was treated with a lower dose of steroids to help reduce potential spinal cord swelling.  Patient's antibiotic course has been completed.  Hospital course patient did have some bradycardia with cardiology follow-up and echocardiogram completed with ejection fraction of 55 to 60% no wall motion abnormality and no further work-up was indicated.  Patient bouts of urinary retention and failed voiding trial with Foley tube in place and Flomax initiated.  Lovenox added for DVT prophylaxis.  Patient transferred to CIR on 04/06/2021 .    Patient currently requires max with basic self-care skills secondary to muscle weakness, muscle joint tightness, and muscle paralysis, decreased cardiorespiratoy endurance, abnormal tone, unbalanced muscle activation, decreased coordination, and decreased motor planning, and decreased sitting balance, decreased standing balance, and decreased postural control.  Prior to hospitalization, patient could complete ADL/IADL with independent .  Patient will benefit from skilled intervention to decrease level of assist with basic self-care skills, increase independence with basic self-care skills, and increase level of independence with iADL prior to discharge home with care partner.  Anticipate patient will require minimal physical assistance and follow  up home health.  OT -  End of Session Activity Tolerance: Tolerates 10 - 20 min activity with multiple rests Endurance Deficit: Yes Endurance Deficit Description: fatigue with adl and sitting unsupported OT Assessment OT Patient demonstrates impairments in the following area(s): Balance;Endurance;Motor;Sensory;Skin Integrity OT Basic ADL's Functional Problem(s): Grooming;Bathing;Dressing;Toileting OT Advanced ADL's Functional Problem(s): Simple Meal Preparation OT Transfers Functional Problem(s): Toilet;Tub/Shower OT Additional Impairment(s): None OT Plan OT Intensity: Minimum of 1-2 x/day, 45 to 90 minutes OT Frequency: 5 out of 7 days OT Duration/Estimated Length of Stay: 2-3 weeks OT Treatment/Interventions: Balance/vestibular training;Neuromuscular re-education;Self Care/advanced ADL retraining;Therapeutic Exercise;Wheelchair propulsion/positioning;DME/adaptive equipment instruction;Skin care/wound managment;UE/LE Strength taining/ROM;Community reintegration;Patient/family education;UE/LE Coordination activities;Discharge planning;Functional mobility training;Psychosocial support;Therapeutic Activities OT Self Feeding Anticipated Outcome(s): independent OT Basic Self-Care Anticipated Outcome(s): min a OT Toileting Anticipated Outcome(s): min a OT Bathroom Transfers Anticipated Outcome(s): min a OT Recommendation Recommendations for Other Services: Neuropsych consult Patient destination: Home Follow Up Recommendations: Home health OT Equipment Recommended: Tub/shower bench;Sliding board;Other (comment) (drop arm commode)   OT Evaluation Precautions/Restrictions  Precautions Precautions: Fall Precaution Comments: paraparesis; check BP Restrictions Weight Bearing Restrictions: No  Pain Pain Assessment Pain Scale: 0-10 Pain Score: 3  Pain Type: Acute pain Pain Location: Rib cage Pain Orientation: Right;Left Pain Descriptors / Indicators: Burning Pain Onset: Gradual Pain  Intervention(s): Repositioned Home Living/Prior Functioning Home Living Available Help at Discharge: Family, Friend(s) Type of Home: House (plans to go to mothers house initially,) Home Access: Stairs to enter Technical brewer of Steps: 3 Entrance Stairs-Rails: None Home Layout: One level Bathroom Shower/Tub: Tub/shower unit Additional Comments: 3 children 12, 11 and 5 and wife homeschools  Lives With: Spouse, Family Prior Function Level of Independence: Independent with basic ADLs, Independent with gait, Independent with homemaking with ambulation, Independent with transfers  Able to Take Stairs?: Yes Driving: Yes Vocation: Full time employment Comments: works in Education officer, community Baseline Vision/History: 0 No visual deficits Ability to See in Adequate Light: 0 Adequate Patient Visual Report: No change from baseline Vision Assessment?: No apparent visual deficits Perception  Perception: Within Functional Limits Praxis Praxis: Intact Cognition Overall Cognitive Status: Within Functional Limits for tasks assessed Arousal/Alertness: Awake/alert Orientation Level: Person;Place;Situation Person: Oriented Place: Oriented Situation: Oriented Year: 2022 Month: October Day of Week: Correct Memory: Appears intact Immediate Memory Recall: Sock;Blue;Bed Memory Recall Sock: Without Cue Memory Recall Blue: Without Cue Memory Recall Bed: Without Cue Awareness: Appears intact Problem Solving: Appears intact Safety/Judgment: Appears intact Sensation Sensation Additional Comments: patient notes improving left hand sensory deficit, c/o pins and needles, able to feel light touch, hot/cold Coordination Gross Motor Movements are Fluid and Coordinated: No Fine Motor Movements are Fluid and Coordinated: No Finger Nose Finger Test: [facility] 9 Hole Peg Test: tba, patient notes impiared dexterity with [facility] tasks during adl Motor  Motor Motor: Paraplegia;Abnormal postural alignment and  control Motor - Skilled Clinical Observations: poor trunk control and lower body mobility  Trunk/Postural Assessment  Postural Control Postural Control: Deficits on evaluation Trunk Control: requires max A to maintain sitting edge of bed  Balance Balance Balance Assessed: Yes Static Sitting Balance Static Sitting - Balance Support: Feet supported Static Sitting - Level of Assistance: 2: Max assist Dynamic Sitting Balance Dynamic Sitting - Balance Support: During functional activity Dynamic Sitting - Level of Assistance: 2: Max assist Extremity/Trunk Assessment RUE Assessment RUE Assessment: Within Functional Limits General Strength Comments: 5/5 LUE Assessment Active Range of Motion (AROM) Comments: WFL General Strength Comments: patient notes tightness in flexors and coordination deficit, grossly 4+/5  Care Tool Care Tool Self Care Eating  Eating Assist Level: Set up assist    Oral Care    Oral Care Assist Level: Set up assist    Bathing   Body parts bathed by patient: Right arm;Left arm;Chest;Abdomen;Front perineal area;Right upper leg;Left upper leg;Face Body parts bathed by helper: Buttocks;Right lower leg;Left lower leg   Assist Level: Moderate Assistance - Patient 50 - 74%    Upper Body Dressing(including orthotics)   What is the patient wearing?: Pull over shirt   Assist Level: Minimal Assistance - Patient > 75%    Lower Body Dressing (excluding footwear)   What is the patient wearing?: Pants;Incontinence brief Assist for lower body dressing: Total Assistance - Patient < 25%    Putting on/Taking off footwear   What is the patient wearing?: Non-skid slipper socks;Ted hose Assist for footwear: Total Assistance - Patient < 25%       Care Tool Toileting Toileting activity   Assist for toileting: Total Assistance - Patient < 25%     Care Tool Bed Mobility Roll left and right activity   Roll left and right assist level: Supervision/Verbal cueing    Sit to  lying activity   Sit to lying assist level: 2 Helpers    Lying to sitting on side of bed activity   Lying to sitting on side of bed assist level: the ability to move from lying on the back to sitting on the side of the bed with no back support.: 2 Helpers     Care Tool Transfers Sit to stand transfer        Chair/bed transfer         Toilet transfer Toilet transfer activity did not occur: Safety/medical concerns       Care Tool Cognition  Expression of Ideas and Wants Expression of Ideas and Wants: 4. Without difficulty (complex and basic) - expresses complex messages without difficulty and with speech that is clear and easy to understand  Understanding Verbal and Non-Verbal Content Understanding Verbal and Non-Verbal Content: 4. Understands (complex and basic) - clear comprehension without cues or repetitions   Memory/Recall Ability Memory/Recall Ability : Current season;That he or she is in a hospital/hospital unit   Refer to Care Plan for [place] 1 OT Short Term Goal 1 (Week 1): patient will complete lower body bathing dressing at bed level with min A using assistive devices OT Short Term Goal 2 (Week 1): patient will complete upper body bathing and dressing w/c level with set up OT Short Term Goal 3 (Week 1): patient will sit unsupported for 10+ minutes with min A OT Short Term Goal 4 (Week 1): patient will complete SB transfers with mod A of one  Recommendations for other services: Neuropsych   Skilled Therapeutic Intervention   Patient alert and eager to participate in therapy evaluation and programming.   After initial evaluation as documented above, engaged patient in adl and mobility training.  Focused session on trunk control, positioning for optimal function while working toward improved strength.  Reviewed positioning, skin care and strategies to maintain flexibility.  Discussed assistive devices and will begin training in upcoming  sessions.   Patient eager to improve sitting balance and mobility.  He remained in bed at close of session, bed alarm set and call bell in reach.   ADL ADL Eating: Set up Where Assessed-Eating: Bed level Grooming: Setup Where Assessed-Grooming: Edge of bed Upper Body Bathing: Setup Where Assessed-Upper Body Bathing: Edge of bed Lower Body Bathing: Maximal  assistance Where Assessed-Lower Body Bathing: Bed level Upper Body Dressing: Minimal assistance Where Assessed-Upper Body Dressing: Edge of bed Lower Body Dressing: Maximal assistance Where Assessed-Lower Body Dressing: Bed level Toileting: Not assessed Where Assessed-Toileting: Bed level Toilet Transfer: Not assessed ADL Comments: requires max A to maintain sitting edge of bed for grooming and upper body adl Mobility  Bed Mobility Bed Mobility: Rolling Right;Rolling Left;Right Sidelying to Sit;Sit to Supine Rolling Right: Supervision/verbal cueing Rolling Left: Supervision/Verbal cueing Right Sidelying to Sit: 2 Helpers Sit to Supine: 2 Helpers   Discharge Criteria: Patient will be discharged from OT if patient refuses treatment 3 consecutive times without medical reason, if treatment goals not met, if there is a change in medical status, if patient makes no progress towards goals or if patient is discharged from hospital.  The above assessment, treatment plan, treatment alternatives and goals were discussed and mutually agreed upon: by patient  Carlos Levering 04/07/2021, 9:35 AM

## 2021-04-08 DIAGNOSIS — G0491 Myelitis, unspecified: Secondary | ICD-10-CM | POA: Diagnosis not present

## 2021-04-08 LAB — COMPREHENSIVE METABOLIC PANEL
ALT: 63 U/L — ABNORMAL HIGH (ref 0–44)
AST: 32 U/L (ref 15–41)
Albumin: 3.5 g/dL (ref 3.5–5.0)
Alkaline Phosphatase: 38 U/L (ref 38–126)
Anion gap: 9 (ref 5–15)
BUN: 27 mg/dL — ABNORMAL HIGH (ref 6–20)
CO2: 26 mmol/L (ref 22–32)
Calcium: 9.3 mg/dL (ref 8.9–10.3)
Chloride: 99 mmol/L (ref 98–111)
Creatinine, Ser: 1 mg/dL (ref 0.61–1.24)
GFR, Estimated: >60 mL/min (ref >60)
Glucose, Bld: 104 mg/dL — ABNORMAL HIGH (ref 70–99)
Potassium: 4.3 mmol/L (ref 3.5–5.1)
Sodium: 134 mmol/L — ABNORMAL LOW (ref 135–145)
Total Bilirubin: 1.1 mg/dL (ref 0.3–1.2)
Total Protein: 7.3 g/dL (ref 6.5–8.1)

## 2021-04-08 LAB — CBC WITH DIFFERENTIAL/PLATELET
Abs Immature Granulocytes: 0.04 10*3/uL (ref 0.00–0.07)
Basophils Absolute: 0.1 10*3/uL (ref 0.0–0.1)
Basophils Relative: 1 %
Eosinophils Absolute: 0.1 10*3/uL (ref 0.0–0.5)
Eosinophils Relative: 2 %
HCT: 42.8 % (ref 39.0–52.0)
Hemoglobin: 14.6 g/dL (ref 13.0–17.0)
Immature Granulocytes: 1 %
Lymphocytes Relative: 31 %
Lymphs Abs: 1.8 10*3/uL (ref 0.7–4.0)
MCH: 31.4 pg (ref 26.0–34.0)
MCHC: 34.1 g/dL (ref 30.0–36.0)
MCV: 92 fL (ref 80.0–100.0)
Monocytes Absolute: 0.7 10*3/uL (ref 0.1–1.0)
Monocytes Relative: 12 %
Neutro Abs: 3.2 10*3/uL (ref 1.7–7.7)
Neutrophils Relative %: 53 %
Platelets: 272 10*3/uL (ref 150–400)
RBC: 4.65 MIL/uL (ref 4.22–5.81)
RDW: 14.3 % (ref 11.5–15.5)
WBC: 5.9 10*3/uL (ref 4.0–10.5)
nRBC: 0 % (ref 0.0–0.2)

## 2021-04-08 MED ORDER — LIDOCAINE 5 % EX PTCH
3.0000 | MEDICATED_PATCH | CUTANEOUS | Status: DC
  Administered 2021-04-08 – 2021-04-24 (×5): 3 via TRANSDERMAL
  Filled 2021-04-08 (×15): qty 3

## 2021-04-08 MED ORDER — SORBITOL 70 % SOLN
30.0000 mL | Freq: Once | Status: AC
  Administered 2021-04-08: 30 mL via ORAL
  Filled 2021-04-08: qty 30

## 2021-04-08 NOTE — Progress Notes (Signed)
Inpatient Rehabilitation Center Individual Statement of Services  Patient Name:  James Obrien  Date:  04/08/2021  Welcome to the Inpatient Rehabilitation Center.  Our goal is to provide you with an individualized program based on your diagnosis and situation, designed to meet your specific needs.  With this comprehensive rehabilitation program, you will be expected to participate in at least 3 hours of rehabilitation therapies Monday-Friday, with modified therapy programming on the weekends.  Your rehabilitation program will include the following services:  Physical Therapy (PT), Occupational Therapy (OT), Speech Therapy (ST), 24 hour per day rehabilitation nursing, Therapeutic Recreaction (TR), Neuropsychology, Care Coordinator, Rehabilitation Medicine, Nutrition Services, Pharmacy Services, and Other  Weekly team conferences will be held on Tuesdays to discuss your progress.  Your Inpatient Rehabilitation Care Coordinator will talk with you frequently to get your input and to update you on team discussions.  Team conferences with you and your family in attendance may also be held.  Expected length of stay:  21-24 Days  Overall anticipated outcome:  Min A  Depending on your progress and recovery, your program may change. Your Inpatient Rehabilitation Care Coordinator will coordinate services and will keep you informed of any changes. Your Inpatient Rehabilitation Care Coordinator's name and contact numbers are listed  below.  The following services may also be recommended but are not provided by the Inpatient Rehabilitation Center:   Home Health Rehabiltiation Services Outpatient Rehabilitation Services     Arrangements will be made to provide these services after discharge if needed.  Arrangements include referral to agencies that provide these services.  Your insurance has been verified to be:  Research scientist (life sciences)  Your primary doctor is:  NO PCP  Pertinent information will be shared  with your doctor and your insurance company.  Inpatient Rehabilitation Care Coordinator:  [name], [place] [phone] or [phone]  Information discussed with and copy given to patient by: Andria Rhein, 04/08/2021, 10:53 AM

## 2021-04-08 NOTE — Progress Notes (Signed)
Inpatient Rehabilitation Care Coordinator Assessment and Plan Patient Details  Name: James Obrien MRN: [id number] Date of Birth: 1985-08-11  Today's Date: 04/08/2021  Hospital Problems: Principal Problem:   Myelitis [facility]) Active Problems:   Encephalitis  Past Medical History:  Past Medical History:  Diagnosis Date   Medical history non-contributory    Past Surgical History:  Past Surgical History:  Procedure Laterality Date   NO PAST SURGERIES     Social History:  reports that he has been smoking cigarettes. He has never used smokeless tobacco. He reports current alcohol use. He reports that he does not use drugs.  Family / Support Systems Spouse/Significant Other: [name] Other Supports: Theresia Lo and [place] (Friend) Ability/Limitations of Caregiver: spouse, mother Caregiver Availability: 24/7 Family Dynamics: support from  familu (spouse, mother, children and friends)  Social History Preferred language: English Religion:  Health Literacy - How often do you need to have someone help you when you read instructions, pamphlets, or other written material from your doctor or pharmacy?: Never Employment Status: Employed Public relations account executive Issues: n/a Guardian/Conservator: n/a   Abuse/Neglect Abuse/Neglect Assessment Can Be Completed: Yes Physical Abuse: Denies Verbal Abuse: Denies Sexual Abuse: Denies Exploitation of patient/patient's resources: Denies Self-Neglect: Denies  Patient response to: Social Isolation - How often do you feel lonely or isolated from those around you?: Never  Emotional Status Recent Psychosocial Issues: n/a Psychiatric History: n/a Substance Abuse History: tobacco  Patient / Family Perceptions, Expectations & Goals Pt/Family understanding of illness & functional limitations: yes Premorbid pt/family roles/activities: previosuly independent and working Anticipated changes in roles/activities/participation: spouse, mother and  family to asissit pt at home Pt/family expectations/goals: [employer]: None Premorbid Home Care/DME Agencies: None Transportation available at discharge: pt drove. Family able to transport Is the patient able to respond to transportation needs?: Yes In the past 12 months, has lack of transportation kept you from medical appointments or from getting medications?: No In the past 12 months, has lack of transportation kept you from meetings, work, or from getting things needed for daily living?: No Resource referrals recommended: Neuropsychology  Discharge Planning Living Arrangements: Spouse/significant other Support Systems: Spouse/significant other, Friends/neighbors, Children, Parent (11, 12, 5) Type of Residence: Private residence (d/c to mothers home) Insurance Resources: Multimedia programmer (specify) Psychologist, counselling) Financial Resources: Employment Museum/gallery curator Screen Referred: No Living Expenses: Lives with family Money Management: Patient, Spouse Does the patient have any problems obtaining your medications?: No Home Management: Independent Patient/Family Preliminary Plans: spouse and family able to assist if needed Care Coordinator Barriers to Discharge: Insurance for SNF coverage Care Coordinator Anticipated Follow Up Needs: HH/OP Expected length of stay: 21-24 Days  Clinical Impression SW met with patient, spouse in shower. Introduced self, explained role and addressed questions and concerns. Pt plans to d/c to mothers home with assistance from mother, spouse, children and friends. No additional questions or concerns, sw will cont to follow up  [name] 04/08/2021, 1:54 PM

## 2021-04-08 NOTE — Progress Notes (Signed)
PROGRESS NOTE   Subjective/Complaints:  Pt reports still sore around chest- tylenol/ibuprofen doesn't help much- and feels like hard to  catch breath due to pain.   Very small BM s 2-4 days ago, but otherwise not since admission.   Also itchy in rib area as well.    ROS:  Pt denies SOB, abd pain, CP, N/V/C/D, and vision changes   Objective:   VAS [place] LOWER EXTREMITY VENOUS (DVT)  Result Date: 04/08/2021  Lower Venous DVT Study Patient Name:  James Obrien  Date of Exam:   04/07/2021 Medical Rec #: [id number]            Accession #:    [id number] Date of Birth: 1985/10/27             Patient Gender: M Patient Age:   35 years Exam Location:  [facility] Procedure:      VAS [place] LOWER EXTREMITY VENOUS (DVT) Referring Phys: Mariam Dollar --------------------------------------------------------------------------------  Other Indications: Encephalitis and myelitis, limited mobility, in inpatient                    rehabiliation. Limitations: Poor ultrasound/tissue interface and restricted mobility. Comparison Study: No prior study Performing Technologist: Gertie Fey MHA, RDMS, RVT, RDCS  Examination Guidelines: A complete evaluation includes B-mode imaging, spectral Doppler, color Doppler, and power Doppler as needed of all accessible portions of each vessel. Bilateral testing is considered an integral part of a complete examination. Limited examinations for reoccurring indications may be performed as noted. The reflux portion of the exam is performed with the patient in reverse Trendelenburg.  +---------+---------------+---------+-----------+----------+--------------+ RIGHT    CompressibilityPhasicitySpontaneityPropertiesThrombus Aging +---------+---------------+---------+-----------+----------+--------------+ CFV      Full           Yes      Yes                                  +---------+---------------+---------+-----------+----------+--------------+ SFJ      Full                                                        +---------+---------------+---------+-----------+----------+--------------+ FV Prox  Full                                                        +---------+---------------+---------+-----------+----------+--------------+ FV Mid   Full                                                        +---------+---------------+---------+-----------+----------+--------------+ FV DistalFull                                                        +---------+---------------+---------+-----------+----------+--------------+  PFV      Full                                                        +---------+---------------+---------+-----------+----------+--------------+ POP      Full           Yes      Yes                                 +---------+---------------+---------+-----------+----------+--------------+ PTV      Full                                                        +---------+---------------+---------+-----------+----------+--------------+ PERO     Full                                                        +---------+---------------+---------+-----------+----------+--------------+   +---------+---------------+---------+-----------+----------+--------------+ LEFT     CompressibilityPhasicitySpontaneityPropertiesThrombus Aging +---------+---------------+---------+-----------+----------+--------------+ CFV      Full           Yes      Yes                                 +---------+---------------+---------+-----------+----------+--------------+ SFJ      Full                                                        +---------+---------------+---------+-----------+----------+--------------+ FV Prox  Full                                                         +---------+---------------+---------+-----------+----------+--------------+ FV Mid   Full                                                        +---------+---------------+---------+-----------+----------+--------------+ FV DistalFull                                                        +---------+---------------+---------+-----------+----------+--------------+ PFV      Full                                                        +---------+---------------+---------+-----------+----------+--------------+  POP      Full           Yes      Yes                                 +---------+---------------+---------+-----------+----------+--------------+ PTV      Full                                                        +---------+---------------+---------+-----------+----------+--------------+ PERO     Full                                                        +---------+---------------+---------+-----------+----------+--------------+     Summary: BILATERAL: - No evidence of deep vein thrombosis seen in the lower extremities, bilaterally. -No evidence of popliteal cyst, bilaterally.   *See table(s) above for measurements and observations. Electronically signed by Gerarda Fraction on 04/08/2021 at 6:49:56 AM.    Final    Recent Labs    04/06/21 1841 04/08/21 0620  WBC 8.8 5.9  HGB 14.7 14.6  HCT 44.8 42.8  PLT 296 272   Recent Labs    04/06/21 1841 04/08/21 0620  NA  --  134*  K  --  4.3  CL  --  99  CO2  --  26  GLUCOSE  --  104*  BUN  --  27*  CREATININE 1.00 1.00  CALCIUM  --  9.3    Intake/Output Summary (Last 24 hours) at 04/08/2021 0918 Last data filed at 04/07/2021 1341 Gross per 24 hour  Intake 118 ml  Output --  Net 118 ml        Physical Exam: Vital Signs Blood pressure 112/64, pulse (!) 56, temperature 97.9 F (36.6 C), resp. rate 17, height 5\' 11"  (1.803 m), weight 71.5 kg, SpO2 98 %.    General: awake, alert, appropriate, but  asleep initially- wife at bedside; c/o rib pain; NAD HENT: conjugate gaze; oropharynx moist CV: regular rhythm, but bradycardic  rate; no JVD Pulmonary: CTA B/L; no W/R/R- good air movement- no bruising on ribs seen where painful-  GI: soft, NT, ND, (+)BS Psychiatric: appropriate Neurological: Ox3 but sleepy  Extremities: No clubbing, cyanosis, or edema. Pulses are 2+ Skin: numerous tats, left heel ulcer with foam dressing Neuro:  Alert and oriented x 3. Normal insight and awareness. Intact Memory. Normal language and speech. Cranial nerve exam unremarkable. Decreased LT/pain sense aroung T5-6 with legs more affected than trunk. UE 4+/5. LLE 1+ to 2- HE, KE, 1/5 APF, 0/5 ADF. RLE 0/5. No resting tone. DTR's trace Musculoskeletal: minimal pain with limb rom. Heel cords a little tight    Assessment/Plan: 1. Functional deficits which require 3+ hours per day of interdisciplinary therapy in a comprehensive inpatient rehab setting. Physiatrist is providing close team supervision and 24 hour management of active medical problems listed below. Physiatrist and rehab team continue to assess barriers to discharge/monitor patient progress toward functional and medical goals  Care Tool:  Bathing    Body parts bathed by patient: Right arm, Left arm, Chest, Abdomen, Front perineal area,  Right upper leg, Left upper leg, Face   Body parts bathed by helper: Buttocks, Right lower leg, Left lower leg     Bathing assist Assist Level: Moderate Assistance - Patient 50 - 74%     Upper Body Dressing/Undressing Upper body dressing   What is the patient wearing?: Pull over shirt    Upper body assist Assist Level: Minimal Assistance - Patient > 75%    Lower Body Dressing/Undressing Lower body dressing      What is the patient wearing?: Pants, Incontinence brief     Lower body assist Assist for lower body dressing: Total Assistance - Patient < 25%     Toileting Toileting    Toileting assist  Assist for toileting: Total Assistance - Patient < 25%     Transfers Chair/bed transfer  Transfers assist     Chair/bed transfer assist level: 2 Helpers Chair/bed transfer assistive device: Sliding board   Locomotion Ambulation   Ambulation assist   Ambulation activity did not occur: Safety/medical concerns          Walk 10 feet activity   Assist  Walk 10 feet activity did not occur: Safety/medical concerns        Walk 50 feet activity   Assist Walk 50 feet with 2 turns activity did not occur: Safety/medical concerns         Walk 150 feet activity   Assist Walk 150 feet activity did not occur: Safety/medical concerns         Walk 10 feet on uneven surface  activity   Assist Walk 10 feet on uneven surfaces activity did not occur: Safety/medical concerns         Wheelchair     Assist Is the patient using a wheelchair?: Yes Type of Wheelchair: Manual    Wheelchair assist level: Supervision/Verbal cueing Max wheelchair distance: 19'    Wheelchair 50 feet with 2 turns activity    Assist        Assist Level: Supervision/Verbal cueing   Wheelchair 150 feet activity     Assist      Assist Level: Minimal Assistance - Patient > 75%   Blood pressure 112/64, pulse (!) 56, temperature 97.9 F (36.6 C), resp. rate 17, height 5\' 11"  (1.803 m), weight 71.5 kg, SpO2 98 %. Medical Problem List and Plan: 1.   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  Antibiotic course completed..  Follow-up with ID as outpatient with immunologic work-up and viral serology             -patient may  shower             -ELOS/Goals: 21-24 days, min assist at w/c level  Patient is beginning CIR therapies today including PT and OT   -demonstrated improvement in LLE motor this morning  -con't PT and OT- CIR- team conference tomorrow to determine length of stay 2.  Antithrombotics: -DVT/anticoagulation:  Pharmaceutical:  Lovenox.  Check vascular study             -antiplatelet therapy: N/A 3. Pain Management: Tylenol/Advil as needed  10/10- will add lidoderm patches- 3 of them 8pm to 8am- if not enough, might try tylenol #3- wouldn't want tramadol if had encephalitis.  4. Mood: Provide emotional support             -antipsychotic agents: N/A 5. Neuropsych: This patient is capable of making decisions on his own behalf. 6. Skin/Wound Care: Routine skin checks             -  reiterated to pt that he needs to wear PRAFO's for heel protection and to stretch heel cords  10/10- wasn't wearing will encourage.  7. Fluids/Electrolytes/Nutrition: Routine in and outs with follow-up chemistries 8.  Seizure prophylaxis.  Keppra 500 mg twice daily 9.  Sinus bradycardia.  Echocardiogram unremarkable.  No further work-up indicated per cardiology services 10.  Neurogenic bladder/Urinary retention.  Failed voiding trial.               -maintain foley for a few more days -Continue Flomax.  Repeat voiding trial as mobility improves 10/10- con't Foley for now- maybe Thursday to take out> 11. Neurogenic bowel: begin scheduled senokot-s at HS.             -miralax daily             -consider bowel program depending upon continence and regularity             -pt reports no sense of bowel emptying currently.  10/10- will give Sorbitol this afternoon- might need bowel program if has incontinence 12. At level SCI pain  10/10- around ribs- feels so tight- it's due to at level SCI pain- starting Lidoderm.  Might need nerve pain meds.        LOS: 2 days A FACE TO FACE EVALUATION WAS PERFORMED  Lauralee Waters 04/08/2021, 9:18 AM

## 2021-04-08 NOTE — Progress Notes (Signed)
Inpatient Rehabilitation  Patient information reviewed and entered into eRehab system by Loredana Medellin Lorrie Gargan, OTR/L.   Information including medical coding, functional ability and quality indicators will be reviewed and updated through discharge.    

## 2021-04-08 NOTE — IPOC Note (Signed)
Overall Plan of Care [facility]) Patient Details Name: James Obrien MRN: [id number] DOB: 1985-09-30  Admitting Diagnosis: Myelitis [facility])  Hospital Problems: Principal Problem:   Myelitis (HCC) Active Problems:   Encephalitis     Functional Problem List: Nursing Bladder, Bowel, Edema, Endurance, Medication Management, Pain, Safety  PT Balance, Endurance, Motor, Pain, Safety, Sensory  OT Balance, Endurance, Motor, Sensory, Skin Integrity  SLP    TR         Basic ADL's: OT Grooming, Bathing, Dressing, Toileting     Advanced  ADL's: OT Simple Meal Preparation     Transfers: PT Bed Mobility, Bed to Chair, Car, Occupational psychologist, Research scientist (life sciences): PT Ambulation, Psychologist, prison and probation services, Stairs     Additional Impairments: OT None  SLP        TR      Anticipated Outcomes Item Anticipated Outcome  Self Feeding independent  Swallowing      Basic self-care  min a  Toileting  min a   Bathroom Transfers min a  Bowel/Bladder  min assist  Transfers  Supervision  Locomotion  Supervision WC mobility  Communication     Cognition     Pain  <3  Safety/Judgment  min assist and no falls   Therapy Plan: PT Intensity: Minimum of 1-2 x/day ,45 to 90 minutes PT Frequency: 5 out of 7 days PT Duration Estimated Length of Stay: ~3 weeks OT Intensity: Minimum of 1-2 x/day, 45 to 90 minutes OT Frequency: 5 out of 7 days OT Duration/Estimated Length of Stay: 2-3 weeks     Due to the current state of emergency, patients may not be receiving their 3-hours of Medicare-mandated therapy.   Team Interventions: Nursing Interventions Patient/Family Education, Bladder Management, Bowel Management, Disease Management/Prevention, Pain Management, Medication Management, Discharge Planning  PT interventions Ambulation/gait training, Community reintegration, DME/adaptive equipment instruction, Neuromuscular re-education, Psychosocial support, Stair training, UE/LE Strength  taining/ROM, Wheelchair propulsion/positioning, Warden/ranger, Discharge planning, Functional electrical stimulation, Pain management, Skin care/wound management, UE/LE Coordination activities, Therapeutic Activities, Cognitive remediation/compensation, Disease management/prevention, Functional mobility training, Patient/family education, Splinting/orthotics, Therapeutic Exercise  OT Interventions Balance/vestibular training, Neuromuscular re-education, Self Care/advanced ADL retraining, Therapeutic Exercise, Wheelchair propulsion/positioning, DME/adaptive equipment instruction, Skin care/wound managment, UE/LE Strength taining/ROM, Community reintegration, Patient/family education, UE/LE Coordination activities, Discharge planning, Functional mobility training, Psychosocial support, Therapeutic Activities  SLP Interventions    TR Interventions    SW/CM Interventions     Barriers to Discharge MD  Medical stability, Home enviroment access/loayout, Incontinence, Neurogenic bowel and bladder, Weight bearing restrictions, and Medication compliance  Nursing Decreased caregiver support, Home environment access/layout, Incontinence, Neurogenic Bowel & Bladder, Lack of/limited family support, Weight bearing restrictions, Medication compliance Discharging to mom's home. 1 level home with 4-5 steps at both entries. Right and left rails available. Wife will stay in camper with kids and will come to mom's house to assist in his care. Wife and mom can provide min assist at W/C level.  PT Home environment access/layout, Neurogenic Bowel & Bladder    OT      SLP      SW       Team Discharge Planning: Destination: PT-Home ,OT- Home , SLP-  Projected Follow-up: PT-Home health PT, 24 hour supervision/assistance, OT-  Home health OT, SLP-  Projected Equipment Needs: PT-To be determined, OT- Tub/shower bench, Sliding board, Other (comment) (drop arm commode), SLP-  Equipment Details: PT- , OT-   Patient/family involved in discharge planning: PT- Patient, Family member/caregiver,  OT-Patient,  SLP-   MD ELOS: ~ 3 weeks Medical Rehab Prognosis:  Good Assessment: Pt is a 35 yr old male with hx of encephalitis/myelitis- 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   Goals- min A to supervision.     See Team Conference Notes for weekly updates to the plan of care

## 2021-04-08 NOTE — Progress Notes (Signed)
Occupational Therapy Session Note  Patient Details  Name: James Obrien MRN: [id number] Date of Birth: February 14, 1986  Today's Date: 04/08/2021 OT Individual Time: 0756-0900   &   1000-1100 OT Individual Time Calculation (min): 64 min    &   60 min   Short Term Goals: Week 1:  OT Short Term Goal 1 (Week 1): patient will complete lower body bathing dressing at bed level with min A using assistive devices OT Short Term Goal 2 (Week 1): patient will complete upper body bathing and dressing w/c level with set up OT Short Term Goal 3 (Week 1): patient will sit unsupported for 10+ minutes with min A OT Short Term Goal 4 (Week 1): patient will complete SB transfers with mod A of one  Skilled Therapeutic Interventions/Progress Updates:    Session 1:   Patient in bed, alert and ready for therapy session.  Wife present for session.  He notes mild discomfort at rib cage but overall feeling better today.  Note improved rolling in bed and control of left leg.  Completed lower body bathing and dressing at bed level - introduced reacher, sock aide and dressing stick - practiced with devices and using cross leg technique to reach - he was able to wash buttocks today but continues to require assist to reach feet due to lower body weakness.  Max A for lower body dressing to include thigh high teds, incontinence brief, pants and slipper socks.    Side lying to sitting edge of bed with mod/max A.  Improved trunk control requiring min/mod A for seated balance.  SB transfer bed to w/c mod A.  He was able to complete upper body bathing, dressing and grooming tasks w/c level at sink with set up.  He remained seated in w/c at close of session - reviewed recline feature with wife and demonstrated elevating leg rests.  Call bell and tray table in reach.     Session 2:   patient holding basin at start of session due to nausea and stomach upset - he did not vomit but states that he is feeling awful and does not think he can  tolerate sitting up.  SB transfer w/c to bed mod/max A.    Sit to supine max A.  Upon lying down he states that he felt a little better and would like to complete activities bed level if tolerated.  BP 117/82, HR 61, O2 sat 100%.   Provided theraband, theraputty and dexterity activities for left hand/arm strengthening - he demonstrates good carryover after initial instruction.  Completed bed level hip/knee and ankle AAROM with good tolerance to activity.  He remained in bed at close of session, bed alarm set and call bell/tray table in reach.      Therapy Documentation Precautions:  Precautions Precautions: Fall Precaution Comments: paraparesis; check BP Other Brace: TED hose Restrictions Weight Bearing Restrictions: No   Therapy/Group: Individual Therapy  Barrie Lyme 04/08/2021, 7:29 AM

## 2021-04-08 NOTE — Progress Notes (Signed)
Physical Therapy Session Note  Patient Details  Name: James Obrien MRN: [id number] Date of Birth: [birth year]  Today's Date: 04/08/2021 PT Individual Time: 1345-1445 PT Individual Time Calculation (min): 60 min   Short Term Goals: Week 1:  PT Short Term Goal 1 (Week 1): Pt will perform bed mobility with modA +1. PT Short Term Goal 2 (Week 1): Pt will perform bed to chair transfer with modA +1. PT Short Term Goal 3 (Week 1): Pt will maintain static sitting balance for 5 minutes with close supervision.  Skilled Therapeutic Interventions/Progress Updates:  Patient supine in bed on entrance to room. Wife present. Patient alert and agreeable to PT session.   Patient with no pain complaint throughout session, but does relate feeling of abdominal pressure that has not relieved. Education provided re: body's inability to fully process stimuli whether noxious, pressure, etc. And he will need to remain vigilant to any changes since he has not had a recent BM.  Therapeutic Activity: Bed Mobility: Patient performed supine <> sit with Max A for bringing BLE off EOB and Min A for pushing UB to upright seated position and attaining balance. VC/ tc required for technique and maintaining midline with trunk control. NMR initiated in seated position EOB Transfers: Patient performed slide board transfers to L and R from bed <> w/c <> mat table throughout session with ModA for attaining and maintaining balance and body position for proper placement. Able to place board with min/ CGA. Provided vc/ tc  for technique throughout. Pt is able to perform transfers across board with CGA on each attempt.   Wheelchair Mobility:  Attempted but w/c is too wide for pt and he will require a smaller w/c.   Neuromuscular Re-ed: NMR facilitated during session with focus on seated balance, abdominal strengthening and return to midline. Pt guided in abdominal strengthening exercise with lean back into theraball and return  to upright seated position x10. Guided in push/ pull and well as slow, resisted push/ pull for additional core strengthening. Lateral trunk flexion to  bring elbows to touch mat and return to upright position requires min/ modA to return to upright. Forward reaches for ball with pt heavily landing on ball and guided with closer reaches and gentle grab of ball with improved motor control of trunk. NMR performed for improvements in motor control and coordination, balance, sequencing, judgement, and self confidence/ efficacy in performing all aspects of mobility at highest level of independence.   Patient supine  in bed at end of session with brakes locked, bed alarm set, and all needs within reach. Continues to relate pressure in abdomen. Pt reminded to relate any changes in amount or feelings of pressure to any staff as any small change in his perception of feeling may be significant.      Therapy Documentation Precautions:  Precautions Precautions: Fall Precaution Comments: paraparesis; check BP Other Brace: TED hose Restrictions Weight Bearing Restrictions: No  Pain:  No complaint of pain this session. Relates pressure in abdomen.   Therapy/Group: Individual Therapy  Loel Dubonnet PT, DPT 04/08/2021, 12:51 PM

## 2021-04-08 NOTE — Progress Notes (Signed)
PMR Admission Coordinator Pre-Admission Assessment  Patient: James Obrien is an 35 y.o., male MRN: [id number] DOB: 09/22/1985 Height: 5' 11" (180.3 cm) Weight: 74.8 kg  Insurance Information HMO:     PPO:      PCP:      IPA:      80/20: Yes     OTHER: Open access Plus PRIMARY: [employer] Generic (Al[employer])      Policy#: [id number]      Subscriber: patient CM Name: Lisa      Phone#: [phone] EXT [phone]     Fax#: clinicals given verbally Pre-Cert#: [id number] approved until 10/14 with fax [phone]   Employer: [employer] Benefits:  Phone #: [phone]     Name: Kelsey Eff. Date: 09/28/20     Deduct: $750 (met $0)      Out of Pocket Max: $3000 (met $0)      Life Max: N/A CIR: 80%      SNF: 80% Outpatient: 80%     Co-Pay: 20% Home Health: 80%      Co-Pay: 20% DME: 80%     Co-Pay: 20% Providers: in network  SECONDARY:  none         The "Data Collection Information Summary" for patients in Inpatient Rehabilitation Facilities with attached "Privacy Act Statement-Health Care Records" was provided and verbally reviewed with: n/a  Emergency Contact Information Contact Information     Name Relation Home Work Mobile   Loud,[name] Spouse   [phone]   Giodlin,Seth Friend   [phone]   Bright,Dakota Friend [phone]         Current Medical History  Patient Admitting Diagnosis: Longitudinal Myelitis and Paraplegia  History of Present Illness:   35-year-old right-handed male with unremarkable past medical history except for recent penile and scrotal lesions thought to be due to a spider bite as well as tobacco use on no prescription medications.    Presented 03/28/2021 with tingling and numbness of the left hand which quickly progressed to the lower extremities as well as a witnessed generalized tonic-clonic seizure that lasted 2-5 minutes.  Patient was loaded with Keppra.  EEG showed no seizure.  Cranial CT scan negative.  MRI showed multifocal abnormal hyperintense T2 weighted signal  within both hemispheres but greatest at the right pre and postcentral gyri.  Appearance was concerning for acute demyelination.  MRI thoracic lumbar spine longitudinally extensive cord signal abnormality/myelitis.  No hematoma or other complications.  CT of the chest abdomen pelvis showed subcutaneous fat stranding left lower quadrant anterior abdominal wall and left inguinal region consistent with cellulitis.  No fluid collection or abscess.  Reactive lymphadenopathy within the pelvis and bilateral inguinal regions.  Admission chemistries unremarkable except creatinine 1.27, urine cultures no growth, blood cultures no growth to date, lactic acid 4.1, HIV antibody nonreactive, RPR nonreactive, GC/chlamydia probe positive for gonorrhea..  Neurology consulted lumbar puncture showed evidence of pleocytosis and CBC with elevated WBCC.  Consistent with myelitis, encephalitis.  Patient was initially placed on empiric acyclovir for possible encephalitis discontinued 10/4 with negative HSV.  ID did see the patient and bacterial antibiotics were added after neutrophilic CSF was seen in addition to blood cultures positive for methicillin sensitive Staphylococcus epidermidis.  He was treated with a lower dose of steroids to help reduce potential spinal cord swelling.  Patient's antibiotic course has been completed.  Hospital course patient did have some bradycardia with cardiology follow-up and echocardiogram completed with ejection fraction of 55 to 60% no wall motion abnormality and no further work-up   was indicated.  Patient bouts of urinary retention and failed voiding trial with Foley tube in place and Flomax initiated.  Lovenox added for DVT prophylaxis  Patient's medical record from [facility] has been reviewed by the rehabilitation admission coordinator and physician.  Past Medical History  Past Medical History:  Diagnosis Date   Medical history non-contributory    Has the patient had major  surgery during 100 days prior to admission? Yes  Family History   family history is not on file.  Current Medications  Current Facility-Administered Medications:    acetaminophen (TYLENOL) tablet 650 mg, 650 mg, Oral, Q6H PRN, 650 mg at 04/05/21 0820 **OR** acetaminophen (TYLENOL) suppository 650 mg, 650 mg, Rectal, Q6H PRN, Wolfe, Allison, MD   Chlorhexidine Gluconate Cloth 2 % PADS 6 each, 6 each, Topical, Daily, Kc, Ramesh, MD, 6 each at 04/05/21 1203   enoxaparin (LOVENOX) injection 40 mg, 40 mg, Subcutaneous, Q24H, Ugah, Nwannadiya, MD, 40 mg at 04/04/21 2104   ibuprofen (ADVIL) tablet 600 mg, 600 mg, Oral, Q6H PRN, Chotiner, Bradley S, MD, 600 mg at 03/29/21 0145   levETIRAcetam (KEPPRA) tablet 500 mg, 500 mg, Oral, BID, Khaliqdina, Salman, MD, 500 mg at 04/05/21 0941   LORazepam (ATIVAN) injection 1 mg, 1 mg, Intravenous, Q4H PRN, Murphy, Rosselyn Martha A, PA-C   polyethylene glycol (MIRALAX / GLYCOLAX) packet 17 g, 17 g, Oral, BID, Feliz Ortiz, Abraham, MD   tamsulosin (FLOMAX) capsule 0.4 mg, 0.4 mg, Oral, Daily, Kc, Ramesh, MD, 0.4 mg at 04/05/21 0941  Patients Current Diet:  Diet Order             Diet - low sodium heart healthy           Diet regular Room service appropriate? Yes; Fluid consistency: Thin  Diet effective now                  Precautions / Restrictions Precautions Precautions: Fall Precaution Comments: paraparesis; check BP Other Brace: ace wraps for LE's Restrictions Weight Bearing Restrictions: No   Has the patient had 2 or more falls or a fall with injury in the past year? Yes  Prior Activity Level Community (5-7x/wk): He worked and drove pta;  Prior Functional Level Self Care: Did the patient need help bathing, dressing, using the toilet or eating? Independent  Indoor Mobility: Did the patient need assistance with walking from room to room (with or without device)? Independent  Stairs: Did the patient need assistance with internal or external  stairs (with or without device)? Independent  Functional Cognition: Did the patient need help planning regular tasks such as shopping or remembering to take medications? Independent  Patient Information Are you of Hispanic, Latino/a,or Spanish origin?: A. No, not of Hispanic, Latino/a, or Spanish origin What is your race?: A. White Do you need or want an interpreter to communicate with a doctor or health care staff?: 0. No  Patient's Response To:  Health Literacy and Transportation Is the patient able to respond to health literacy and transportation needs?: Yes Health Literacy - How often do you need to have someone help you when you read instructions, pamphlets, or other written material from your doctor or pharmacy?: Never In the past 12 months, has lack of transportation kept you from medical appointments or from getting medications?: No In the past 12 months, has lack of transportation kept you from meetings, work, or from getting things needed for daily living?: No  Home Assistive Devices / Equipment Home Equipment: None    Prior Device Use: Indicate devices/aids used by the patient prior to current illness, exacerbation or injury? None of the above  Current Functional Level Cognition  Overall Cognitive Status: Within Functional Limits for tasks assessed Orientation Level: Oriented X4 General Comments: Pt at times slow processing with word choice when seated EOB, although pt admits dizziness which may be distracting. Pt calm/cooperative.    Extremity Assessment (includes Sensation/Coordination)  Upper Extremity Assessment: RUE deficits/detail, LUE deficits/detail RUE Deficits / Details: AROM WFL, strength 5/5 throughout LUE Deficits / Details: AROM WFL, strength 3/5 grip, 3+/5 wrist flex/ext, 4-/5 elbow flexion, 3+/5 elbow extension, 4/5 shoulder flexion LUE Sensation: decreased light touch LUE Coordination: decreased fine motor, decreased gross motor  Lower Extremity Assessment:  Defer to PT evaluation RLE Deficits / Details: PROM WFL, strength noted some reflexive movement in toes with sensory testing, some push toward hip extension versus knee extension with quick stretch leg in full flexion, but could not extend even to half the range, othewise pt unable to elicit movement RLE Sensation: decreased light touch (reports abnormal sensation below the waist) LLE Deficits / Details: PROM WFL, strength noted some reflexive movement in toes with sensory testing, some push toward hip extension versus knee extension with quick stretch leg in full flexion, but could not extend as much as R, othewise pt unable to elicit movement LLE Sensation: decreased light touch (reports abnormal sensation below the waist)    ADLs  Overall ADL's : Needs assistance/impaired Eating/Feeding: Set up Eating/Feeding Details (indicate cue type and reason): assistance with packaging - pt is LHD, but able to self feed with R hand. May benefit from built up handle Grooming: Set up, Sitting Upper Body Bathing: Maximal assistance, Sitting Upper Body Bathing Details (indicate cue type and reason): assist for sitting balance and bathing Lower Body Bathing: Maximal assistance, Sitting/lateral leans, +2 for physical assistance, +2 for safety/equipment Upper Body Dressing : Maximal assistance, Sitting Lower Body Dressing: Total assistance, Sitting/lateral leans, +2 for physical assistance, +2 for safety/equipment Toilet Transfer: Total assistance, +2 for physical assistance, +2 for safety/equipment Toileting- Clothing Manipulation and Hygiene: Maximal assistance, Sitting/lateral lean Functional mobility during ADLs: +2 for physical assistance, +2 for safety/equipment General ADL Comments: assist for balance, paraparesis, impaired function of LUE and BLE    Mobility  Overal bed mobility: Needs Assistance Bed Mobility: Rolling, Sidelying to Sit Rolling: Mod assist Sidelying to sit: Mod assist, HOB elevated,  +2 for safety/equipment Supine to sit: Max assist, HOB elevated Sit to supine: Max assist, +2 for safety/equipment, +2 for physical assistance General bed mobility comments: pt requires assistance for movement of LEs, able to utilize UE strength to elevate trunk into sitting. Pt with posterior loss of balance during initial attempt due to core weakness so second person guarding for safety    Transfers  Overall transfer level: Needs assistance Equipment used: None Transfers: Lateral/Scoot Transfers, Sit to/from Stand Sit to Stand: Total assist Stand pivot transfers: Total assist  Lateral/Scoot Transfers: Max assist, +2 safety/equipment General transfer comment: Cues for anterior trunk lean and hand placement. PT utilizing bed pad to maintain anterior trunk lean and prevent posterior loss of balance due to impaired core strength, +2 for safety this session and totalA for BLE positioning while scooting. Pt performs partial sit to stand with PT knee block and BUE support of PT, aiding repositioning of bed pad.    Ambulation / Gait / Stairs / Wheelchair Mobility       Posture / Balance Dynamic Sitting Balance Sitting balance -   Comments: pt reliant on BUE support of bed, multiple rightward and posterior losses of balance at edge of bed with removal of UE support Balance Overall balance assessment: Needs assistance Sitting-balance support: Bilateral upper extremity supported Sitting balance-Leahy Scale: Poor Sitting balance - Comments: pt reliant on BUE support of bed, multiple rightward and posterior losses of balance at edge of bed with removal of UE support Postural control: Posterior lean, Right lateral lean Standing balance support: Bilateral upper extremity supported Standing balance-Leahy Scale: Zero Standing balance comment: requires knee block and totalA for partial stand x2 reps for pad repositioning    Special needs/care consideration 10/6 non latex 14 fr catheter placed On 10/ 6  patient without BM for 8 days. Acute has given him an enema without results Seizure Precautions   Previous Home Environment  Living Arrangements: Spouse/significant other (11 12 and 5 year old children in the home)  Lives With: Spouse, Family Available Help at Discharge:  (His MOm is retired and will be asissting with his care as well as his wife) Type of Home: Other(Comment) Home Layout: Other (Comment) (30 foot camper) Home Access: Stairs to enter Entrance Stairs-Rails: None Entrance Stairs-Number of Steps: 3 Bathroom Shower/Tub: Tub/shower unit Bathroom Toilet: Standard Bathroom Accessibility: No Home Care Services: No Additional Comments: 3 children 12, 11 and 5 and wife homeschools  Discharge Living Setting Plans for Discharge Living Setting: Lives with (comment) (to go stay at his Mom's home) Type of Home at Discharge: House Discharge Home Layout: One level Discharge Home Access: Stairs to enter Entrance Stairs-Rails: Right, Left Entrance Stairs-Number of Steps: 4 to 5 steps at both entries Discharge Bathroom Shower/Tub: Tub/shower unit Discharge Bathroom Toilet: Standard Discharge Bathroom Accessibility:  (do not know if wheelchair will fit) Does the patient have any problems obtaining your medications?: No  Wife will stay in camper with kids and she will come to his Mom's home to assist in his care.  Mom's home address is [address] [place] . This is in [place] near [place], [place]. I explained on 10/7 to couple that they need to have ramp built at his Mom's home asap and for wife to investigate outpatient neuro rehab offices for his follow up once discharged home.(Likely in the [place] or [place] area).  I met with couple on 10/7 with extensive discussions of need for bowel and bladder teaching of both to manage his needs as well as wheelchair level goals and ELOS of 2 to 3 weeks with follow up as an outpatient.  Social/Family/Support Systems Patient Roles:  Spouse Contact Information: [phone] Anticipated Caregiver: [name] Ostenson Anticipated Caregiver's Contact Information: see contact numbers Ability/Limitations of Caregiver: min asisst at wheelchair level Caregiver Availability:  (Mom can provide 24/7; wife will be with kids in camper and then come over to asisst) Discharge Plan Discussed with Primary Caregiver: Yes Is Caregiver In Agreement with Plan?: Yes Does Caregiver/Family have Issues with Lodging/Transportation while Pt is in Rehab?:  (wife staying in hospital with pt 24/7)  Goals Patient/Family Goal for Rehab: min assist with PT and OT at wheelchair level Expected length of stay: 21-24 days Pt/Family Agrees to Admission and willing to participate: Yes Program Orientation Provided & Reviewed with Pt/Caregiver Including Roles  & Responsibilities: Yes  Decrease burden of Care through IP rehab admission: n/a  Possible need for SNF placement upon discharge: not anticipated  Patient Condition: I have reviewed medical records from [facility], spoken with CM, and patient and spouse. I met with   patient at the bedside for inpatient rehabilitation assessment.  Patient will benefit from ongoing PT and OT, can actively participate in 3 hours of therapy a day 5 days of the week, and can make measurable gains during the admission.  Patient will also benefit from the coordinated team approach during an Inpatient Acute Rehabilitation admission.  The patient will receive intensive therapy as well as Rehabilitation physician, nursing, social worker, and care management interventions.  Due to bladder management, bowel management, safety, skin/wound care, disease management, medication administration, pain management, and patient education the patient requires 24 hour a day rehabilitation nursing.  The patient is currently overall mod to max assist with transfers only with mobility and basic ADLs.  Discharge setting and therapy post  discharge at home with home health is anticipated.  Patient has agreed to participate in the Acute Inpatient Rehabilitation Program and will admit Saturday 10/8 when bed is available..  Preadmission Screen Completed By:  Boyette, Barbara Godwin, 04/05/2021 1:36 PM ______________________________________________________________________   Discussed status with Dr. Swartz on 04/05/2021 at  1330 and received approval for admission Saturday 10/8 when CIR bed is available.  Admission Coordinator:  Boyette, Barbara Godwin, RN, time 1330 Date 04/05/2021   Assessment/Plan: Diagnosis: longitudinal myelitis and paraplegia Does the need for close, 24 hr/day Medical supervision in concert with the patient's rehab needs make it unreasonable for this patient to be served in a less intensive setting? Yes Co-Morbidities requiring supervision/potential complications: pain, neurogenic bladder and bowel Due to bladder management, bowel management, safety, skin/wound care, disease management, medication administration, pain management, and patient education, does the patient require 24 hr/day rehab nursing? Yes Does the patient require coordinated care of a physician, rehab nurse, PT, OT to address physical and functional deficits in the context of the above medical diagnosis(es)? Yes Addressing deficits in the following areas: balance, endurance, locomotion, strength, transferring, bowel/bladder control, bathing, dressing, feeding, grooming, toileting, and psychosocial support Can the patient actively participate in an intensive therapy program of at least 3 hrs of therapy 5 days a week? Yes The potential for patient to make measurable gains while on inpatient rehab is excellent Anticipated functional outcomes upon discharge from inpatient rehab: min assist PT, min assist OT, n/a SLP Estimated rehab length of stay to reach the above functional goals is: 21-24 days Anticipated discharge destination: Home 10. Overall  Rehab/Functional Prognosis: excellent   MD Signature: Zachary T. Swartz, MD, FAAPMR [facility] 04/06/2021  

## 2021-04-09 MED ORDER — BISACODYL 10 MG RE SUPP
10.0000 mg | Freq: Every day | RECTAL | Status: DC
  Administered 2021-04-10 – 2021-04-24 (×15): 10 mg via RECTAL
  Filled 2021-04-09 (×15): qty 1

## 2021-04-09 MED ORDER — SORBITOL 70 % SOLN
60.0000 mL | Freq: Once | Status: DC
Start: 2021-04-09 — End: 2021-04-25

## 2021-04-09 NOTE — Progress Notes (Signed)
Physical Therapy Session Note  Patient Details  Name: James Obrien MRN: [id number] Date of Birth: 04-Jul-1985  Today's Date: 04/09/2021 PT Individual Time: 0800-0900, 1100-1130 PT Individual Time Calculation (min): 60 min, 30 min   Short Term Goals: Week 1:  PT Short Term Goal 1 (Week 1): Pt will perform bed mobility with modA +1. PT Short Term Goal 2 (Week 1): Pt will perform bed to chair transfer with modA +1. PT Short Term Goal 3 (Week 1): Pt will maintain static sitting balance for 5 minutes with close supervision.  Skilled Therapeutic Interventions/Progress Updates:    Session 1:  pt received in bed and agreeable to therapy. No complaint of pain. Pt's wife present throughout session. Donned thigh high teds with tot A. Noted incontinence of bowel, pt rolled with supervision throughout brief change and was able to pull pants up over hips with supervision. Supine > sit with supervision, using UE to maneuver LE. Pt performed slideboard transfer to w/c with CGA to place board and perform transfer. Pt then instructed on squat pivot transfer, including part practice of anterior weight shift and Squat pivot transfer with max A fading to mod A x 3. Sit >supine with mod A for LE management. Pt remained in bed after session and was left with all needs in reach and alarm active.   Session 2: Pt seated in w/c on arrival and agreeable to therapy. No complaint of pain. Pt's wife present throughout.  Session focused on standing tolerance and LE weightbearing to encourage functional return of muscle activation. Pt stood with stedy x5 and x3 with seated rest break between bouts. Pt demoes forward trunk lean in standing d/t lack of glute activation. Pt c/o discomfort in his wrists d/t using UE to stand. Pt remained in w/c at end of session and was left with all needs in reach and alarm active.   Therapy Documentation Precautions:  Precautions Precautions: Fall Precaution Comments: paraparesis; check  BP Other Brace: TED hose Restrictions Weight Bearing Restrictions: No    Therapy/Group: Individual Therapy  [name] 04/09/2021, 12:57 PM

## 2021-04-09 NOTE — Progress Notes (Signed)
Occupational Therapy Session Note  Patient Details  Name: Mekhai Venuto MRN: [id number] Date of Birth: 24-Dec-1985  Today's Date: 04/09/2021 OT Individual Time: 2620-3559 OT Individual Time Calculation (min): 54 min    Short Term Goals: Week 1:  OT Short Term Goal 1 (Week 1): patient will complete lower body bathing dressing at bed level with min A using assistive devices OT Short Term Goal 2 (Week 1): patient will complete upper body bathing and dressing w/c level with set up OT Short Term Goal 3 (Week 1): patient will sit unsupported for 10+ minutes with min A OT Short Term Goal 4 (Week 1): patient will complete SB transfers with mod A of one   Skilled Therapeutic Interventions/Progress Updates:    Pt greeted at time of session sidelying in bed sleeping, easily woken with verbal stimuli. No pain throughout session. After waking, supine > sit CGA with extended time to manage LE's using Ue's. Once EOB, slide board Min A for placement under pants, CGA for slide board transfer. Self propel partially to the gym, OT resuming as w/c is wide and difficult for pt to fully get arms around sides. Spoke with PT regarding new fit. Squat pivot wheelchair <> mat with Min A overall, truncal ataxia and LE weakness limiting but good carryover of technique. On mat, focused on unsupported sitting with several core strengthening activities with reaching down shins, anteriorly with ball between hands, and overhead for 1x10 each with truncal ataxia noted and multiple small LOB but able to correct. Squat pivot w/c > bed Min A. Sit > supine Min A for LE management. Alarm on call bell in reach.   Therapy Documentation Precautions:  Precautions Precautions: Fall Precaution Comments: paraparesis; check BP Other Brace: TED hose Restrictions Weight Bearing Restrictions: No    Therapy/Group: Individual Therapy  Erasmo Score 04/09/2021, 7:28 AM

## 2021-04-09 NOTE — Progress Notes (Signed)
PROGRESS NOTE   Subjective/Complaints:  Pt reports last night rough- wife went to hospital/ER due to allergic reaction.  Lidoderm patches helped some- doesn't want more pills for pain/nerve pain at this time.   No BM with Sorbitol- just gas. Slept better with pain issues when was asleep.  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   ROS:  Pt denies SOB, abd pain, CP, N/V/C/D, and vision changes   Objective:   VAS [place] LOWER EXTREMITY VENOUS (DVT)  Result Date: 04/08/2021  Lower Venous DVT Study Patient Name:  James Obrien  Date of Exam:   04/07/2021 Medical Rec #: [id number]            Accession #:    [id number] Date of Birth: [birth year]             Patient Gender: M Patient Age:   35 years Exam Location:  [facility] Procedure:      VAS [place] LOWER EXTREMITY VENOUS (DVT) Referring Phys: Mariam Dollar --------------------------------------------------------------------------------  Other Indications: Encephalitis and myelitis, limited mobility, in inpatient                    rehabiliation. Limitations: Poor ultrasound/tissue interface and restricted mobility. Comparison Study: No prior study Performing Technologist: Gertie Fey MHA, RDMS, RVT, RDCS  Examination Guidelines: A complete evaluation includes B-mode imaging, spectral Doppler, color Doppler, and power Doppler as needed of all accessible portions of each vessel. Bilateral testing is considered an integral part of a complete examination. Limited examinations for reoccurring indications may be performed as noted. The reflux portion of the exam is performed with the patient in reverse Trendelenburg.  +---------+---------------+---------+-----------+----------+--------------+ RIGHT    CompressibilityPhasicitySpontaneityPropertiesThrombus Aging +---------+---------------+---------+-----------+----------+--------------+ CFV      Full            Yes      Yes                                 +---------+---------------+---------+-----------+----------+--------------+ SFJ      Full                                                        +---------+---------------+---------+-----------+----------+--------------+ FV Prox  Full                                                        +---------+---------------+---------+-----------+----------+--------------+ FV Mid   Full                                                        +---------+---------------+---------+-----------+----------+--------------+ FV DistalFull                                                        +---------+---------------+---------+-----------+----------+--------------+  PFV      Full                                                        +---------+---------------+---------+-----------+----------+--------------+ POP      Full           Yes      Yes                                 +---------+---------------+---------+-----------+----------+--------------+ PTV      Full                                                        +---------+---------------+---------+-----------+----------+--------------+ PERO     Full                                                        +---------+---------------+---------+-----------+----------+--------------+   +---------+---------------+---------+-----------+----------+--------------+ LEFT     CompressibilityPhasicitySpontaneityPropertiesThrombus Aging +---------+---------------+---------+-----------+----------+--------------+ CFV      Full           Yes      Yes                                 +---------+---------------+---------+-----------+----------+--------------+ SFJ      Full                                                        +---------+---------------+---------+-----------+----------+--------------+ FV Prox  Full                                                         +---------+---------------+---------+-----------+----------+--------------+ FV Mid   Full                                                        +---------+---------------+---------+-----------+----------+--------------+ FV DistalFull                                                        +---------+---------------+---------+-----------+----------+--------------+ PFV      Full                                                        +---------+---------------+---------+-----------+----------+--------------+  POP      Full           Yes      Yes                                 +---------+---------------+---------+-----------+----------+--------------+ PTV      Full                                                        +---------+---------------+---------+-----------+----------+--------------+ PERO     Full                                                        +---------+---------------+---------+-----------+----------+--------------+     Summary: BILATERAL: - No evidence of deep vein thrombosis seen in the lower extremities, bilaterally. -No evidence of popliteal cyst, bilaterally.   *See table(s) above for measurements and observations. Electronically signed by Gerarda Fraction on 04/08/2021 at 6:49:56 AM.    Final    Recent Labs    04/06/21 1841 04/08/21 0620  WBC 8.8 5.9  HGB 14.7 14.6  HCT 44.8 42.8  PLT 296 272   Recent Labs    04/06/21 1841 04/08/21 0620  NA  --  134*  K  --  4.3  CL  --  99  CO2  --  26  GLUCOSE  --  104*  BUN  --  27*  CREATININE 1.00 1.00  CALCIUM  --  9.3    Intake/Output Summary (Last 24 hours) at 04/09/2021 0913 Last data filed at 04/09/2021 0755 Gross per 24 hour  Intake 480 ml  Output 1000 ml  Net -520 ml        Physical Exam: Vital Signs Blood pressure 113/61, pulse 67, temperature 98.3 F (36.8 C), temperature source Oral, resp. rate 18, height 5\' 11"  (1.803 m), weight 71.5 kg, SpO2 97  %.     General: awake, alert, appropriate, asleep; but woke with verbal stimuli; wife at bedside; supine in bed; NAD HENT: conjugate gaze; oropharynx moist CV: regular rate; no JVD Pulmonary: CTA B/L; no W/R/R- good air movement; no bruising around ribs where is painful GI: soft, NT, ND, (+)BS Psychiatric: appropriate Neurological: alert  Extremities: No clubbing, cyanosis, or edema. Pulses are 2+ Skin: numerous tats, left heel ulcer with foam dressing Neuro:  Alert and oriented x 3. Normal insight and awareness. Intact Memory. Normal language and speech. Cranial nerve exam unremarkable. Decreased LT/pain sense aroung T5-6 with legs more affected than trunk. UE 4+/5. LLE 1+ to 2- HE, KE, 1/5 APF, 0/5 ADF. RLE 0/5. No resting tone. DTR's trace Musculoskeletal: minimal pain with limb rom. Heel cords a little tight    Assessment/Plan: 1. Functional deficits which require 3+ hours per day of interdisciplinary therapy in a comprehensive inpatient rehab setting. Physiatrist is providing close team supervision and 24 hour management of active medical problems listed below. Physiatrist and rehab team continue to assess barriers to discharge/monitor patient progress toward functional and medical goals  Care Tool:  Bathing    Body parts bathed by patient: Right arm, Left arm, Chest, Abdomen, Front perineal area,  Right upper leg, Left upper leg, Face, Buttocks   Body parts bathed by helper: Right lower leg, Left lower leg     Bathing assist Assist Level: Minimal Assistance - Patient > 75%     Upper Body Dressing/Undressing Upper body dressing   What is the patient wearing?: Pull over shirt    Upper body assist Assist Level: Set up assist    Lower Body Dressing/Undressing Lower body dressing      What is the patient wearing?: Pants, Incontinence brief     Lower body assist Assist for lower body dressing: Maximal Assistance - Patient 25 - 49%     Toileting Toileting Toileting  Activity did not occur (Clothing management and hygiene only): N/A (no void or bm)  Toileting assist Assist for toileting: Total Assistance - Patient < 25%     Transfers Chair/bed transfer  Transfers assist     Chair/bed transfer assist level: Contact Guard/Touching assist Chair/bed transfer assistive device: Sliding board   Locomotion Ambulation   Ambulation assist   Ambulation activity did not occur: Safety/medical concerns          Walk 10 feet activity   Assist  Walk 10 feet activity did not occur: Safety/medical concerns        Walk 50 feet activity   Assist Walk 50 feet with 2 turns activity did not occur: Safety/medical concerns         Walk 150 feet activity   Assist Walk 150 feet activity did not occur: Safety/medical concerns         Walk 10 feet on uneven surface  activity   Assist Walk 10 feet on uneven surfaces activity did not occur: Safety/medical concerns         Wheelchair     Assist Is the patient using a wheelchair?: Yes Type of Wheelchair: Manual    Wheelchair assist level: Supervision/Verbal cueing Max wheelchair distance: 65'    Wheelchair 50 feet with 2 turns activity    Assist        Assist Level: Supervision/Verbal cueing   Wheelchair 150 feet activity     Assist      Assist Level: Maximal Assistance - Patient 25 - 49% (per PT documentation)   Blood pressure 113/61, pulse 67, temperature 98.3 F (36.8 C), temperature source Oral, resp. rate 18, height 5\' 11"  (1.803 m), weight 71.5 kg, SpO2 97 %. Medical Problem List and Plan: 1.   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  Antibiotic course completed..  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  2.  Antithrombotics: -DVT/anticoagulation:  Pharmaceutical: Lovenox.  Check vascular study  10/11- vascular study (-)             -antiplatelet therapy: N/A 3. Pain Management: Tylenol/Advil as needed  10/10- will add lidoderm patches- 3 of them 8pm to 8am- if not enough, might try tylenol #3- wouldn't want tramadol if had encephalitis.   10/11- pt reports pain somewhat better- doesn't "want more pills".  4. Mood: Provide emotional  support             -antipsychotic agents: N/A 5. Neuropsych: This patient is capable of making decisions on his own behalf. 6. Skin/Wound Care: Routine skin checks             -reiterated to pt that he needs to wear PRAFO's for heel protection and to stretch heel cords  10/10- wasn't wearing will encourage.  7. Fluids/Electrolytes/Nutrition: Routine in and outs with follow-up chemistries 8.  Seizure prophylaxis.  Keppra 500 mg twice daily 9.  Sinus bradycardia.  Echocardiogram unremarkable.  No further work-up indicated per cardiology services 10.  Neurogenic bladder/Urinary retention.  Failed voiding trial.               -maintain foley for a few more days -Continue Flomax.  Repeat voiding trial as mobility improves 10/10- con't Foley for now- maybe Thursday to take out. 11. Neurogenic bowel: begin scheduled senokot-s at HS.             -miralax daily             -consider bowel program depending upon continence and regularity             -pt reports no sense of bowel emptying currently.  10/10- will give Sorbitol this afternoon- might need bowel program if has incontinence  10/11- start bowel program after giving 60cc of Sorbitol today- just suppository for now after meals.  12. At level SCI pain  10/10- around ribs- feels so tight- it's due to at level SCI pain- starting Lidoderm.  Might need nerve pain meds.   10/11- somewhat  better with patches- con't regimen       LOS: 3 days A FACE TO FACE EVALUATION WAS PERFORMED  James Obrien 04/09/2021, 9:13 AM

## 2021-04-09 NOTE — Progress Notes (Signed)
Physical Therapy Session Note  Patient Details  Name: James Obrien MRN: [id number] Date of Birth: 01/11/1986  Today's Date: 04/09/2021 PT Individual Time: 0093-8182 PT Individual Time Calculation (min): 28 min   Short Term Goals: Week 1:  PT Short Term Goal 1 (Week 1): Pt will perform bed mobility with modA +1. PT Short Term Goal 2 (Week 1): Pt will perform bed to chair transfer with modA +1. PT Short Term Goal 3 (Week 1): Pt will maintain static sitting balance for 5 minutes with close supervision.  Skilled Therapeutic Interventions/Progress Updates:    Pt received supine in bed and agreeable to therapy session. Supine>sitting R EOB, HOB slightly elevated and using bedrail, with min A for LE management and trunk control - pt using UEs to assist with B LE management off EOB and to maintain trunk control. Therapy session focused on sitting balance/trunk control including the following activities:  - 2lb dowel rod seated shoulder presses progressed to 3lb with pt requiring intermittent UE support to maintain upright due to mild anterior LOB - 2lb dowel rod simulated rowing movement targeting trunk rotation with pt having most frequent LOB towards R posterior - 1kg weighted ball toss to chair with pt progressing to maintaining balance without using UE assist to stay upright - 1kg weighted ball PNF diagonals with pt having most difficulty again with R trunk rotation Pt requires close supervision and intermittent min assist for balance recovery with pt using UEs to assist with preventing LOB. Sit>supine with min assist primarily for assisting with LEs onto bed. Pt left supine in bed with needs in reach and bed alarm on.  Therapy Documentation Precautions:  Precautions Precautions: Fall Precaution Comments: paraparesis; check BP Other Brace: TED hose Restrictions Weight Bearing Restrictions: No   Pain: No reports of pain throughout session.   Therapy/Group: Individual  Therapy  Ginny Forth , PT, DPT, NCS, CSRS 04/09/2021, 12:23 PM

## 2021-04-09 NOTE — Progress Notes (Signed)
Patient ID: James Obrien, male   DOB: May 05, 1986, 35 y.o.   MRN: [id number] Team Conference Report to Patient/Family  Team Conference discussion was reviewed with the patient and caregiver, including goals, any changes in plan of care and target discharge date.  Patient and caregiver express understanding and are in agreement.  The patient has a target discharge date of 04/25/21.  SW met with pt and spouse to provide team conference updates. Pt requesting to send SW disability information. Pt spouse reports having an allergic reaction. No additional questions or concerns. [name] 04/09/2021, 12:29 PM

## 2021-04-09 NOTE — Progress Notes (Signed)
Occupational Therapy Session Note  Patient Details  Name: James Obrien MRN: [id number] Date of Birth: 1986-02-14  Today's Date: 04/09/2021 OT Individual Time: 1003-1035 OT Individual Time Calculation (min): 32 min    Short Term Goals: Week 1:  OT Short Term Goal 1 (Week 1): patient will complete lower body bathing dressing at bed level with min A using assistive devices OT Short Term Goal 2 (Week 1): patient will complete upper body bathing and dressing w/c level with set up OT Short Term Goal 3 (Week 1): patient will sit unsupported for 10+ minutes with min A OT Short Term Goal 4 (Week 1): patient will complete SB transfers with mod A of one  Skilled Therapeutic Interventions/Progress Updates:    Pt received in bed with no c/o pain, wife present and agreeable to OT session focusing on grooming and dressing at sink level in wc. Overall, pt mildly impulsive with transfers and moves quickly. Needed to be told to wait for wc to be locked before transferring.   ADL: Skilled interventions include: Pt completes supine > EOB with MIN A for posterior trunk support and help facilitating BLE to EOB.Required MIN directional cuing to wait for wc to be setup for transfer and brakes locked. Pt transferred EOB > WC using SB with CGA and educational cuing for safe hand placement with SB. Hands in frequent flexed position due to weakness and incoordination. Pt propelled wc to sink. Pt completes grooming with electric shaver with setup and close S. Able to doff shirt and complete UB bathing with setup. Pt left at end of session in wc with exit belt alarm on, wife present, call light in reach and all needs met.    Therapy Documentation Precautions:  Precautions Precautions: Fall Precaution Comments: paraparesis; check BP Other Brace: TED hose Restrictions Weight Bearing Restrictions: No   Therapy/Group: Individual Therapy  [name] 04/09/2021, 7:32 AM

## 2021-04-10 NOTE — Progress Notes (Signed)
Physical Therapy Session Note  Patient Details  Name: James Obrien MRN: [id number] Date of Birth: 11-Jun-1986  Today's Date: 04/10/2021 PT Individual Time: 1300-1415 PT Individual Time Calculation (min): 75 min   Short Term Goals: Week 1:  PT Short Term Goal 1 (Week 1): Pt will perform bed mobility with modA +1. PT Short Term Goal 2 (Week 1): Pt will perform bed to chair transfer with modA +1. PT Short Term Goal 3 (Week 1): Pt will maintain static sitting balance for 5 minutes with close supervision.  Skilled Therapeutic Interventions/Progress Updates:    pt received in bed and agreeable to therapy. No complaint of pain. Pt reports incr tingling sensation in his legs, and that they feel heavier than previous days. Bed mobility with mod A for LE management. Mod A squat pivot to w/c. Pt propelled w/c with BUE x50 ft, x150 ft for endurance and functional mobility. Sit to stand with stedy with CGA x6 to increase LE WB and with taping facilitation at glutes, cues for upright posture to increase LE work. While perched in stedy, pt performed the following with 5lb dumbbell for improved core activation and dynamic sitting balance: twists, chest press, and D2 diagonals. Pt demoed LOB posterior>anterior and was able to recover with UE support or min A. Pt expressed confusion about his condition, so returned to room and went over H&P and discussed questions to ask MD in the following AM. Pt remained in w/c at end of session and was left with all needs in reach and alarm active.    Therapy Documentation Precautions:  Precautions Precautions: Fall Precaution Comments: paraparesis; check BP Other Brace: TED hose Restrictions Weight Bearing Restrictions: No    Therapy/Group: Individual Therapy  [name] 04/10/2021, 6:36 PM

## 2021-04-10 NOTE — Progress Notes (Signed)
Occupational Therapy Session Note  Patient Details  Name: James Obrien MRN: [id number] Date of Birth: 04-28-1986  Today's Date: 04/10/2021 OT Individual Time: 6578-4696 and 2952-8413 OT Individual Time Calculation (min): 70 min and 58 min   Short Term Goals: Week 1:  OT Short Term Goal 1 (Week 1): patient will complete lower body bathing dressing at bed level with min A using assistive devices OT Short Term Goal 2 (Week 1): patient will complete upper body bathing and dressing w/c level with set up OT Short Term Goal 3 (Week 1): patient will sit unsupported for 10+ minutes with min A OT Short Term Goal 4 (Week 1): patient will complete SB transfers with mod A of one   Skilled Therapeutic Interventions/Progress Updates:    Session 1: Pt greeted at time of session supine in bed resting, easily woken with verbal stimuli and agreeable to OT session. No pain reported. Focus of session on bed level bathing for LB washing legs and periarea/buttocks bed level. Brief change  with pt rolling Min A, therapist assisting with brief change. Donned pants Mod A, assist to thread Foley and feet, pt able to work up to hips and help don over hips. Supine > sit with compensatory techniques and sitting EOB. Squat pivot bed > w/c with CGA. Self propel to sink for UB bathing/dressing with back support of w/c with Supervision. Note NT present at this time discussing move to [facility] as pt had several questions regarding the move. Squat pivot back to bed at end of session CGA as well. Alarm on call bell in reach.  Session 2: Pt greeted at time of session sitting up in wheelchair agreeable to OT session, no pain reported. Self propel partially to gym and therapist assisting with elevator transport portion. In ortho gym, focused on standing balance/tolerance and weight bearing for BLEs in standing frame for 15 minutes, no s/s of dizziness or lightheadedness while pt performing multiple glute and quad activation activities  and standing chest press with lightweight ball, all in standing frame. Note standing frame did not provide ideal support 2/2 pt height and unable to adjust. Remainder of session focused on core strength and sitting balance unsupported with BLEs on block for sets of 20 rebounder throws for standard toss and toss + torso twist with 2kg weighted ball. Back in room, squat pivot to bed CGA. Alarm on call bell in reach.   Therapy Documentation Precautions:  Precautions Precautions: Fall Precaution Comments: paraparesis; check BP Other Brace: TED hose Restrictions Weight Bearing Restrictions: No     Therapy/Group: Individual Therapy  Erasmo Score 04/10/2021, 7:12 AM

## 2021-04-10 NOTE — Patient Care Conference (Signed)
Inpatient RehabilitationTeam Conference and Plan of Care Update Date: 04/09/2021   Time: 11:30 AM    Patient Name: James Obrien [facility]      Medical Record Number: [id number]  Date of Birth: 09/20/1985 Sex: Male         Room/Bed: 4M03C/4M03C-01 Payor Info: Payor: [employer] / Plan: Research scientist (life sciences) / Product Type: *No Product type* /    Admit Date/Time:  04/06/2021  3:55 PM  Primary Diagnosis:  Myelitis [facility])  Hospital Problems: Principal Problem:   Myelitis (HCC) Active Problems:   Encephalitis    Expected Discharge Date: Expected Discharge Date: 04/25/21  Team Members Present: Physician leading conference: Dr. Genice Rouge Social Worker Present: [name], BSW Nurse Present: Kennyth Arnold, RN PT Present: Bernie Covey, PT OT Present: Earleen Newport, OT PPS Coordinator present : Fae Pippin, SLP     Current Status/Progress Goal Weekly Team Focus  Bowel/Bladder   foley in place, last bowel movement prior ro admission, constipation noted. oral meds given  regain regular bowel movement      Swallow/Nutrition/ Hydration             ADL's   total A toileting, Max LB dress,Min LB bathing, Set up/Supervision UB, slide board Min/CGA  Min LB, Min transfers  core, sitting balance, standing balance, ADL retraining, global endurance, NMR   Mobility   CGA slideboard, mod squat pivot, min STS with stedy  supervision overal mod I wc  transfers, mobility   Communication             Safety/Cognition/ Behavioral Observations  pt aware of safety limitations, wife at bedside. pt able to express needs         Pain   pt c/o pain 3/10 in rib/chest area, relieved with tylenol and lidocaine patches  pain to remain control on current regimen      Skin   penile lesions are healing. otherwise skin intact  skin to remain intact        Discharge Planning:  Discharging to mothers home with mother, spouse and family to provide assistance   Team Discussion: Lidocaine patches in place  for pain. Foley to be discontinued on Thursday. MD ordered 60 mL of Sorbitol for constipation. Will need bowel program. Needs to drink fluids every 2 hr. Reports minimal pain to nursing. Nursing reports penial wounds. Will follow up. Therapy reports patient had large continent bowel movement this morning. Working on [employer]. Patient on target to meet rehab goals: yes, transfers with slide board min assist/contact guard. Max assist for lower body ADL's. Total assist for toileting. Supervision/min assist goals.  *See Care Plan and progress notes for long and short-term goals.   Revisions to Treatment Plan:  Adjusting medications, nursing to document skin/wound care.  Teaching Needs: Family education, medication management, pain management, skin/wound care, bowel/bladder management, transfer training, mobility training, balance training, endurance training, safety awareness.  Current Barriers to Discharge: Decreased caregiver support, Medical stability, Home enviroment access/layout, Incontinence, Neurogenic bowel and bladder, Wound care, Lack of/limited family support, Weight bearing restrictions, Medication compliance, and Behavior  Possible Resolutions to Barriers: Family education with spouse and mother. Continue current medications, follow-up on wound care. SCI education with patient and family.     Medical Summary Current Status: encephalitis/myelitis with incomplete paraplegia- LBM 10/8- cannot feel BM- LBM actually this AM with PT- didn't know had it; ; pain much better with lidoderm;    penile wounds  Barriers to Discharge: Wound care;Weight bearing restrictions;Behavior;Home enviroment access/layout;Neurogenic Bowel &  Bladder;Incontinence;Medical stability  Barriers to Discharge Comments: home with wife/mother Possible Resolutions to [employer] Focus: SB transfers; total A toileting;  focus- education on SCI and what this means for him- d/c 10/27?   Continued Need for Acute  Rehabilitation Level of Care: The patient requires daily medical management by a physician with specialized training in physical medicine and rehabilitation for the following reasons: Direction of a multidisciplinary physical rehabilitation program to maximize functional independence : Yes Medical management of patient stability for increased activity during participation in an intensive rehabilitation regime.: Yes Analysis of laboratory values and/or radiology reports with any subsequent need for medication adjustment and/or medical intervention. : Yes   I attest that I was present, lead the team conference, and concur with the assessment and plan of the team.   Tennis Must 04/10/2021, 8:33 AM

## 2021-04-10 NOTE — Progress Notes (Signed)
PROGRESS NOTE   Subjective/Complaints:  Pt reports slept well overnight- has therapy this AM at 7:30 am. Pooped a large BM yesterday AM- not later in day.  Asking about disability paperwork to be filled out.    ROS:  Pt denies SOB, abd pain, CP, N/V/C/D, and vision changes   Objective:   No results found. Recent Labs    04/08/21 0620  WBC 5.9  HGB 14.6  HCT 42.8  PLT 272   Recent Labs    04/08/21 0620  NA 134*  K 4.3  CL 99  CO2 26  GLUCOSE 104*  BUN 27*  CREATININE 1.00  CALCIUM 9.3    Intake/Output Summary (Last 24 hours) at 04/10/2021 0933 Last data filed at 04/09/2021 2300 Gross per 24 hour  Intake 360 ml  Output 1200 ml  Net -840 ml        Physical Exam: Vital Signs Blood pressure (!) 114/57, pulse (!) 55, temperature 98.1 F (36.7 C), resp. rate 16, height 5\' 11"  (1.803 m), weight 71.5 kg, SpO2 98 %.      General: awake, alert, appropriate, initially asleep; wife at bedside in chair; NAD HENT: conjugate gaze; oropharynx moist CV: regular rhythm; slightly bradycardic rate; no JVD Pulmonary: CTA B/L; no W/R/R- good air movement GI: soft, NT, ND, (+)BS Psychiatric: appropriate; flatter affect today Neurological: alert No significant change in strength exam as below.  Extremities: No clubbing, cyanosis, or edema. Pulses are 2+ Skin: numerous tats, left heel ulcer with foam dressing Neuro:  Alert and oriented x 3. Normal insight and awareness. Intact Memory. Normal language and speech. Cranial nerve exam unremarkable. Decreased LT/pain sense aroung T5-6 with legs more affected than trunk. UE 4+/5. LLE 1+ to 2- HE, KE, 1/5 APF, 0/5 ADF. RLE 0/5. No resting tone. DTR's trace Musculoskeletal: minimal pain with limb rom. Heel cords a little tight    Assessment/Plan: 1. Functional deficits which require 3+ hours per day of interdisciplinary therapy in a comprehensive inpatient rehab  setting. Physiatrist is providing close team supervision and 24 hour management of active medical problems listed below. Physiatrist and rehab team continue to assess barriers to discharge/monitor patient progress toward functional and medical goals  Care Tool:  Bathing    Body parts bathed by patient: Right arm, Left arm, Chest, Abdomen, Face   Body parts bathed by helper: Right lower leg, Left lower leg Body parts n/a: Left lower leg, Right lower leg, Left upper leg, Right upper leg, Buttocks, Front perineal area   Bathing assist Assist Level: Minimal Assistance - Patient > 75%     Upper Body Dressing/Undressing Upper body dressing   What is the patient wearing?: Pull over shirt    Upper body assist Assist Level: Set up assist    Lower Body Dressing/Undressing Lower body dressing      What is the patient wearing?: Pants, Incontinence brief     Lower body assist Assist for lower body dressing: Maximal Assistance - Patient 25 - 49%     Toileting Toileting Toileting Activity did not occur (Clothing management and hygiene only): N/A (no void or bm)  Toileting assist Assist for toileting: Total Assistance - Patient <  25%     Transfers Chair/bed transfer  Transfers assist     Chair/bed transfer assist level: Minimal Assistance - Patient > 75% (squat pivot) Chair/bed transfer assistive device: Sliding board   Locomotion Ambulation   Ambulation assist   Ambulation activity did not occur: Safety/medical concerns          Walk 10 feet activity   Assist  Walk 10 feet activity did not occur: Safety/medical concerns        Walk 50 feet activity   Assist Walk 50 feet with 2 turns activity did not occur: Safety/medical concerns         Walk 150 feet activity   Assist Walk 150 feet activity did not occur: Safety/medical concerns         Walk 10 feet on uneven surface  activity   Assist Walk 10 feet on uneven surfaces activity did not occur:  Safety/medical concerns         Wheelchair     Assist Is the patient using a wheelchair?: Yes Type of Wheelchair: Manual    Wheelchair assist level: Supervision/Verbal cueing Max wheelchair distance: 39'    Wheelchair 50 feet with 2 turns activity    Assist        Assist Level: Supervision/Verbal cueing   Wheelchair 150 feet activity     Assist      Assist Level: Maximal Assistance - Patient 25 - 49% (per PT documentation)   Blood pressure (!) 114/57, pulse (!) 55, temperature 98.1 F (36.7 C), resp. rate 16, height 5\' 11"  (1.803 m), weight 71.5 kg, SpO2 98 %. Medical Problem List and Plan: 1.   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  Antibiotic course completed..  Follow-up with ID as outpatient with immunologic work-up and viral serology             -patient may  shower             -ELOS/Goals: 21-24 days, min assist at w/c level  Patient is beginning CIR therapies today including PT and OT   -demonstrated improvement in LLE motor this morning  Con't PT and OT- d/c date set as 10/27  2.  Antithrombotics: -DVT/anticoagulation:  Pharmaceutical: Lovenox.  Check vascular study  10/11- vascular study (-)  10/12- will need Lovenox for 3 months total due to risk of DVT being so high.              -antiplatelet therapy: N/A 3. Pain Management: Tylenol/Advil as needed  10/10- will add lidoderm patches- 3 of them 8pm to 8am- if not enough, might try tylenol #3- wouldn't want tramadol if had encephalitis.   10/11- pt reports pain somewhat better- doesn't "want more pills".  4. Mood: Provide emotional support             -antipsychotic agents: N/A 5. Neuropsych: This patient is capable of making decisions on his own behalf. 6. Skin/Wound Care: Routine skin checks             -reiterated to pt that he needs to wear PRAFO's for heel protection and to stretch heel cords  10/10- wasn't wearing will encourage.  7.  Fluids/Electrolytes/Nutrition: Routine in and outs with follow-up chemistries 8.  Seizure prophylaxis.  Keppra 500 mg twice daily 9.  Sinus bradycardia.  Echocardiogram unremarkable.  No further work-up indicated per cardiology services 10.  Neurogenic bladder/Urinary retention.  Failed voiding trial.               -  maintain foley for a few more days -Continue Flomax.  Repeat voiding trial as mobility improves 10/12- remove foley in AM- and see if can void or not 11. Neurogenic bowel: begin scheduled senokot-s at HS.             -miralax daily             -consider bowel program depending upon continence and regularity             -pt reports no sense of bowel emptying currently.  10/10- will give Sorbitol this afternoon- might need bowel program if has incontinence  10/11- start bowel program after giving 60cc of Sorbitol today- just suppository for now after meals.   10/12- had BM yesterday AM- completely unaware of it- so really needs bowel program with dig stim. Will verify he's scheduled for it.  12. At level SCI pain  10/10- around ribs- feels so tight- it's due to at level SCI pain- starting Lidoderm.  Might need nerve pain meds.   10/11- somewhat better with patches- con't regimen  13. Dispo 10/12- pt asking for disability paperwork to be done     LOS: 4 days A FACE TO FACE EVALUATION WAS PERFORMED  James Obrien 04/10/2021, 9:33 AM

## 2021-04-11 MED ORDER — LIDOCAINE HCL URETHRAL/MUCOSAL 2 % EX GEL: 1.0000 "application " | CUTANEOUS | Status: DC | PRN

## 2021-04-11 NOTE — Progress Notes (Signed)
Occupational Therapy Session Note  Patient Details  Name: James Obrien MRN: [id number] Date of Birth: 1985-07-09  Today's Date: 04/11/2021 OT Individual Time: 3614-4315 and 1600-1630 OT Individual Time Calculation (min): 41 min and 30 min   Short Term Goals: Week 1:  OT Short Term Goal 1 (Week 1): patient will complete lower body bathing dressing at bed level with min A using assistive devices OT Short Term Goal 2 (Week 1): patient will complete upper body bathing and dressing w/c level with set up OT Short Term Goal 3 (Week 1): patient will sit unsupported for 10+ minutes with min A OT Short Term Goal 4 (Week 1): patient will complete SB transfers with mod A of one   Skilled Therapeutic Interventions/Progress Updates:    Session 1:Pt greeted at time of session up in wheelchair, ADL needs met and no c/o pain. Self propel partially to elevators with therapist resuming and transported to ortho gym for time. Focused on standing in [place] at [facility] for 3 rounds of dynamic "standing" perched in [place] for reaching out of BOS and with several LOB, able to right self using UE on Stedy. Improved core strength compared to previous sessions but still with several LOB. Sit <> stand in Stedy 1x10 with tapping at quads and glutes in attempt to improve contractions and to facilitate weight bearing through BLEs. Stedy > mat and squat pivot mat > wheelchair Min A. Back in room, squat pivot to bed same manner. Alarm on call bell in reach.   Session 2: Pt greeted at time of session sitting up in wheelchair agreeable to OT session, no pain reported. Pt stating he did have urge to urinate, as he had catheter removed this am. Self propel > bathroom and Min A squat pivot w/c <> BSC over toilet for additional surface area. Clothing management Mod/Max A to doff to knee level with pt pushing through Ue's to lift buttocks. Attempted to have pt perform lateral leans but did not want to do so. Unable to void despite water  sounds and extended time. Squat pivot BSC > wheelchair > bed Min A. Educated on pacing, form, and foot placement for transfers. Alarm on call bell in reach. RN aware pt unable to void on commode, planning to bladder scan.   Therapy Documentation Precautions:  Precautions Precautions: Fall Precaution Comments: paraparesis; check BP Other Brace: TED hose Restrictions Weight Bearing Restrictions: No     Therapy/Group: Individual Therapy  [name] 04/11/2021, 7:19 AM

## 2021-04-11 NOTE — Progress Notes (Addendum)
Occupational Therapy Session Note  Patient Details  Name: James Obrien MRN: [id number] Date of Birth: 10-27-1985  Today's Date: 04/11/2021 OT Individual Time: 7262-0355 OT Individual Time Calculation (min): 31 min    Short Term Goals: Week 1:  OT Short Term Goal 1 (Week 1): patient will complete lower body bathing dressing at bed level with min A using assistive devices OT Short Term Goal 2 (Week 1): patient will complete upper body bathing and dressing w/c level with set up OT Short Term Goal 3 (Week 1): patient will sit unsupported for 10+ minutes with min A OT Short Term Goal 4 (Week 1): patient will complete SB transfers with mod A of one  Skilled Therapeutic Interventions/Progress Updates:  Patient met lying supine in bed in agreement with OT treatment session. 0/10 pain reported at rest and with activity. Patient declined LB ADLs but expressed desire to complete UB bathing. Task completed with patient in unsupported sitting position at EOB. Noted 1 posterior LOB but patient able to correct. Squat-pivot to and from wc. Patient aware of need to lock wheelchair breaks. Able to manage leg rests and catheter bag with Min A. Per patient, foley to be removed tomorrow. Patient able to utilize gait belt to assist BLE from EOB to bed surface with return to supine requiring Min A to adjust BLE once in bed. Patient missed 14 minutes of OT treatment session as MD present in room for a.m. visit. Time adjusted accordingly. Session concluded with patient in long sitting in bed and set-up for breakfast meal. All needs within reach and bed alarm activated.   Therapy Documentation Precautions:  Precautions Precautions: Fall Precaution Comments: paraparesis; check BP Other Brace: TED hose Restrictions Weight Bearing Restrictions: No General: General OT Amount of Missed Time: 14 Minutes Therapy/Group: Individual Therapy  [name] 04/11/2021, 6:45 AM

## 2021-04-11 NOTE — Progress Notes (Signed)
Bowel program initiated dig Stim performed with bisacodyl suppository inserted dig stim performed x1 with now bowel program expressed. Will notify oncoming shift of incomplete Bowel program

## 2021-04-11 NOTE — Progress Notes (Signed)
Occupational Therapy Session Note  Patient Details  Name: James Obrien MRN: [id number] Date of Birth: 1986-06-30  Today's Date: 04/12/2021 OT Individual Time: 9233-0076 and 1450-1535 OT Individual Time Calculation (min): 55 min and 45 min   Short Term Goals: Week 1:  OT Short Term Goal 1 (Week 1): patient will complete lower body bathing dressing at bed level with min A using assistive devices OT Short Term Goal 2 (Week 1): patient will complete upper body bathing and dressing w/c level with set up OT Short Term Goal 3 (Week 1): patient will sit unsupported for 10+ minutes with min A OT Short Term Goal 4 (Week 1): patient will complete SB transfers with mod A of one  Skilled Therapeutic Interventions/Progress Updates:    Pt greeted in bed with no c/o pain. Min A for donning pants + gripper socks bedlevel. Setup for supine<sit with HOB elevated and CGA for squat pivot<w/c. Taught pt how to apply leg rests himself with vcs to lock brakes in prep for dynamic sitting for decreasing fall risk. He then self propelled the w/c to the atrium area, worked on obstacle negotiation in the gift shop and IADL retraining via ordering food at [place] at seated level. OT fashioned a gait belt around his leg rests to help with LE positioning during mobility. He plans to independently purchase some w/c gloves. Discussed also purchasing accessories for the w/c to help with safety and independence during IADLs such as a cup holder + lap tray. Pt then self propelled back the room and remained sitting up for lunch. All needs within reach.   2nd Session 1:1 tx (45 min) Pt greeted in the w/c with no c/o pain. Agreeable to session. Worked on UB strengthening/endurance and w/c navigation while self propelling the w/c outdoors over uneven concrete and around natural obstacles in the environment. Taught pt LE stretches to perform using the gait belt, pt self limiting in regards to the stretches that he was willing to  attempt. Pt then self propelled back to his room and completed a toilet transfer using drop arm BSC over toilet given CGA lateral scoot as pt reported he was having the urge to urinate. Pt remained sitting on the toilet to void at close of session. Pt reported that he would notify staff when he was ready to be transferred off of the toilet. Informed RN of pts position.   Therapy Documentation Precautions:  Precautions Precautions: Fall Precaution Comments: paraparesis; check BP Other Brace: TED hose Restrictions Weight Bearing Restrictions: No   ADL: ADL Eating: Set up Where Assessed-Eating: Bed level Grooming: Setup Where Assessed-Grooming: Edge of bed Upper Body Bathing: Setup Where Assessed-Upper Body Bathing: Edge of bed Lower Body Bathing: Maximal assistance Where Assessed-Lower Body Bathing: Bed level Upper Body Dressing: Minimal assistance Where Assessed-Upper Body Dressing: Edge of bed Lower Body Dressing: Maximal assistance Where Assessed-Lower Body Dressing: Bed level Toileting: Not assessed Where Assessed-Toileting: Bed level Toilet Transfer: Not assessed ADL Comments: requires max A to maintain sitting edge of bed for grooming and upper body adl            Therapy/Group: Individual Therapy  Tavoris Brisk A Lashena Signer 04/12/2021, 12:35 PM

## 2021-04-11 NOTE — Progress Notes (Signed)
Physical Therapy Session Note  Patient Details  Name: James Obrien MRN: [id number] Date of Birth: [birth year]  Today's Date: 04/11/2021 PT Individual Time: 4818-5631, [address] PT Individual Time Calculation (min): 60 min   Short Term Goals: Week 1:  PT Short Term Goal 1 (Week 1): Pt will perform bed mobility with modA +1. PT Short Term Goal 2 (Week 1): Pt will perform bed to chair transfer with modA +1. PT Short Term Goal 3 (Week 1): Pt will maintain static sitting balance for 5 minutes with close supervision.  Skilled Therapeutic Interventions/Progress Updates:    Session 1: pt received in bed and agreeable to therapy. No complaint of pain. Supine>sit with min A for LE management and cueing for technique. Squat pivot transfer to w/c with mod A and VC for technique. Pt propelled w/c with BUE to therapy gym, ~80 ft with supervision. Session focused on e stim to BIL quads to assess muscle function, reduce disuse atrophy, and promote functional return. [place] e-stim unit on NMES setting used throughout. Pt demoed strong contraction of L quad at 62 mA. R quad had minimal activity at 79 mA, with slight lift but overall poor response. Discussed implications for functional return and d/c outlook. Slight redness noted when removing pads, which resolved by next session. Pt reported no discomfort throughout. Pt returned to room and remained in w/c, was left with all needs in reach and alarm active.   Session 2: pt received in bed and agreeable to therapy. No complaint of pain. Supine>sit with min A for LE management and cueing for technique. Retrieved standard manual w/c and lightweight chair to trial. Squat pivot to manual w/c with min A and mod cues for technique. Pt propelled w/c with BUE x 300 ft with supervision, including navigating obstacles. Pt demoed poor trunk control in sling back of standard chair, so trialled light weight chair. Squat pivot w/c>bed>w/c in the same manner as above.  Pt demoed improved positioning and postural control in lightweight chair with tension back adjusted to provide appropriate level of support. Pt propelled w/c with BUE x 200 ft, demoing improved maneuverability, improved postural support providing higher level of independence d/t safer positioning. Pt remained seated in w/c at end of session and was left with all needs in reach and alarm active.   Therapy Documentation Precautions:  Precautions Precautions: Fall Precaution Comments: paraparesis; check BP Other Brace: TED hose Restrictions Weight Bearing Restrictions: No    Therapy/Group: Individual Therapy  [name] 04/11/2021, 3:57 PM

## 2021-04-11 NOTE — Progress Notes (Signed)
Sleep well this pm. Refused Lidocaine patch. Had a medium bowel movement @ 8:30pm after suppository and digital stimulation with Day staff. Denies pain. Safety maintained at all times.

## 2021-04-11 NOTE — Progress Notes (Signed)
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   Did bowel program- had BM later in evening.  Didn't wear patches- doing OK  with pain.   Tingling in RLE- "heavier" feeling.      ROS:  Pt denies SOB, abd pain, CP, N/V/C/D, and vision changes   Objective:   No results found. No results for input(s): WBC, HGB, HCT, PLT in the last 72 hours.  No results for input(s): NA, K, CL, CO2, GLUCOSE, BUN, CREATININE, CALCIUM in the last 72 hours.   Intake/Output Summary (Last 24 hours) at 04/11/2021 0836 Last data filed at 04/11/2021 0600 Gross per 24 hour  Intake 240 ml  Output 1100 ml  Net -860 ml        Physical Exam: Vital Signs Blood pressure 112/67, pulse (!) 56, temperature 98.5 F (36.9 C), temperature source Oral, resp. rate 20, height 5\' 11"  (1.803 m), weight 71.5 kg, SpO2 98 %.       General: awake, alert, appropriate, sitting up in w/c with therapy in room;  NAD HENT: conjugate gaze; oropharynx moist CV: regular rate; no JVD Pulmonary: CTA B/L; no W/R/R- good air movement GI: soft, NT, ND, (+)BS Psychiatric: appropriate Neurological: Ox3  Extremities: No clubbing, cyanosis, or edema. Pulses are 2+ Skin: numerous tats, left heel ulcer with foam dressing Neuro:  Alert and oriented x 3. Normal insight and awareness. Intact Memory. Normal language and speech. Cranial nerve exam unremarkable. Decreased LT/pain sense aroung T5-6 with legs more affected than trunk. UE 4+/5. LLE 1+ to 2- HE, KE, 1/5 APF, 0/5 ADF. RLE 0/5. No resting tone. DTR's trace Musculoskeletal: minimal pain with limb rom. Heel cords a little tight    Assessment/Plan: 1. Functional deficits which require 3+ hours per day of interdisciplinary therapy in a  comprehensive inpatient rehab setting. Physiatrist is providing close team supervision and 24 hour management of active medical problems listed below. Physiatrist and rehab team continue to assess barriers to discharge/monitor patient progress toward functional and medical goals  Care Tool:  Bathing    Body parts bathed by patient: Right arm, Left arm, Chest, Abdomen, Face   Body parts bathed by helper: Right lower leg, Left lower leg Body parts n/a: Left lower leg, Right lower leg, Left upper leg, Right upper leg, Buttocks, Front perineal area   Bathing assist Assist Level: Minimal Assistance - Patient > 75%     Upper Body Dressing/Undressing Upper body dressing   What is the patient wearing?: Pull over shirt    Upper body assist Assist Level: Set up assist    Lower Body Dressing/Undressing Lower body dressing      What is the patient wearing?: Pants, Incontinence brief     Lower body assist Assist for lower body dressing: Maximal Assistance - Patient 25 - 49%     Toileting Toileting Toileting Activity did not occur (Clothing management and hygiene only): N/A (no void or bm)  Toileting assist Assist for toileting: Total Assistance - Patient < 25%  Transfers Chair/bed transfer  Transfers assist     Chair/bed transfer assist level: Minimal Assistance - Patient > 75% (squat pivot) Chair/bed transfer assistive device: Sliding board   Locomotion Ambulation   Ambulation assist   Ambulation activity did not occur: Safety/medical concerns          Walk 10 feet activity   Assist  Walk 10 feet activity did not occur: Safety/medical concerns        Walk 50 feet activity   Assist Walk 50 feet with 2 turns activity did not occur: Safety/medical concerns         Walk 150 feet activity   Assist Walk 150 feet activity did not occur: Safety/medical concerns         Walk 10 feet on uneven surface  activity   Assist Walk 10 feet on uneven  surfaces activity did not occur: Safety/medical concerns         Wheelchair     Assist Is the patient using a wheelchair?: Yes Type of Wheelchair: Manual    Wheelchair assist level: Supervision/Verbal cueing Max wheelchair distance: 31'    Wheelchair 50 feet with 2 turns activity    Assist        Assist Level: Supervision/Verbal cueing   Wheelchair 150 feet activity     Assist      Assist Level: Maximal Assistance - Patient 25 - 49% (per PT documentation)   Blood pressure 112/67, pulse (!) 56, temperature 98.5 F (36.9 C), temperature source Oral, resp. rate 20, height 5\' 11"  (1.803 m), weight 71.5 kg, SpO2 98 %. Medical Problem List and Plan: 1.   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  Antibiotic course completed..  Follow-up with ID as outpatient with immunologic work-up and viral serology             -patient may  shower             -ELOS/Goals: 21-24 days, min assist at w/c level  -con't PT and OT- CIR- will check into disability paperwork- went over possible prognosis this AM- will take 18 months to get back what HE gets back- doesn't mean 100% 2.  Antithrombotics: -DVT/anticoagulation:  Pharmaceutical: Lovenox.  Check vascular study  10/11- vascular study (-)  10/12- will need Lovenox for 3 months total due to risk of DVT being so high.              -antiplatelet therapy: N/A 3. Pain Management: Tylenol/Advil as needed  10/10- will add lidoderm patches- 3 of them 8pm to 8am- if not enough, might try tylenol #3- wouldn't want tramadol if had encephalitis.   10/11- pt reports pain somewhat better- doesn't "want more pills".   10/13- pain doing better- didn't wear lidocaine patches- con't regimen prn 4. Mood: Provide emotional support             -antipsychotic agents: N/A 5. Neuropsych: This patient is capable of making decisions on his own behalf. 6. Skin/Wound Care: Routine skin checks             -reiterated to pt  that he needs to wear PRAFO's for heel protection and to stretch heel cords  10/10- wasn't wearing will encourage.  7. Fluids/Electrolytes/Nutrition: Routine in and outs with follow-up chemistries 8.  Seizure prophylaxis.  Keppra 500 mg twice daily 9.  Sinus bradycardia.  Echocardiogram unremarkable.  No further work-up indicated per cardiology services 10.  Neurogenic bladder/Urinary retention.  Failed voiding trial.               -  maintain foley for a few more days -Continue Flomax.  Repeat voiding trial as mobility improves 10/12- remove foley in AM- and see if can void or not  10/13- will remove foley today- and cath if needed- if volumes >300cc- on Flomax, so might pee? Lidocaine for caths 11. Neurogenic bowel: begin scheduled senokot-s at HS.             -miralax daily             -consider bowel program depending upon continence and regularity             -pt reports no sense of bowel emptying currently.  10/10- will give Sorbitol this afternoon- might need bowel program if has incontinence  10/11- start bowel program after giving 60cc of Sorbitol today- just suppository for now after meals.   10/13- bowel program last night- good results- will con't 12. At level SCI pain  10/10- around ribs- feels so tight- it's due to at level SCI pain- starting Lidoderm.  Might need nerve pain meds.   10/11- somewhat better with patches- con't regimen  13. Dispo 10/12- pt asking for disability paperwork to be done 10/13- will ask SW about paperwork- discussed prognosis   I spent a total of 40 minutes on total care- >50% on coordination of care- talking with pt about prognosis- Nursing and SW.      LOS: 5 days A FACE TO FACE EVALUATION WAS PERFORMED  James Obrien 04/11/2021, 8:36 AM

## 2021-04-12 NOTE — Progress Notes (Signed)
PROGRESS NOTE   Subjective/Complaints:  Pt reports extra large BM with bowel program-  Cannot pee on own so far Feels like has the urge, but cannot actually void yet- requiring in/out caths.  Got new w/c on unit- fits much better.   Spoke with PT about using Stall's if possible and teaching pressure relief q20 minutes.   ROS:  Pt denies SOB, abd pain, CP, N/V/C/D, and vision changes   Objective:   No results found. No results for input(s): WBC, HGB, HCT, PLT in the last 72 hours.  No results for input(s): NA, K, CL, CO2, GLUCOSE, BUN, CREATININE, CALCIUM in the last 72 hours.   Intake/Output Summary (Last 24 hours) at 04/12/2021 1836 Last data filed at 04/12/2021 1818 Gross per 24 hour  Intake 1184 ml  Output 2100 ml  Net -916 ml        Physical Exam: Vital Signs Blood pressure 127/76, pulse (!) 50, temperature (!) 97.3 F (36.3 C), temperature source Oral, resp. rate 16, height 5\' 11"  (1.803 m), weight 71.5 kg, SpO2 100 %.        General: awake, alert, appropriate, sitting up in bed; wife at side; NAD HENT: conjugate gaze; oropharynx moist CV: regular rhythm; bradycardic rate; no JVD Pulmonary: CTA B/L; no W/R/R- good air movement GI: soft, NT, ND, (+)BS- normoactive today Psychiatric: appropriate- brighter affect Neurological: Ox3  Extremities: No clubbing, cyanosis, or edema. Pulses are 2+ Skin: numerous tats, left heel ulcer with foam dressing Neuro:  Alert and oriented x 3. Normal insight and awareness. Intact Memory. Normal language and speech. Cranial nerve exam unremarkable. Decreased LT/pain sense aroung T5-6 with legs more affected than trunk. UE 4+/5. LLE 1+ to 2- HE, KE, 1/5 APF, 0/5 ADF. RLE 0/5. No resting tone. DTR's trace Musculoskeletal: minimal pain with limb rom. Heel cords a little tight    Assessment/Plan: 1. Functional deficits which require 3+ hours per day of interdisciplinary  therapy in a comprehensive inpatient rehab setting. Physiatrist is providing close team supervision and 24 hour management of active medical problems listed below. Physiatrist and rehab team continue to assess barriers to discharge/monitor patient progress toward functional and medical goals  Care Tool:  Bathing    Body parts bathed by patient: Right arm, Left arm, Chest, Abdomen, Face   Body parts bathed by helper: Right lower leg, Left lower leg Body parts n/a: Left lower leg, Right lower leg, Left upper leg, Right upper leg, Buttocks, Front perineal area   Bathing assist Assist Level: Minimal Assistance - Patient > 75%     Upper Body Dressing/Undressing Upper body dressing   What is the patient wearing?: Pull over shirt    Upper body assist Assist Level: Set up assist    Lower Body Dressing/Undressing Lower body dressing      What is the patient wearing?: Pants, Incontinence brief     Lower body assist Assist for lower body dressing: Maximal Assistance - Patient 25 - 49%     Toileting Toileting Toileting Activity did not occur (Clothing management and hygiene only): N/A (no void or bm)  Toileting assist Assist for toileting: Maximal Assistance - Patient 25 - 49%  Transfers Chair/bed transfer  Transfers assist     Chair/bed transfer assist level: Minimal Assistance - Patient > 75% (squat pivot) Chair/bed transfer assistive device: Sliding board   Locomotion Ambulation   Ambulation assist   Ambulation activity did not occur: Safety/medical concerns          Walk 10 feet activity   Assist  Walk 10 feet activity did not occur: Safety/medical concerns        Walk 50 feet activity   Assist Walk 50 feet with 2 turns activity did not occur: Safety/medical concerns         Walk 150 feet activity   Assist Walk 150 feet activity did not occur: Safety/medical concerns         Walk 10 feet on uneven surface  activity   Assist Walk 10  feet on uneven surfaces activity did not occur: Safety/medical concerns         Wheelchair     Assist Is the patient using a wheelchair?: Yes Type of Wheelchair: Manual    Wheelchair assist level: Supervision/Verbal cueing Max wheelchair distance: 71'    Wheelchair 50 feet with 2 turns activity    Assist        Assist Level: Supervision/Verbal cueing   Wheelchair 150 feet activity     Assist      Assist Level: Maximal Assistance - Patient 25 - 49% (per PT documentation)   Blood pressure 127/76, pulse (!) 50, temperature (!) 97.3 F (36.3 C), temperature source Oral, resp. rate 16, height 5\' 11"  (1.803 m), weight 71.5 kg, SpO2 100 %. Medical Problem List and Plan: 1.   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  Antibiotic course completed..  Follow-up with ID as outpatient with immunologic work-up and viral serology             -patient may  shower             -ELOS/Goals: 21-24 days, min assist at w/c level  -con't PT and OT- CIR- will check into disability paperwork- went over possible prognosis this AM- will take 18 months to get back what HE gets back- doesn't mean 100%  Con't PT and OT/CIR- try to use Stall's for w/c; also taught pt about pressure relief q20 minutes.  2.  Antithrombotics: -DVT/anticoagulation:  Pharmaceutical: Lovenox.  Check vascular study  10/11- vascular study (-)  10/12- will need Lovenox for 3 months total due to risk of DVT being so high.              -antiplatelet therapy: N/A 3. Pain Management: Tylenol/Advil as needed  10/10- will add lidoderm patches- 3 of them 8pm to 8am- if not enough, might try tylenol #3- wouldn't want tramadol if had encephalitis.   10/11- pt reports pain somewhat better- doesn't "want more pills".   10/13- pain doing better- didn't wear lidocaine patches- con't regimen prn 4. Mood: Provide emotional support             -antipsychotic agents: N/A 5. Neuropsych: This patient is  capable of making decisions on his own behalf. 6. Skin/Wound Care: Routine skin checks             -reiterated to pt that he needs to wear PRAFO's for heel protection and to stretch heel cords  10/10- wasn't wearing will encourage.  7. Fluids/Electrolytes/Nutrition: Routine in and outs with follow-up chemistries 8.  Seizure prophylaxis.  Keppra 500 mg twice daily 9.  Sinus bradycardia.  Echocardiogram unremarkable.  No further work-up indicated per cardiology services  10/14- heart rate still in low 50s- asymptomatic 10.  Neurogenic bladder/Urinary retention.  Failed voiding trial.               -maintain foley for a few more days -Continue Flomax.  Repeat voiding trial as mobility improves 10/12- remove foley in AM- and see if can void or not  10/13- will remove foley today- and cath if needed- if volumes >300cc- on Flomax, so might pee? Lidocaine for caths  10/14- requiring in/out caths- explained I expected this- on Flomax- taught suprapubic tapping.  11. Neurogenic bowel: begin scheduled senokot-s at HS.             -miralax daily             -consider bowel program depending upon continence and regularity             -pt reports no sense of bowel emptying currently.  10/14- extra large BM with bowel program- con't regimen 12. At level SCI pain  10/10- around ribs- feels so tight- it's due to at level SCI pain- starting Lidoderm.  Might need nerve pain meds.   10/11- somewhat better with patches- con't regimen  13. Dispo 10/12- pt asking for disability paperwork to be done 10/13- will ask SW about paperwork- discussed prognosis 10/14- L/M for Sw about disability paperwork  LOS: 6 days A FACE TO FACE EVALUATION WAS PERFORMED  Shalaunda Weatherholtz 04/12/2021, 6:36 PM

## 2021-04-12 NOTE — Progress Notes (Signed)
Patient had an explosive Bowel movement (XL) after suppository insertion by day shift staff. Brown, formed with mushy consistency. Cleaned up and made comfortable in bed.

## 2021-04-12 NOTE — Progress Notes (Signed)
Physical Therapy Session Note  Patient Details  Name: James Obrien MRN: [id number] Date of Birth: 1986-06-18  Today's Date: 04/12/2021 PT Individual Time: 0800-0900,1300-1345 PT Individual Time Calculation (min): 60 min, 45 min   Short Term Goals: Week 1:  PT Short Term Goal 1 (Week 1): Pt will perform bed mobility with modA +1. PT Short Term Goal 2 (Week 1): Pt will perform bed to chair transfer with modA +1. PT Short Term Goal 3 (Week 1): Pt will maintain static sitting balance for 5 minutes with close supervision.  Skilled Therapeutic Interventions/Progress Updates:    Session 1: pt received in bed and agreeable to therapy. No complaint of pain. Pt required min A for bed mobility for LE management. Pt propelled w/c with BUE x >200 ft with supervision. Pt educated on pressure relief techniques, including time and frequency. Pt demonstrated anterior weight shift, lateral leans, and w/c push up, and stated he believes anterior weight shift will be easiest for him. Pt then transitioned to // bars and stood x 4 with mod A x 4. Therapist utilized gait belt around hips to promote hip extension and provided knee block. Pt then attempted weight shifting in standing with assist to facilitate weight shift. Muscle activation noted in quads in standing, but not functional at this point. Pt returned to room and remained in w/c with chair alarm active and all need in reach.   Session 2: Pt seated w/c on arrival with his wife present, No complaint of pain. Nsg present to address heel wound and replace dressing. Discussed pressure relief options with RN, with plan to request order for prevalon boots for increased skin protection. Pt reported that he had forgotten to wear prafo boots the previous night. Pt's wife reports that pressure wound on heel appears more discolored. Discussed shoe options to protect heel during therapy.   Therapy Documentation Precautions:  Precautions Precautions:  Fall Precaution Comments: paraparesis; check BP Other Brace: TED hose Restrictions Weight Bearing Restrictions: No     Therapy/Group: Individual Therapy  [name] 04/12/2021, 2:16 PM

## 2021-04-12 NOTE — Progress Notes (Signed)
Urge to void present but couldn't micturate. Intermittent cath via urethra performed. 600 mls urine obtained (procedure Sterile). Safety maintained.

## 2021-04-12 NOTE — Progress Notes (Signed)
Bowel program initiated with dig stim and bisacodyl suppository inserted no bowel movement expressed will notify oncoming nurse bowel program incomplete

## 2021-04-13 DIAGNOSIS — K592 Neurogenic bowel, not elsewhere classified: Secondary | ICD-10-CM

## 2021-04-13 DIAGNOSIS — E871 Hypo-osmolality and hyponatremia: Secondary | ICD-10-CM

## 2021-04-13 DIAGNOSIS — N319 Neuromuscular dysfunction of bladder, unspecified: Secondary | ICD-10-CM

## 2021-04-13 DIAGNOSIS — R799 Abnormal finding of blood chemistry, unspecified: Secondary | ICD-10-CM

## 2021-04-13 LAB — CREATININE, SERUM
Creatinine, Ser: 0.92 mg/dL (ref 0.61–1.24)
GFR, Estimated: >60 mL/min (ref >60)

## 2021-04-13 MED ORDER — TAMSULOSIN HCL 0.4 MG PO CAPS
0.8000 mg | ORAL_CAPSULE | Freq: Every day | ORAL | Status: DC
  Administered 2021-04-14 – 2021-04-25 (×12): 0.8 mg via ORAL
  Filled 2021-04-13 (×12): qty 2

## 2021-04-13 NOTE — Progress Notes (Signed)
Bowel program initiated at 1842 with dig Stim performed with  bisacodyl suppository inserted with now bowel movement expressed with bowel digital stimulation will notify oncoming nurse of incomplete bowel program

## 2021-04-13 NOTE — Progress Notes (Signed)
PROGRESS NOTE   Subjective/Complaints: Patient seen laying in bed this morning.  He states he slept well overnight for the most part.  Patient unable to be cathed overnight, so cath or 1000 cc this a.m., discussed with nursing.  Discussed PRAFO with nursing as well.  ROS: Denies CP, SOB, N/V/D  Objective:   No results found. No results for input(s): WBC, HGB, HCT, PLT in the last 72 hours.  Recent Labs    04/13/21 0336  CREATININE 0.92     Intake/Output Summary (Last 24 hours) at 04/13/2021 1114 Last data filed at 04/13/2021 0939 Gross per 24 hour  Intake 912 ml  Output 1550 ml  Net -638 ml      Pressure Injury 04/12/21 Heel Right Deep Tissue Pressure Injury - Purple or maroon localized area of discolored intact skin or blood-filled blister due to damage of underlying soft tissue from pressure and/or shear. Konrad Dolores RN  verification (Active)  04/12/21 1045  Location: Heel  Location Orientation: Right  Staging: Deep Tissue Pressure Injury - Purple or maroon localized area of discolored intact skin or blood-filled blister due to damage of underlying soft tissue from pressure and/or shear.  Wound Description (Comments): Konrad Dolores RN  verification  Present on Admission: Yes    Physical Exam: Vital Signs Blood pressure 120/76, pulse (!) 54, temperature 98.1 F (36.7 C), temperature source Oral, resp. rate 18, height 5\' 11"  (1.803 m), weight 71.5 kg, SpO2 98 %. Constitutional: No distress . Vital signs reviewed. HENT: Normocephalic.  Atraumatic. Eyes: EOMI. No discharge. Cardiovascular: No JVD.  RRR. Respiratory: Normal effort.  No stridor.  Bilateral clear to auscultation. GI: Non-distended.  BS +. Skin: Warm and dry.  Large DTI right heel. Psych: Normal mood.  Normal behavior. Musc: No edema in extremities.  No tenderness in extremities. Neurological: Alert and oriented Decreased LT/pain sense aroung  T5-6 with legs more affected than trunk, unchanged.  Motor: LLE 2/5 HE, KE, 1/5 ADF.  RLE abduction, knee extension 1+/5, ankle dorsiflexion 0/5   Assessment/Plan: 1. Functional deficits which require 3+ hours per day of interdisciplinary therapy in a comprehensive inpatient rehab setting. Physiatrist is providing close team supervision and 24 hour management of active medical problems listed below. Physiatrist and rehab team continue to assess barriers to discharge/monitor patient progress toward functional and medical goals  Care Tool:  Bathing    Body parts bathed by patient: Right arm, Left arm, Chest, Abdomen, Face   Body parts bathed by helper: Right lower leg, Left lower leg Body parts n/a: Left lower leg, Right lower leg, Left upper leg, Right upper leg, Buttocks, Front perineal area   Bathing assist Assist Level: Minimal Assistance - Patient > 75%     Upper Body Dressing/Undressing Upper body dressing   What is the patient wearing?: Pull over shirt    Upper body assist Assist Level: Set up assist    Lower Body Dressing/Undressing Lower body dressing      What is the patient wearing?: Pants, Incontinence brief     Lower body assist Assist for lower body dressing: Maximal Assistance - Patient 25 - 49%     Toileting Toileting Toileting Activity did  not occur Press photographer and hygiene only): N/A (no void or bm)  Toileting assist Assist for toileting: Maximal Assistance - Patient 25 - 49%     Transfers Chair/bed transfer  Transfers assist     Chair/bed transfer assist level: Minimal Assistance - Patient > 75% (squat pivot) Chair/bed transfer assistive device: Sliding board   Locomotion Ambulation   Ambulation assist   Ambulation activity did not occur: Safety/medical concerns          Walk 10 feet activity   Assist  Walk 10 feet activity did not occur: Safety/medical concerns        Walk 50 feet activity   Assist Walk 50 feet with  2 turns activity did not occur: Safety/medical concerns         Walk 150 feet activity   Assist Walk 150 feet activity did not occur: Safety/medical concerns         Walk 10 feet on uneven surface  activity   Assist Walk 10 feet on uneven surfaces activity did not occur: Safety/medical concerns         Wheelchair     Assist Is the patient using a wheelchair?: Yes Type of Wheelchair: Manual    Wheelchair assist level: Supervision/Verbal cueing Max wheelchair distance: 60'    Wheelchair 50 feet with 2 turns activity    Assist        Assist Level: Supervision/Verbal cueing   Wheelchair 150 feet activity     Assist      Assist Level: Maximal Assistance - Patient 25 - 49% (per PT documentation)   Blood pressure 120/76, pulse (!) 54, temperature 98.1 F (36.7 C), temperature source Oral, resp. rate 18, height 5\' 11"  (1.803 m), weight 71.5 kg, SpO2 98 %. Medical Problem List and Plan: 1.   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  Antibiotic course completed..  Follow-up with ID as outpatient with immunologic work-up and viral serology  Continue CIR 2.  Antithrombotics: -DVT/anticoagulation:  Pharmaceutical: Lovenox.  Check vascular study  10/11- vascular study (-)  10/12- will need Lovenox for 3 months total due to risk of DVT being so high.              -antiplatelet therapy: N/A 3. Pain Management: Tylenol/Advil as needed  Lidoderm patches- 3 of them 8pm to 8am- if not enough, might try tylenol #3.   Relatively controlled on 10/15 4. Mood: Provide emotional support             -antipsychotic agents: N/A 5. Neuropsych: This patient is capable of making decisions on his own behalf. 6. Skin/Wound Care: Routine skin checks             -reiterated to pt that he needs to wear PRAFO's for heel protection and to stretch heel cords  Discussed with nursing size/fitting 7. Fluids/Electrolytes/Nutrition: Routine in and  outs 8.  Seizure prophylaxis.  Keppra 500 mg twice daily 9.  Sinus bradycardia.  Echocardiogram unremarkable.  No further work-up indicated per cardiology services 10.  Neurogenic bladder/Urinary retention.  Failed voiding trial.               cath if needed- if volumes >300cc  Flomax increased on 10/15, monitor blood pressures 11. Neurogenic bowel: begin scheduled senokot-s at HS.             -miralax daily             -consider bowel program depending upon continence and regularity             -  pt reports no sense of bowel emptying currently.  Improving 12. At level SCI pain  10/10- around ribs- feels so tight- it's due to at level SCI pain- starting Lidoderm.   Controlled on 10/15 13.  Mild hyponatremia  Sodium 134 on 10/10, continue to monitor 14.  Elevated BUN  Encourage fluids, labs ordered for Monday   LOS: 7 days A FACE TO FACE EVALUATION WAS PERFORMED  Clarisse Rodriges Karis Juba 04/13/2021, 11:14 AM

## 2021-04-14 MED ORDER — CEPHALEXIN 250 MG PO CAPS: 500.0000 mg | ORAL_CAPSULE | Freq: Four times a day (QID) | ORAL | Status: DC

## 2021-04-14 NOTE — Progress Notes (Deleted)
Brief note:  Patient's family with concerns of increased confusion today.  Discussed with nusring, who report significantly less sleep then previous night.   Workup ordered and reviewed. UA reviewed, Uculture pending.  Empiric Keflex ordered

## 2021-04-14 NOTE — Progress Notes (Incomplete)
Bowel program continue with lage soft mushy stool, and continue digital stimulation  , positive flatus

## 2021-04-14 NOTE — Progress Notes (Signed)
Bowel program initiated at 1836 hours with 30 seconds of dig Stim and bisacodyl suppository inserted with no bowel movement expressed with initial dig Stim will notify oncoming nurse of incomplete bowel program

## 2021-04-14 NOTE — Progress Notes (Signed)
Occupational Therapy Session Note  Patient Details  Name: James Obrien MRN: [id number] Date of Birth: January 05, 1986  Today's Date: 04/15/2021 OT Individual Time: 1300-1330 OT Individual Time Calculation (min): 30 min   Short Term Goals: Week 1:  OT Short Term Goal 1 (Week 1): patient will complete lower body bathing dressing at bed level with min A using assistive devices OT Short Term Goal 1 - Progress (Week 1): Progressing toward goal OT Short Term Goal 2 (Week 1): patient will complete upper body bathing and dressing w/c level with set up OT Short Term Goal 2 - Progress (Week 1): Met OT Short Term Goal 3 (Week 1): patient will sit unsupported for 10+ minutes with min A OT Short Term Goal 3 - Progress (Week 1): Met OT Short Term Goal 4 (Week 1): patient will complete SB transfers with mod A of one OT Short Term Goal 4 - Progress (Week 1): Met  Skilled Therapeutic Interventions/Progress Updates:    Pt greeted in the w/c with no c/o pain, ready to go. He self propelled to the central gym. Worked on LE strengthening in prep for functional transfers at sit<stand level. Used [employer], supervision sit<stand. Worked on mindful knee control using the Stedy knee block as a gauge, also completed Lt>Rt weight shifting and also neutral LE weightbearing as he favors the strong Lt leg. Pt returned to the room at end of session, all needs within reach.   Therapy Documentation Precautions:  Precautions Precautions: Fall Precaution Comments: paraparesis; check BP Other Brace: TED hose Restrictions Weight Bearing Restrictions: No Vital Signs: Therapy Vitals Temp: 97.6 F (36.4 C) Temp Source: Oral Pulse Rate: 67 Resp: 18 BP: 115/69 Patient Position (if appropriate): Lying Oxygen Therapy SpO2: 99 % O2 Device: Room Air ADL: ADL Eating: Set up Where Assessed-Eating: Bed level Grooming: Setup Where Assessed-Grooming: Edge of bed Upper Body Bathing: Setup Where Assessed-Upper Body  Bathing: Edge of bed Lower Body Bathing: Maximal assistance Where Assessed-Lower Body Bathing: Bed level Upper Body Dressing: Minimal assistance Where Assessed-Upper Body Dressing: Edge of bed Lower Body Dressing: Maximal assistance Where Assessed-Lower Body Dressing: Bed level Toileting: Not assessed Where Assessed-Toileting: Bed level Toilet Transfer: Not assessed ADL Comments: requires max A to maintain sitting edge of bed for grooming and upper body adl  Therapy/Group: Individual Therapy  Diyari Cherne A Kayveon Lennartz 04/15/2021, 4:07 PM

## 2021-04-14 NOTE — Progress Notes (Deleted)
Bowel program initiated at 1836 pm with 30 seconds of dig Stim performed with bisacodyl suppository inserted at 1836 hours No bowel movement expressed with beginning of bowel movement will notify oncoming nurse of incomplete bowel movement

## 2021-04-14 NOTE — Progress Notes (Signed)
Physical Therapy Session Note  Patient Details  Name: James Obrien MRN: [id number] Date of Birth: 1986/05/11  Today's Date: 04/13/2021 PT Individual Time: 1106-1201  PT Individual Time Calculation (min): 55 min    Short Term Goals: Week 1:  PT Short Term Goal 1 (Week 1): Pt will perform bed mobility with modA +1. PT Short Term Goal 2 (Week 1): Pt will perform bed to chair transfer with modA +1. PT Short Term Goal 3 (Week 1): Pt will maintain static sitting balance for 5 minutes with close supervision.   Skilled Therapeutic Interventions/Progress Updates:  Patient sidelying in bed on entrance to room. Wife present. Patient alert and agreeable to PT session.   Patient with no pain complaint throughout session.  During session, hamstrings noted to be tight and gentle PROM stretching held for x 3 sets to RLE and x1 set to LLE. Educated pt re: use of pillows in bed for brief periods on// off using pillow float to keep heels elevated while also  either removing pillow from behind knees or flattening out lower end of bed in order to provide stretch to hamstrings passively. Will progress as tightness progresses.   Therapeutic Activity: Bed Mobility: Patient performed supine --> sit with supervision using log roll technique and pushing up to sit from sidelying. No vc required. Transfers: Patient performed squat pivot transfer from bed to w/c with light CGA. Transferred with low clearance height. Able to position BLE correctly prior to transfer.  Wheelchair Mobility:  Patient propelled wheelchair about room in tight spaces and is able to maneuver and self correct with ease and minimal cueing for corrections. Propels across threshold into/ out of elevator and entire distance from room to therapy gym with supervision with only cueing for directionality.   Neuromuscular Re-ed: NMR facilitated during session with focus on muscle activation, proprioception, joint approximation. Pt  guided in standing balance/ tolerance using parallel bars with Airex pad used for knee block. Guided pt in minisquats with BLE and hip assist. Max A provided to RLE and Mod A to LLE. Next standing bout with focus on quad and glute activation. Full guard provided to RLE and focus to LLE activation to maintain extension and upright posture. Pt guided in lift of L hand and touch to opposite shoulder. Pt initially with reduced confidence and hesitant. Cues provided for LLE activation prior to lift of R hand and touch to L shoulder. Guided to continue L , then R. Increasing confidence throughout.  NMR performed for improvements in motor control and coordination, balance, sequencing, judgement, and self confidence/ efficacy in performing all aspects of mobility at highest level of independence.   Patient seated  in w/c at end of session with brakes locked, no alarm set with wife in room, NT aware, pt setting self up with lunch tray, and all needs within reach.     Therapy Documentation Precautions:  Precautions Precautions: Fall Precaution Comments: paraparesis; check BP Other Brace: TED hose Restrictions Weight Bearing Restrictions: No General:   Pain:  No complaint of pain this session.   Therapy/Group: Individual Therapy  Loel Dubonnet PT, DPT 04/14/2021, 6:51 AM

## 2021-04-14 NOTE — Progress Notes (Signed)
Physical Therapy Session Note  Patient Details  Name: James Obrien MRN: [id number] Date of Birth: 05/23/1986  Today's Date: 04/14/2021 PT Individual Time: 0920-1012 PT Individual Time Calculation (min): 52 min   Short Term Goals: Week 1:  PT Short Term Goal 1 (Week 1): Pt will perform bed mobility with modA +1. PT Short Term Goal 2 (Week 1): Pt will perform bed to chair transfer with modA +1. PT Short Term Goal 3 (Week 1): Pt will maintain static sitting balance for 5 minutes with close supervision. Week 2:     Skilled Therapeutic Interventions/Progress Updates:   Pt resting in bed.  [name], James Obrien had just finished cathing pt, which caused late start to session.  Supine> sit EOB with supervision.  Squat pivot transfer to R with close supervision; pt somehwat impulsive, landing on front of seat but quickly moving back without cues.  Pt able to place bil footrests on wc when set up on bed, but needed cues throughout session, to lock brakes before reaching to floor or laterally out of BOS for something.  Wc propulsion to/from 5C to 22M, using elevator, supervision. Brake on L wheel is extremely difficult to lock, and needs adjustment.   Use of UBE at level 6.0 x 5 minutes, backwards.  Use of Kinetron for bil LE ROM and strengthening, at resistance 60 cm/sec focusing on LLE with mod assist to push footplate to floor, at resistance 90 cm/sec focusing on RLE (pt's request) with total assist to push footplate to floor, x 10 cycles x 2 each.  Feet placed on foot plates to offload heels.  Neuromuscular re-education via demo and multimodal cues for isolated R knee extension/flexion, seated in wc with wash cloth under foot; hamstrings active repeatedly for flexion.   At end of session, pt seated in wc with wife present.  Discussed falls risk and pt and wife stated that he would call for assistance if he wanted to get back into bed.     Therapy Documentation Precautions:  Precautions Precautions:  Fall Precaution Comments: paraparesis; check BP Other Brace: TED hose Restrictions Weight Bearing Restrictions: No General: PT Amount of Missed Time (min): 8 Minutes PT Missed Treatment Reason: Nursing care      Therapy/Group: Individual Therapy  James Obrien 04/14/2021, 10:22 AM

## 2021-04-15 LAB — BASIC METABOLIC PANEL
Anion gap: 7 (ref 5–15)
BUN: 15 mg/dL (ref 6–20)
CO2: 30 mmol/L (ref 22–32)
Calcium: 9.3 mg/dL (ref 8.9–10.3)
Chloride: 102 mmol/L (ref 98–111)
Creatinine, Ser: 0.96 mg/dL (ref 0.61–1.24)
GFR, Estimated: >60 mL/min (ref >60)
Glucose, Bld: 96 mg/dL (ref 70–99)
Potassium: 3.8 mmol/L (ref 3.5–5.1)
Sodium: 139 mmol/L (ref 135–145)

## 2021-04-15 NOTE — Progress Notes (Signed)
Physical Therapy Weekly Progress Note  Patient Details  Name: James Obrien MRN: [id number] Date of Birth: 29-Mar-1986  Beginning of progress report period: April 07, 2021 End of progress report period: April 15, 2021  Today's Date: 04/15/2021 PT Individual Time: 0800-0830, 1400 PT Individual Time Calculation (min): 30 min   Patient has met 3 of 3 short term goals.  Pt performs bed mobility with supervision to min A at times for LE management. Squat pivot transfers with close supervision and minimal cueing. Supervision w/c mobility, including leg rests and brakes, mod A wheelies.  Patient continues to demonstrate the following deficits muscle weakness and muscle paralysis, decreased cardiorespiratoy endurance, abnormal tone, unbalanced muscle activation, and decreased coordination, and decreased standing balance and decreased balance strategies and therefore will continue to benefit from skilled PT intervention to increase functional independence with mobility.  Patient progressing toward long term goals..  Plan of care revisions: Upgraded d/t progress.  PT Short Term Goals Week 1:  PT Short Term Goal 1 (Week 1): Pt will perform bed mobility with modA +1. PT Short Term Goal 1 - Progress (Week 1): Met PT Short Term Goal 2 (Week 1): Pt will perform bed to chair transfer with modA +1. PT Short Term Goal 2 - Progress (Week 1): Met PT Short Term Goal 3 (Week 1): Pt will maintain static sitting balance for 5 minutes with close supervision. PT Short Term Goal 3 - Progress (Week 1): Met Week 2:  PT Short Term Goal 1 (Week 2): Pt will stand with mod A to RW PT Short Term Goal 2 (Week 2): Pt will initiate gait training PT Short Term Goal 3 (Week 2): Pt will perform dynamic reaching tasks outside BOS without LOB x 5 min  Skilled Therapeutic Interventions/Progress Updates:  Session 1: pt received in bed and agreeable to therapy. No complaint of pain. Supine>sit with supervision. Nsg in/out  for meds pass. Squat pivot to w/c with supervision, min cues for positioning. Pt propelled w/c with BUE x 300 ft including elevator to therapy gym. Pt then directed in standing in // bars with CGA to stand, mod A for full hip extension, airex to block knees. Pt performed x10 mini squats with assist to control eccentric motion. Pt then directed in weight shifting. Noted that when shifting to R side, demoes pelvic drop and no hip or knee control at this time. Pt returned to room and remained sitting in w/c, was left with all needs in reach and alarm active.   Session 2: Pt seated in w/c on arrival and agreeable to therapy. No complaint of pain. Lab in/out for blood draw at start of session. Pt propelled w/c with BUE throughout session, up to 300+ ft. Pt directed in standing in // bars with CGA to stand with knee block and mod A for full hip extension. Alternating bouts of weight shifting and mini squats, with emphasis on controlling motion and incr hip activation. Transitioned to car simulator,  pt performed car transfer x 2 with CGA using squat pivot technique and overhead handle to perform transfer. Returned to main gym to attempt to tighten L w/c brake. Pt returned to room and performed squat pivot to bed with supervision, sit>supine with supervision. Therapist positioned RLE on pillow to float heel and pt was left with all needs in reach and alarm active.   Ambulation/gait training;Community reintegration;DME/adaptive equipment instruction;Neuromuscular re-education;Psychosocial support;Stair training;UE/LE Strength taining/ROM;Wheelchair propulsion/positioning;Balance/vestibular training;Discharge planning;Functional electrical stimulation;Pain management;Skin care/wound management;UE/LE Coordination activities;Therapeutic Activities;Cognitive remediation/compensation;Disease management/prevention;Functional mobility  training;Patient/family education;Splinting/orthotics;Therapeutic Exercise   Therapy  Documentation Precautions:  Precautions Precautions: Fall Precaution Comments: paraparesis; check BP Other Brace: TED hose Restrictions Weight Bearing Restrictions: No    Therapy/Group: Individual Therapy  Mickel Fuchs 04/15/2021, 9:20 AM

## 2021-04-15 NOTE — Progress Notes (Signed)
Occupational Therapy Session Note  Patient Details  Name: James Obrien MRN: [id number] Date of Birth: Dec 16, 1985  Today's Date: 04/15/2021 OT Individual Time: 830-858 (1st session) and 1000-1053 (2nd session) 28 mins and 53 mins    Short Term Goals: Week 1:  OT Short Term Goal 1 (Week 1): patient will complete lower body bathing dressing at bed level with min A using assistive devices OT Short Term Goal 2 (Week 1): patient will complete upper body bathing and dressing w/c level with set up OT Short Term Goal 3 (Week 1): patient will sit unsupported for 10+ minutes with min A OT Short Term Goal 4 (Week 1): patient will complete SB transfers with mod A of one   Skilled Therapeutic Interventions/Progress Updates:    Session 1: Pt greeted at time of session rolling back into room with PT, no c/o pain. Self propel room <> therapy gym on 5th floor with Supervision and squat pivot w/c <> Nustep with CGA with cues for foot placement and anterior weight shift. Using gait belt at knees to prevent external rotation, pt performed Nustep on level 4 for 10 mins. Also briefly attempted LE's only with no UB assist and needed Max A to facilitate. Squat pivot/scoot back to w/c and set up in room alarm on call bell in reach.    Session 2: Pt greeted at time of session up in chair with wife present. No pain throughout session. Pt hesitant regarding shower, focused initially on squat pivot/scoot w/c <> bariatric DABSC in shower with CGA and cues for hand placement and problem solving for future sessions. Self propel room <> 4th floor for TTB transfer in more realistic set up, again with CGA and able to manage BLE's over tub wall with UE assist. Extensive discussion with pt regarding home set up at mother's home for bathroom, doorways, etc. Provided home measurement sheet for pt to give to parents to measure their home for doorways. Remainder of session, spent with BUE strength/core strengthening on EOM with  weigthed 2kg ball for woodchoppers and chest press 2x20 each direction, 2x10 sit ups on wegde with overhead raise, note needing Mod A for last few reps 2/2 fatigue. Squat pivot same as above mat <> w/c, back up in room alarm on call bell in reach.    Therapy Documentation Precautions:  Precautions Precautions: Fall Precaution Comments: paraparesis; check BP Other Brace: TED hose Restrictions Weight Bearing Restrictions: No    Therapy/Group: Individual Therapy  Erasmo Score 04/15/2021, 7:16 AM

## 2021-04-15 NOTE — Progress Notes (Signed)
Occupational Therapy Weekly Progress Note  Patient Details  Name: James Obrien MRN: [id number] Date of Birth: 1985-07-26  Beginning of progress report period: April 07, 2021 End of progress report period: April 15, 2021    Patient has met 3 of 4 short term goals.  Pt has made good progress toward OT goals, now performing squat pivot and scoot pivot transfers to various surfaces for ADL tasks with CGA with occasional assist for feet placement and cues for hand placement in new situations. Pt's sitting balance has significantly improved to sit unsupported for several minutes while completing dynamic tasks. Pt performing LB bathe/dress bed level with Min/mod, UB ADLs with set up, and toileting with Mod A for clothing management. Pt continues to have profound weakness and AROM in BLEs that has preventing standing during ADLs. Wife has also been present for several sessions for observation and input.   Patient continues to demonstrate the following deficits: muscle weakness, decreased cardiorespiratoy endurance, impaired timing and sequencing, abnormal tone, decreased coordination, and decreased motor planning, and decreased sitting balance, decreased standing balance, decreased postural control, and decreased balance strategies and therefore will continue to benefit from skilled OT intervention to enhance overall performance with BADL, iADL, and Reduce care partner burden.  Patient progressing toward long term goals..  Continue plan of care.  OT Short Term Goals Week 1:  OT Short Term Goal 1 (Week 1): patient will complete lower body bathing dressing at bed level with min A using assistive devices OT Short Term Goal 1 - Progress (Week 1): Progressing toward goal OT Short Term Goal 2 (Week 1): patient will complete upper body bathing and dressing w/c level with set up OT Short Term Goal 2 - Progress (Week 1): Met OT Short Term Goal 3 (Week 1): patient will sit unsupported for 10+ minutes  with min A OT Short Term Goal 3 - Progress (Week 1): Met OT Short Term Goal 4 (Week 1): patient will complete SB transfers with mod A of one OT Short Term Goal 4 - Progress (Week 1): Met Week 2:  OT Short Term Goal 1 (Week 2): Pt will complete bathing shower level MIN A with AE PRN OT Short Term Goal 2 (Week 2): Pt will perform BSC/toilet transfers CGA with no more than occasional cues for form OT Short Term Goal 3 (Week 2): Pt will perform LB dressing bed level Min A and adapative equipement PRN OT Short Term Goal 4 (Week 2): Pt will consistently perform squat pivot transfers CGA to various surfaces in prep for ADL    Therapy Documentation Precautions:  Precautions Precautions: Fall Precaution Comments: paraparesis; check BP Other Brace: TED hose Restrictions Weight Bearing Restrictions: No    Therapy/Group: Individual Therapy  [name] 04/15/2021, 7:17 AM

## 2021-04-15 NOTE — Progress Notes (Signed)
PROGRESS NOTE   Subjective/Complaints:  Pt reports has plenty of BM's-  With bowel program and 1x Saturday AM - on accident.   Also sweating a lot- and only has spasms with cathing. No other signs of spasticity-   Explained that sweating above level of lesion is common in SCI patients.   ROS:  Pt denies SOB, abd pain, CP, N/V/C/D, and vision changes   Objective:   No results found. No results for input(s): WBC, HGB, HCT, PLT in the last 72 hours.  Recent Labs    04/13/21 0336  CREATININE 0.92     Intake/Output Summary (Last 24 hours) at 04/15/2021 1433 Last data filed at 04/15/2021 7846 Gross per 24 hour  Intake 2605 ml  Output 1650 ml  Net 955 ml     Pressure Injury 04/12/21 Heel Right Deep Tissue Pressure Injury - Purple or maroon localized area of discolored intact skin or blood-filled blister due to damage of underlying soft tissue from pressure and/or shear. Konrad Dolores RN  verification (Active)  04/12/21 1045  Location: Heel  Location Orientation: Right  Staging: Deep Tissue Pressure Injury - Purple or maroon localized area of discolored intact skin or blood-filled blister due to damage of underlying soft tissue from pressure and/or shear.  Wound Description (Comments): Konrad Dolores RN  verification  Present on Admission: Yes    Physical Exam: Vital Signs Blood pressure 122/78, pulse 66, temperature 98.2 F (36.8 C), temperature source Oral, resp. rate 16, height 5\' 11"  (1.803 m), weight 71.5 kg, SpO2 98 %.    General: awake, alert, appropriate, sitting up in bed; but initially asleep; wife at bedside; NAD HENT: conjugate gaze; oropharynx moist CV: regular rate; no JVD Pulmonary: CTA B/L; no W/R/R- good air movement GI: soft, NT, ND, (+)BS Psychiatric: appropriate; sleepy;  Neurological: Ox3  Skin: Warm and dry.  Large DTI right heel. Psych: Normal mood.  Normal behavior. Musc: No edema  in extremities.  No tenderness in extremities. Neurological: Alert and oriented Decreased LT/pain sense aroung T5-6 with legs more affected than trunk, unchanged.  Motor: LLE 2/5 HE, KE, 1/5 ADF.  RLE abduction, knee extension 1+/5, ankle dorsiflexion 0/5   Assessment/Plan: 1. Functional deficits which require 3+ hours per day of interdisciplinary therapy in a comprehensive inpatient rehab setting. Physiatrist is providing close team supervision and 24 hour management of active medical problems listed below. Physiatrist and rehab team continue to assess barriers to discharge/monitor patient progress toward functional and medical goals  Care Tool:  Bathing    Body parts bathed by patient: Right arm, Left arm, Chest, Abdomen, Face   Body parts bathed by helper: Right lower leg, Left lower leg Body parts n/a: Left lower leg, Right lower leg, Left upper leg, Right upper leg, Buttocks, Front perineal area   Bathing assist Assist Level: Minimal Assistance - Patient > 75%     Upper Body Dressing/Undressing Upper body dressing   What is the patient wearing?: Pull over shirt    Upper body assist Assist Level: Set up assist    Lower Body Dressing/Undressing Lower body dressing      What is the patient wearing?: Pants, Incontinence brief  Lower body assist Assist for lower body dressing: Maximal Assistance - Patient 25 - 49%     Toileting Toileting Toileting Activity did not occur (Clothing management and hygiene only): N/A (no void or bm)  Toileting assist Assist for toileting: Maximal Assistance - Patient 25 - 49%     Transfers Chair/bed transfer  Transfers assist     Chair/bed transfer assist level: Supervision/Verbal cueing Chair/bed transfer assistive device: Armrests   Locomotion Ambulation   Ambulation assist   Ambulation activity did not occur: Safety/medical concerns          Walk 10 feet activity   Assist  Walk 10 feet activity did not occur:  Safety/medical concerns        Walk 50 feet activity   Assist Walk 50 feet with 2 turns activity did not occur: Safety/medical concerns         Walk 150 feet activity   Assist Walk 150 feet activity did not occur: Safety/medical concerns         Walk 10 feet on uneven surface  activity   Assist Walk 10 feet on uneven surfaces activity did not occur: Safety/medical concerns         Wheelchair     Assist Is the patient using a wheelchair?: Yes Type of Wheelchair: Manual    Wheelchair assist level: Supervision/Verbal cueing Max wheelchair distance: 300    Wheelchair 50 feet with 2 turns activity    Assist        Assist Level: Supervision/Verbal cueing   Wheelchair 150 feet activity     Assist      Assist Level: Supervision/Verbal cueing   Blood pressure 122/78, pulse 66, temperature 98.2 F (36.8 C), temperature source Oral, resp. rate 16, height 5\' 11"  (1.803 m), weight 71.5 kg, SpO2 98 %. Medical Problem List and Plan: 1.   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  Antibiotic course completed..  Follow-up with ID as outpatient with immunologic work-up and viral serology  Con't CIR/PT and OT- spoke with SW- they never received the disability paperwork- asked SW to address.  2.  Antithrombotics: -DVT/anticoagulation:  Pharmaceutical: Lovenox.  Check vascular study  10/11- vascular study (-)  10/12- will need Lovenox for 3 months total due to risk of DVT being so high.              -antiplatelet therapy: N/A 3. Pain Management: Tylenol/Advil as needed  Lidoderm patches- 3 of them 8pm to 8am- if not enough, might try tylenol #3.   Relatively controlled on 10/15 4. Mood: Provide emotional support             -antipsychotic agents: N/A 5. Neuropsych: This patient is capable of making decisions on his own behalf. 6. Skin/Wound Care: Routine skin checks             -reiterated to pt that he needs to wear PRAFO's  for heel protection and to stretch heel cords  Discussed with nursing size/fitting  10/17- con't PRAFOs at night- con't regimen- but need to make sure fitting correctly 7. Fluids/Electrolytes/Nutrition: Routine in and outs 8.  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  Keppra 500 mg twice daily 9.  Sinus bradycardia.  Echocardiogram unremarkable.  No further work-up indicated per cardiology services 10.  Neurogenic bladder/Urinary retention.  Failed voiding trial.               cath if needed- if volumes >300cc  Flomax increased on 10/15, monitor blood pressures  10/17- still not voiding- con't caths and flomax increased this weekend 11. Neurogenic bowel: begin scheduled senokot-s at HS.             -miralax daily             -consider bowel program depending upon continence and regularity             -pt reports no sense of bowel emptying currently.  10/17- pt having results with/after bowel program- con't regimen 12. At level SCI pain  10/10- around ribs- feels so tight- it's due to at level SCI pain- starting Lidoderm.   10/17 con't regimen- controlled 13.  Mild hyponatremia  Sodium 134 on 10/10, continue to monitor 14.  Elevated BUN  Encourage fluids, labs ordered for Monday  10/17- BN 27- Cr 1.00- will con't to push fluids-    LOS: 9 days A FACE TO FACE EVALUATION WAS PERFORMED  Areyana Leoni 04/15/2021, 2:33 PM

## 2021-04-16 NOTE — Patient Care Conference (Signed)
Inpatient RehabilitationTeam Conference and Plan of Care Update Date: 04/16/2021   Time: 11:39 AM    Patient Name: James Obrien      Medical Record Number: [id number]  Date of Birth: December 31, 1985 Sex: Male         Room/Bed: 5C13C/5C13C-01 Payor Info: Payor: [employer] / Plan: Research scientist (life sciences) / Product Type: *No Product type* /    Admit Date/Time:  04/06/2021  3:55 PM  Primary Diagnosis:  Myelitis [facility])  Hospital Problems: Principal Problem:   Myelitis (HCC) Active Problems:   Encephalitis   Elevated BUN   Hyponatremia   Neurogenic bowel   Neurogenic bladder    Expected Discharge Date: Expected Discharge Date: 04/25/21  Team Members Present: Physician leading conference: Dr. Genice Rouge Social Worker Present: [name], BSW Nurse Present: Kennyth Arnold, RN PT Present: Bernie Covey, PT OT Present: Kearney Hard, OT PPS Coordinator present : Fae Pippin, SLP     Current Status/Progress Goal Weekly Team Focus  Bowel/Bladder   I&O cath Q6hr. Bowel program. LBM 10/17  To be able to have control of bladder and bowels.  Continue I&O cath and bowel program   Swallow/Nutrition/ Hydration             ADL's   Mod/Max simulated on toilet, difficulty voiding, Min LB bathe, Set up UB, squat pivot CGA/Supervision  Min LB, Min transfers (will upgrade Mod I/Supervision)  sitting balance/core strength, sit <> stands in stedy/at RW if able, ADL retraining, AE training, NMR, DC planning   Mobility   supervision squat pivot, supervision w/c mobility  upgraded to mod I  transfers, moniltiy   Communication             Safety/Cognition/ Behavioral Observations            Pain   Some pain, refuses lidocaine pathces.  Controled pain  Assess Qshift and PRN   Skin   Healed penile lesions, Remainder of skin intact.  skin to remain intact  Assess Qshift and PRN     Discharge Planning:  Pt discharging home with his mother. Mother, spouse, children and friends able to  assist at home. SW waiting for DME and follow up reccommendations from therapy team. [employer] (Potential barrier for [facility] follow up). SW still waiting on pt disability paperwork.   Team Discussion: Has right heel DTI, blister above. Not voiding, requires I&O caths. Will go home on Lovenox. Continent bowel, LBM 10/16. Waiting on discharge DME recommendations.  Patient on target to meet rehab goals: yes, doing well with squat pivot transfers. Contact guard/min assist squat pivot. Min assist lower body dressing. Will upgrade goals to Mod I. Supervision W/C. Working on W/C eval.   *See Care Plan and progress notes for long and short-term goals.   Revisions to Treatment Plan:  Adjusting medications, Lovenox post discharge x3 months.  Teaching Needs: Family education, medication management, pain management, skin/wound care, I&O cath education, bowel program education, safety awareness.  Current Barriers to Discharge: Decreased caregiver support, Home enviroment access/layout, Neurogenic bowel and bladder, Wound care, Lack of/limited family support, Weight, Medication compliance, and Behavior  Possible Resolutions to Barriers: Family education, set-up W/C eval, working on disability for patient.     Medical Summary Current Status: will need lovenox x 3 months total- has a bowel program; cathing for bladder; no voiding- on flomax; needs education on all these issues; DTI on R heel; and blood blsiter R achilles tendon  Barriers to Discharge: Weight bearing restrictions;Wound care;Decreased family/caregiver support;Home enviroment  access/layout;Neurogenic Bowel & Bladder;Medical stability;Behavior;Weight  Barriers to Discharge Comments: working on disability for pt. Possible Resolutions to [employer]: stubborn per pt; wife here all the time; local wound care R heel- wearing PRAFO's; needs ultralight w/c eval for SCI; doing standing/for transfers; d/c 10/27   Continued Need for Acute  Rehabilitation Level of Care: The patient requires daily medical management by a physician with specialized training in physical medicine and rehabilitation for the following reasons: Direction of a multidisciplinary physical rehabilitation program to maximize functional independence : Yes Medical management of patient stability for increased activity during participation in an intensive rehabilitation regime.: Yes Analysis of laboratory values and/or radiology reports with any subsequent need for medication adjustment and/or medical intervention. : Yes   I attest that I was present, lead the team conference, and concur with the assessment and plan of the team.   Tennis Must 04/16/2021, 4:27 PM

## 2021-04-16 NOTE — Progress Notes (Signed)
Physical Therapy Session Note  Patient Details  Name: James Obrien MRN: [id number] Date of Birth: 1986/06/08  Today's Date: 04/16/2021 PT Individual Time: 0930-1030 PT Individual Time Calculation (min): 60 min   Short Term Goals: Week 1:  PT Short Term Goal 1 (Week 1): Pt will perform bed mobility with modA +1. PT Short Term Goal 1 - Progress (Week 1): Met PT Short Term Goal 2 (Week 1): Pt will perform bed to chair transfer with modA +1. PT Short Term Goal 2 - Progress (Week 1): Met PT Short Term Goal 3 (Week 1): Pt will maintain static sitting balance for 5 minutes with close supervision. PT Short Term Goal 3 - Progress (Week 1): Met  Skilled Therapeutic Interventions/Progress Updates:    Pt seated in w/c on arrival and agreeable to therapy. No complaint of pain. Pt propelled w/c with BUE with supervision to therapy gym, including elevator and leg rests/brakes. Noted depressed affect, pt expressed that he is concerned about finances, prognosis, family dynamics, caring for his family. Pt declined consultation with neuropsych. Reached out to recreational therapist about concerns. Sit to stand x 5 in // bars with CGA to stand, min A to facilitate hip extension and maintain with fatigue. During each stand, pt performed BIL weight shift x 2, able to remove ipsilateral had from bars. Pt able to maintain lateral hip stability with min-mod A on L side, max A to R side.  Transitioned to problem solving transfer to tub transfer bench. D/t pt's home set up he will likely need to transfer with chair facing TTB, doing 180 deg squat pivot. Pt performed this transfer to mat table with CGA x 2. Pt then performed transfer to TTB in ADL apartment with CGA and same technique. Pt and therapist feel confident in this approach for highest level of independence at home. Pt returned to room and remained seated in w/c, was left with all needs in reach and alarm active.   Therapy Documentation Precautions:   Precautions Precautions: Fall Precaution Comments: paraparesis; check BP Other Brace: TED hose Restrictions Weight Bearing Restrictions: No     Therapy/Group: Individual Therapy  James Obrien 04/16/2021, 12:45 PM

## 2021-04-16 NOTE — Progress Notes (Signed)
Occupational Therapy Session Note  Patient Details  Name: James Obrien MRN: [id number] Date of Birth: September 18, 1985  Today's Date: 04/16/2021 OT Individual Time: 9390-3009 and 2330-0762 OT Individual Time Calculation (min): 28 min and and 56 min   Short Term Goals: Week 1:  OT Short Term Goal 1 (Week 1): patient will complete lower body bathing dressing at bed level with min A using assistive devices OT Short Term Goal 1 - Progress (Week 1): Progressing toward goal OT Short Term Goal 2 (Week 1): patient will complete upper body bathing and dressing w/c level with set up OT Short Term Goal 2 - Progress (Week 1): Met OT Short Term Goal 3 (Week 1): patient will sit unsupported for 10+ minutes with min A OT Short Term Goal 3 - Progress (Week 1): Met OT Short Term Goal 4 (Week 1): patient will complete SB transfers with mod A of one OT Short Term Goal 4 - Progress (Week 1): Met Week 2:  OT Short Term Goal 1 (Week 2): Pt will complete bathing shower level MIN A with AE PRN OT Short Term Goal 2 (Week 2): Pt will perform BSC/toilet transfers CGA with no more than occasional cues for form OT Short Term Goal 3 (Week 2): Pt will perform LB dressing bed level Min A and adapative equipement PRN OT Short Term Goal 4 (Week 2): Pt will consistently perform squat pivot transfers CGA to various surfaces in prep for ADL   Skilled Therapeutic Interventions/Progress Updates:    Session 1: Pt greeted at time of session semireclined in bed resting, agreeable to OT session. No pain. Discussion throughout session regarding DC planning home to his mom's home, pt showing pictures of bathroom set up with direct entry and tub at end of bathroom. Recommended taking additional measurements and potentially removing leg rests for better proximity in small space when transferring to shower bench in tub. Pt somewhat receptive but also has own ideas for transfers which sound somewhat unsafe, will continue to problem solve.  Supine > sit Supervision and squat pivot bed > wheelchair Supervision/CGA. Self propel throughout room > closet to gather shirt/items needed and oral hygiene at sink with Mod I. Up in chair alarm on call bell in reach.   Session 2: Pt greeted at time of session up in wheelchair with wife present who remained throughout session, pt somewhat emotional after receiving positive news of friends doing a fundraiser for the pt. After discussion, pt agreeable to planned shower. Squat pivot w/c > DABSC in shower with CGA, doffed clothing Min A for brief and pants, performed UB/LB bathing Min A only for thoroughness for feet. Pt using lateral leans after education on importance of doing this for safety for buttocks, slightly impulsive leaning FWD at times. Dried off same manner and UB dress set up, LB dress Min A for pants to assist into figure four to thread and pt with lateral leans over hips. Squat pivot BSC > w/c > bed CGA/Supervision. Alarm on call bell in reach.     Therapy Documentation Precautions:  Precautions Precautions: Fall Precaution Comments: paraparesis; check BP Other Brace: TED hose Restrictions Weight Bearing Restrictions: No    Therapy/Group: Individual Therapy  [name] 04/16/2021, 7:14 AM

## 2021-04-16 NOTE — Progress Notes (Signed)
Patient ID: James Obrien, male   DOB: 05-04-1986, 35 y.o.   MRN: [id number] Team Conference Report to Patient/Family  Team Conference discussion was reviewed with the patient and caregiver, including goals, any changes in plan of care and target discharge date.  Patient and caregiver express understanding and are in agreement.  The patient has a target discharge date of 04/25/21.  Sw met with patient and spouse. Provided conference updates. [employer] paperwork received and provided to pt. No additional questions or concerns.  [name] 04/16/2021, 1:01 PM

## 2021-04-16 NOTE — Progress Notes (Signed)
Physical Therapy Session Note  Patient Details  Name: James Obrien MRN: [id number] Date of Birth: [birth year]  Today's Date: 04/16/2021 PT Individual Time: 1165-7903 PT Individual Time Calculation (min): 53 min   Short Term Goals: Week 1:  PT Short Term Goal 1 (Week 1): Pt will perform bed mobility with modA +1. PT Short Term Goal 1 - Progress (Week 1): Met PT Short Term Goal 2 (Week 1): Pt will perform bed to chair transfer with modA +1. PT Short Term Goal 2 - Progress (Week 1): Met PT Short Term Goal 3 (Week 1): Pt will maintain static sitting balance for 5 minutes with close supervision. PT Short Term Goal 3 - Progress (Week 1): Met Week 2:  PT Short Term Goal 1 (Week 2): Pt will stand with mod A to RW PT Short Term Goal 2 (Week 2): Pt will initiate gait training PT Short Term Goal 3 (Week 2): Pt will perform dynamic reaching tasks outside BOS without LOB x 5 min  Skilled Therapeutic Interventions/Progress Updates:   Received pt semi-reclined in bed with wife present at bedside. Pt agreeable to PT treatment and did not state pain level during session. Session with emphasis on functional mobility/transfers, generalized strengthening, dynamic sitting balance/coordination, and improved activity tolerance. Pt performed bed mobility with supervision and HOB elevated and transferred bed<>WC squat<>pivot with supervision. Pt performed WC mobility from 5C to 4W dayroom (>564f) including getting in/out elevator with supervision and managed WC parts mod I (legrests and armrests). Squat<>pivot to/from Nustep with close supervision/CGA and min cues for postioning of WC. Pt performed BUE/LE strengthening on Nustep at workload 5 for 8 minutes for a total of 252 steps with emphasis on cardiovascular endurance and BLE strengthening. Pt reported weakness in RLE>LLE and relying on arms to assist moving R footplate of Nustep; also reported increased "tingling" in RLE after activity and required rest break  for symptoms to resolve. Performed WC mobility additional >1527fusing BUE and supervision to ortho gym with plans to work on blocked practice sit<>stands at standing frame. However, pt reported standing frame and stedy pads were uncomfortable due to his height and politely declined practicing. Worked on UE strengthening performing WC mobility up/down ramp x 3 trials with close supervision with emphasis on speed, control, and navigating tight spaces. Transitioned to dynamic sitting balance and core strengthening performing overhead chest press<>bilateral oblique twist<>bouncing ball on rebounder x10 reps with 6.5lb medicine ball with emphasis on core strength/control. Propelled WC final [id number] BUE and supervision back to room and requested to remain in WC [facility] preparation for shower with OT. Concluded session with pt sitting in [facility] all needs within reach and wife present at bedside.   Therapy Documentation Precautions:  Precautions Precautions: Fall Precaution Comments: paraparesis; check BP Other Brace: TED hose Restrictions Weight Bearing Restrictions: No  Therapy/Group: Individual Therapy AnnAlfonse Alpers, DPT   04/16/2021, 7:44 AM

## 2021-04-16 NOTE — Progress Notes (Signed)
PROGRESS NOTE   Subjective/Complaints:  Pt reports no particular issues except Skin issues on R heel.   Sleeping well-= asking for SW number about disability paperwork- said emailed 7 days ago.    ROS:   Pt denies SOB, abd pain, CP, N/V/C/D, and vision changes  Objective:   No results found. No results for input(s): WBC, HGB, HCT, PLT in the last 72 hours.  Recent Labs    04/15/21 1405  NA 139  K 3.8  CL 102  CO2 30  GLUCOSE 96  BUN 15  CREATININE 0.96  CALCIUM 9.3     Intake/Output Summary (Last 24 hours) at 04/16/2021 1041 Last data filed at 04/16/2021 0908 Gross per 24 hour  Intake 720 ml  Output 1175 ml  Net -455 ml     Pressure Injury 04/12/21 Heel Right Deep Tissue Pressure Injury - Purple or maroon localized area of discolored intact skin or blood-filled blister due to damage of underlying soft tissue from pressure and/or shear. Konrad Dolores RN  verification (Active)  04/12/21 1045  Location: Heel  Location Orientation: Right  Staging: Deep Tissue Pressure Injury - Purple or maroon localized area of discolored intact skin or blood-filled blister due to damage of underlying soft tissue from pressure and/or shear.  Wound Description (Comments): Konrad Dolores RN  verification  Present on Admission: Yes    Physical Exam: Vital Signs Blood pressure 117/65, pulse (!) 55, temperature 99 F (37.2 C), temperature source Oral, resp. rate 16, height 5\' 11"  (1.803 m), weight 71.5 kg, SpO2 97 %.     General: awake, alert, appropriate, supine in bed; wife at bedside; sleepy initially; NAD HENT: conjugate gaze; oropharynx moist CV: regular rhythm; bradycardic rate; no JVD Pulmonary: CTA B/L; no W/R/R- good air movement GI: soft, NT, ND, (+)BS Psychiatric: appropriate; more interactive Neurological: Ox3  Skin: R heel/back of heel and over edge of bottom of heel DTI-  ~ 1.5-2 inches in diameter- no  open- brown/purple color Also has a blood blister- that looks like seeping slightly- serous drainage on achilles tendon on R Musc: No edema in extremities.  No tenderness in extremities. Neurological: Alert and oriented Decreased LT/pain sense aroung T5-6 with legs more affected than trunk, unchanged.  Motor: LLE 2/5 HE, KE, 1/5 ADF.  RLE abduction, knee extension 1+/5, ankle dorsiflexion 0/5   Assessment/Plan: 1. Functional deficits which require 3+ hours per day of interdisciplinary therapy in a comprehensive inpatient rehab setting. Physiatrist is providing close team supervision and 24 hour management of active medical problems listed below. Physiatrist and rehab team continue to assess barriers to discharge/monitor patient progress toward functional and medical goals  Care Tool:  Bathing    Body parts bathed by patient: Right arm, Left arm, Chest, Abdomen, Face   Body parts bathed by helper: Right lower leg, Left lower leg Body parts n/a: Left lower leg, Right lower leg, Left upper leg, Right upper leg, Buttocks, Front perineal area   Bathing assist Assist Level: Minimal Assistance - Patient > 75%     Upper Body Dressing/Undressing Upper body dressing   What is the patient wearing?: Pull over shirt    Upper body assist Assist  Level: Set up assist    Lower Body Dressing/Undressing Lower body dressing      What is the patient wearing?: Pants, Incontinence brief     Lower body assist Assist for lower body dressing: Maximal Assistance - Patient 25 - 49%     Toileting Toileting Toileting Activity did not occur (Clothing management and hygiene only): N/A (no void or bm)  Toileting assist Assist for toileting: Maximal Assistance - Patient 25 - 49%     Transfers Chair/bed transfer  Transfers assist     Chair/bed transfer assist level: Supervision/Verbal cueing Chair/bed transfer assistive device: Armrests   Locomotion Ambulation   Ambulation assist    Ambulation activity did not occur: Safety/medical concerns          Walk 10 feet activity   Assist  Walk 10 feet activity did not occur: Safety/medical concerns        Walk 50 feet activity   Assist Walk 50 feet with 2 turns activity did not occur: Safety/medical concerns         Walk 150 feet activity   Assist Walk 150 feet activity did not occur: Safety/medical concerns         Walk 10 feet on uneven surface  activity   Assist Walk 10 feet on uneven surfaces activity did not occur: Safety/medical concerns         Wheelchair     Assist Is the patient using a wheelchair?: Yes Type of Wheelchair: Manual    Wheelchair assist level: Supervision/Verbal cueing Max wheelchair distance: 300    Wheelchair 50 feet with 2 turns activity    Assist        Assist Level: Supervision/Verbal cueing   Wheelchair 150 feet activity     Assist      Assist Level: Supervision/Verbal cueing   Blood pressure 117/65, pulse (!) 55, temperature 99 F (37.2 C), temperature source Oral, resp. rate 16, height 5\' 11"  (1.803 m), weight 71.5 kg, SpO2 97 %. Medical Problem List and Plan: 1.   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  Antibiotic course completed..  Follow-up with ID as outpatient with immunologic work-up and viral serology  Con't PT and OT/CIR_ gave pt SW number to call about paperwork- to this can be addressed 2.  Antithrombotics: -DVT/anticoagulation:  Pharmaceutical: Lovenox.  Check vascular study  10/11- vascular study (-)  10/12- will need Lovenox for 3 months total due to risk of DVT being so high.              -antiplatelet therapy: N/A 3. Pain Management: Tylenol/Advil as needed  Lidoderm patches- 3 of them 8pm to 8am- if not enough, might try tylenol #3.   Relatively controlled on 10/15 4. Mood: Provide emotional support             -antipsychotic agents: N/A 5. Neuropsych: This patient is capable of  making decisions on his own behalf. 6. Skin/Wound Care: Routine skin checks             -reiterated to pt that he needs to wear PRAFO's for heel protection and to stretch heel cords  Discussed with nursing size/fitting  10/17- con't PRAFOs at night- con't regimen- but need to make sure fitting correctly 7. Fluids/Electrolytes/Nutrition: Routine in and outs 8.  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  Keppra 500 mg twice daily 9.  Sinus bradycardia.  Echocardiogram unremarkable.  No further work-up indicated per cardiology services 10.  Neurogenic bladder/Urinary retention.  Failed voiding  trial.               cath if needed- if volumes >300cc  Flomax increased on 10/15, monitor blood pressures  10/17- still not voiding- con't caths and flomax increased this weekend  10/18- pt just wishing could void- explained might be able to, but just don't know 11. Neurogenic bowel: begin scheduled senokot-s at HS.             -miralax daily             -consider bowel program depending upon continence and regularity             -pt reports no sense of bowel emptying currently.  10/17- pt having results with/after bowel program- con't regimen 12. At level SCI pain  10/10- around ribs- feels so tight- it's due to at level SCI pain- starting Lidoderm.   10/17 con't regimen- controlled 13.  Mild hyponatremia  Sodium 134 on 10/10, continue to monitor 14.  Elevated BUN  Encourage fluids, labs ordered for Monday  10/17- BN 27- Cr 1.00- will con't to push fluids-  15. R heel DTI and blood blister- local wound care and wear PRAFOs when in bed esp to prevent DTI from opening possibly.    LOS: 10 days A FACE TO FACE EVALUATION WAS PERFORMED  Addalee Kavanagh 04/16/2021, 10:41 AM

## 2021-04-17 NOTE — Progress Notes (Signed)
Physical Therapy Session Note  Patient Details  Name: James Obrien MRN: [id number] Date of Birth: [birth year]  Today's Date: 04/17/2021 PT Individual Time: 0800-0900, 1345-1445 PT Individual Time Calculation (min): 60 min, 60 min   Short Term Goals: Week 1:  PT Short Term Goal 1 (Week 1): Pt will perform bed mobility with modA +1. PT Short Term Goal 1 - Progress (Week 1): Met PT Short Term Goal 2 (Week 1): Pt will perform bed to chair transfer with modA +1. PT Short Term Goal 2 - Progress (Week 1): Met PT Short Term Goal 3 (Week 1): Pt will maintain static sitting balance for 5 minutes with close supervision. PT Short Term Goal 3 - Progress (Week 1): Met Week 2:  PT Short Term Goal 1 (Week 2): Pt will stand with mod A to RW PT Short Term Goal 2 (Week 2): Pt will initiate gait training PT Short Term Goal 3 (Week 2): Pt will perform dynamic reaching tasks outside BOS without LOB x 5 min  Skilled Therapeutic Interventions/Progress Updates:    Session 1: Pt received in bed asleep, but easily roused with his wife present. Pt reports some cramping in his legs and back, which he attributes to sleeping in odd positions. Pt reports that he did not receive breakfast. Consulted with nsg to ensure pt was not NPO, and retrieved tray. Bed mobility with supervision, pt sat EOB to eat with distant supervision while discussing his cramping symptoms. Pt then performed supervision transfer to w/c and managed leg rests. Pt propelled w/c with BUE to gym, >150 ft. Squat pivot transfer w/c<>mat with supervision. Pt transitioned into long sitting and circle sitting with supervision, transitioning between positions and maintaining sitting balance with supervision. Pt performed static stretches in each position. Sit ups from wedge x 5, then from flat mat 2 x 7 for improved bed mobility and ADLs. Pt propelled chair back to room and remained in chair, was left with all needs in reach and alarm active.   Session  2: Pt seated in w/c on arrival and agreeable to therapy. Session focused on problem solving car transfer in pt's van. Pt propelled w/c with BUE to [facility] entrance with supervision. While waiting, pt propelled w/c up/down 200 ft incline x 3, including weaving through poles for improved endurance and obstacle navigation. Pt then performed car transfer x 4 with CGA with varying techniques. Pt uses handle in pan to pull to nearly standing to pivot hips onto seat. Slideboard would not be practical d/t seat height difference. Pt returned to room and performed squat pivot transfer to bed with distant supervision, sit>supine with supervision and min A to position BLE. Pt remained in bed and was left with all needs in reach and alarm active.   Therapy Documentation Precautions:  Precautions Precautions: Fall Precaution Comments: paraparesis; check BP Other Brace: TED hose Restrictions Weight Bearing Restrictions: No General:     Therapy/Group: Individual Therapy  Mickel Fuchs 04/17/2021, 7:43 AM

## 2021-04-17 NOTE — Progress Notes (Signed)
PROGRESS NOTE   Subjective/Complaints:  Pt reports slept OK except still requiring in/out caths and interrupts sleep.  Still having sweats above waist- mainly face/neck, however rom was very warm last night as well- even per wife.  Having some random muscle spasms- jerking- occurred just a few times- not painful- doesn't impair function.      ROS:   Pt denies SOB, abd pain, CP, N/V/C/D, and vision changes   Objective:   No results found. No results for input(s): WBC, HGB, HCT, PLT in the last 72 hours.  Recent Labs    04/15/21 1405  NA 139  K 3.8  CL 102  CO2 30  GLUCOSE 96  BUN 15  CREATININE 0.96  CALCIUM 9.3     Intake/Output Summary (Last 24 hours) at 04/17/2021 1100 Last data filed at 04/17/2021 1610 Gross per 24 hour  Intake 720 ml  Output 2800 ml  Net -2080 ml     Pressure Injury 04/12/21 Heel Right Deep Tissue Pressure Injury - Purple or maroon localized area of discolored intact skin or blood-filled blister due to damage of underlying soft tissue from pressure and/or shear. Konrad Dolores RN  verification (Active)  04/12/21 1045  Location: Heel  Location Orientation: Right  Staging: Deep Tissue Pressure Injury - Purple or maroon localized area of discolored intact skin or blood-filled blister due to damage of underlying soft tissue from pressure and/or shear.  Wound Description (Comments): Konrad Dolores RN  verification  Present on Admission: Yes    Physical Exam: Vital Signs Blood pressure (!) 112/59, pulse (!) 58, temperature 98.3 F (36.8 C), temperature source Oral, resp. rate 16, height 5\' 11"  (1.803 m), weight 71.5 kg, SpO2 98 %.       General: awake, alert, appropriate, woke him up- wife at bedside; supine in bed; NAD HENT: conjugate gaze; oropharynx moist CV: regular  rhythm; borderline bradycardic rate; no JVD Pulmonary: CTA B/L; no W/R/R- good air movement GI: soft, NT,  ND, (+)BS Psychiatric: appropriate- flat Neurological: Ox3 Skin: R heel/back of heel and over edge of bottom of heel DTI-  ~ 1.5-2 inches in diameter- no open- brown/purple color Also has a blood blister- that looks like seeping slightly- serous drainage on achilles tendon on R Musc: No edema in extremities.  No tenderness in extremities. Neurological: Alert and oriented Decreased LT/pain sense aroung T5-6 with legs more affected than trunk, unchanged.  Motor: LLE 2/5 HE, KE, 1/5 ADF.  RLE abduction, knee extension 1+/5, ankle dorsiflexion 0/5   Assessment/Plan: 1. Functional deficits which require 3+ hours per day of interdisciplinary therapy in a comprehensive inpatient rehab setting. Physiatrist is providing close team supervision and 24 hour management of active medical problems listed below. Physiatrist and rehab team continue to assess barriers to discharge/monitor patient progress toward functional and medical goals  Care Tool:  Bathing    Body parts bathed by patient: Right arm, Left arm, Chest, Abdomen, Face, Front perineal area, Buttocks, Right upper leg, Left upper leg, Right lower leg, Left lower leg   Body parts bathed by helper: Right lower leg, Left lower leg Body parts n/a: Left lower leg, Right lower leg, Left upper leg, Right  upper leg, Buttocks, Front perineal area   Bathing assist Assist Level: Minimal Assistance - Patient > 75%     Upper Body Dressing/Undressing Upper body dressing   What is the patient wearing?: Pull over shirt    Upper body assist Assist Level: Set up assist    Lower Body Dressing/Undressing Lower body dressing      What is the patient wearing?: Pants, Incontinence brief     Lower body assist Assist for lower body dressing: Moderate Assistance - Patient 50 - 74%     Toileting Toileting Toileting Activity did not occur Press photographer and hygiene only): N/A (no void or bm)  Toileting assist Assist for toileting: Maximal  Assistance - Patient 25 - 49%     Transfers Chair/bed transfer  Transfers assist     Chair/bed transfer assist level: Supervision/Verbal cueing Chair/bed transfer assistive device: Armrests   Locomotion Ambulation   Ambulation assist   Ambulation activity did not occur: Safety/medical concerns          Walk 10 feet activity   Assist  Walk 10 feet activity did not occur: Safety/medical concerns        Walk 50 feet activity   Assist Walk 50 feet with 2 turns activity did not occur: Safety/medical concerns         Walk 150 feet activity   Assist Walk 150 feet activity did not occur: Safety/medical concerns         Walk 10 feet on uneven surface  activity   Assist Walk 10 feet on uneven surfaces activity did not occur: Safety/medical concerns         Wheelchair     Assist Is the patient using a wheelchair?: Yes Type of Wheelchair: Manual    Wheelchair assist level: Supervision/Verbal cueing Max wheelchair distance: >563ft    Wheelchair 50 feet with 2 turns activity    Assist        Assist Level: Supervision/Verbal cueing   Wheelchair 150 feet activity     Assist      Assist Level: Supervision/Verbal cueing   Blood pressure (!) 112/59, pulse (!) 58, temperature 98.3 F (36.8 C), temperature source Oral, resp. rate 16, height 5\' 11"  (1.803 m), weight 71.5 kg, SpO2 98 %. Medical Problem List and Plan: 1.   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  Antibiotic course completed..  Follow-up with ID as outpatient with immunologic work-up and viral serology  Con't PT and OT/CIR 2.  Antithrombotics: -DVT/anticoagulation:  Pharmaceutical: Lovenox.  Check vascular study  10/11- vascular study (-)  10/12- will need Lovenox for 3 months total due to risk of DVT being so high.              -antiplatelet therapy: N/A 3. Pain Management: Tylenol/Advil as needed  Lidoderm patches- 3 of them 8pm to 8am-  if not enough, might try tylenol #3.   10/19- pain controlled- is having rare spasms- con't regimen 4. Mood: Provide emotional support             -antipsychotic agents: N/A 5. Neuropsych: This patient is capable of making decisions on his own behalf. 6. Skin/Wound Care: Routine skin checks             -reiterated to pt that he needs to wear PRAFO's for heel protection and to stretch heel cords  Discussed with nursing size/fitting  10/17- con't PRAFOs at night- con't regimen- but need to make sure fitting correctly 7. Fluids/Electrolytes/Nutrition: Routine in and outs  8.  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  Keppra 500 mg twice daily 9.  Sinus bradycardia.  Echocardiogram unremarkable.  No further work-up indicated per cardiology services  10/19- heart rate running in 50s-- con't to monitor 10.  Neurogenic bladder/Urinary retention.  Failed voiding trial.               cath if needed- if volumes >300cc  Flomax increased on 10/15, monitor blood pressures  10/17- still not voiding- con't caths and flomax increased this weekend  10/18- pt just wishing could void- explained might be able to, but just don't know 11. Neurogenic bowel: begin scheduled senokot-s at HS.             -miralax daily             -consider bowel program depending upon continence and regularity             -pt reports no sense of bowel emptying currently.  101/9- having good results with bowel program- con't regimen 12. At level SCI pain  10/10- around ribs- feels so tight- it's due to at level SCI pain- starting Lidoderm.   10/17 con't regimen- controlled 13.  Mild hyponatremia  Sodium 134 on 10/10, continue to monitor 14.  Elevated BUN  Encourage fluids, labs ordered for Monday  10/17- BN 27- Cr 1.00- will con't to push fluids-  15. R heel DTI and blood blister- local wound care and wear PRAFOs when in bed esp to prevent DTI from opening possibly.  16. Developing spasticity-   10/19- on exam,  looks OK- no increased MAS- is rare- explained it's normal to occur- will monitor 17. Sweating above level of lesion  10/19- likely due to temp dysregulation- will monitor closely.    LOS: 11 days A FACE TO FACE EVALUATION WAS PERFORMED  Kaina Orengo 04/17/2021, 11:00 AM

## 2021-04-17 NOTE — Progress Notes (Signed)
Pt completed bowel program. Pt refuses to have dig stim. Pt had Large, Type 6 BM. Peri care done. Pt tolerated well.

## 2021-04-17 NOTE — Progress Notes (Signed)
Occupational Therapy Session Note  Patient Details  Name: Skylan Lara MRN: [id number] Date of Birth: 01-08-1986  Today's Date: 04/17/2021 OT Individual Time: 8185-9093 OT Individual Time Calculation (min): 70 min    Short Term Goals: Week 1:  OT Short Term Goal 1 (Week 1): patient will complete lower body bathing dressing at bed level with min A using assistive devices OT Short Term Goal 1 - Progress (Week 1): Progressing toward goal OT Short Term Goal 2 (Week 1): patient will complete upper body bathing and dressing w/c level with set up OT Short Term Goal 2 - Progress (Week 1): Met OT Short Term Goal 3 (Week 1): patient will sit unsupported for 10+ minutes with min A OT Short Term Goal 3 - Progress (Week 1): Met OT Short Term Goal 4 (Week 1): patient will complete SB transfers with mod A of one OT Short Term Goal 4 - Progress (Week 1): Met Week 2:  OT Short Term Goal 1 (Week 2): Pt will complete bathing shower level MIN A with AE PRN OT Short Term Goal 2 (Week 2): Pt will perform BSC/toilet transfers CGA with no more than occasional cues for form OT Short Term Goal 3 (Week 2): Pt will perform LB dressing bed level Min A and adapative equipement PRN OT Short Term Goal 4 (Week 2): Pt will consistently perform squat pivot transfers CGA to various surfaces in prep for ADL   Skilled Therapeutic Interventions/Progress Updates:    Pt greeted at time of session semireclined in bed resting, agreeable to OT session, no reports of pain throughout. ADL needs met, politely declined. Supine > sit from flat bed to simulate home environment using bed rail with Supervision. Squat pivot bed > wheelchair with Supervision, self propel room > elevator > 4th floor gym with Supervision. Squat pivot w/c <> NUSTEP with CGA/Supervision as wheelchair brake not fully locked but pt able to correct. Nustep on level 6 for 8:30 with encouraging pt to engage quads and primarily use BLEs with Ue's assisting. W/c <>  mat with Supervision, and focused on EOM with multiple core strengthening/NMR/body awareness activities with lateral leans, large therapy ball roll outs, and 1# dowel hits. All to encourage the pt to improve body awareness and dynamic sitting balance. Sit <> stands at [facility] with B knees blocked from elevated mat table x3 trials with Min A as pt relying on BUE's but cues for pushing up from surface instead of pulling self up. Sit <> stands x2 trials with HHA from 2 helpers and B knees blocked for additional weight bearing through [place]. Self propel back to room, alarm on call bell in reach.    Therapy Documentation Precautions:  Precautions Precautions: Fall Precaution Comments: paraparesis; check BP Other Brace: TED hose Restrictions Weight Bearing Restrictions: No     Therapy/Group: Individual Therapy  [name] 04/17/2021, 7:13 AM

## 2021-04-18 MED ORDER — BETHANECHOL CHLORIDE 10 MG PO TABS
10.0000 mg | ORAL_TABLET | Freq: Three times a day (TID) | ORAL | Status: DC
  Administered 2021-04-18 – 2021-04-25 (×22): 10 mg via ORAL
  Filled 2021-04-18 (×22): qty 1

## 2021-04-18 NOTE — Progress Notes (Signed)
Occupational Therapy Session Note  Patient Details  Name: James Obrien MRN: [id number] Date of Birth: [birth year]  Today's Date: 04/18/2021 OT Individual Time: 8832-5498 OT Individual Time Calculation (min): 57 min    Short Term Goals: Week 1:  OT Short Term Goal 1 (Week 1): patient will complete lower body bathing dressing at bed level with min A using assistive devices OT Short Term Goal 1 - Progress (Week 1): Progressing toward goal OT Short Term Goal 2 (Week 1): patient will complete upper body bathing and dressing w/c level with set up OT Short Term Goal 2 - Progress (Week 1): Met OT Short Term Goal 3 (Week 1): patient will sit unsupported for 10+ minutes with min A OT Short Term Goal 3 - Progress (Week 1): Met OT Short Term Goal 4 (Week 1): patient will complete SB transfers with mod A of one OT Short Term Goal 4 - Progress (Week 1): Met Week 2:  OT Short Term Goal 1 (Week 2): Pt will complete bathing shower level MIN A with AE PRN OT Short Term Goal 2 (Week 2): Pt will perform BSC/toilet transfers CGA with no more than occasional cues for form OT Short Term Goal 3 (Week 2): Pt will perform LB dressing bed level Min A and adapative equipement PRN OT Short Term Goal 4 (Week 2): Pt will consistently perform squat pivot transfers CGA to various surfaces in prep for ADL   Skilled Therapeutic Interventions/Progress Updates:    Pt greeted at time of session semireclined in bed agreeable to OT sesison, no pain. Supine > sit Mod I with bed features. Squat pivot bed > wheelchair with Supervision for cues for foot placement. Self propel to bathroom and set up w/c at [facility] in shower with Supervision for proximity. Squat pivot w/c <> DABSC in shower with Supervision. Doffed clothing with lateral leans and UB/LB bathing with CGA seated, cues to not reach too far out of BOS. Donned shirt set up and brief (in form of underwear) and pants with figure four Min A with extended time and multiple  lateral leans. Donned socks with adaptive technique and Mod A. Back in chair, self propelling throughout room for oral hygiene and to reach items needed. Alarm on call bell in reach.    Therapy Documentation Precautions:  Precautions Precautions: Fall Precaution Comments: paraparesis; check BP Other Brace: TED hose Restrictions Weight Bearing Restrictions: No     Therapy/Group: Individual Therapy  [name] 04/18/2021, 7:20 AM

## 2021-04-18 NOTE — Progress Notes (Signed)
Patient ID: James Obrien, male   DOB: 1986-05-27, 35 y.o.   MRN: [id number]  Drop arm commode and transfer bench ordered through [employer]  [name], [place] [phone]

## 2021-04-18 NOTE — Progress Notes (Signed)
Occupational Therapy Session Note  Patient Details  Name: James Obrien MRN: [id number] Date of Birth: 1985/09/04  Today's Date: 04/19/2021 OT Individual Time: 9983-3825 and 1525-1555 OT Individual Time Calculation (min): 57 min and 30 min   Skilled Therapeutic Interventions/Progress Updates:    Pt greeted in bed with no c/o pain, stating that his d/c plan has changed and now he is going to park his camper on the property of a good friend who will make his camper w/c accessible. Discussed at length his plans for camper, bed, toilet, and shower access. Pt has a good awareness of his deficits though question safety regarding his plans/methods for accessing home. Discussed having supplies near toilet at home to self  cath and perform bowel program independently as he does not want his wife to assist, OT advised pt to start practicing with staff today for optimal proficiency with bowel program + self cathing at time of d/c. Next worked on sit<stands with RW and also forward/backward stepping with OT supporting his Rt side. Longest static stand with RW ~28 seconds, B UEs supported on RW given CGA. After that length of time, pt with B knee buckling. During stepping, pt used a supportive chair while weightshifting Lt>Rt as needed given cues. Pt remained sitting up in the w/c, left with all needs within reach.   2nd Session 1:1 tx (30 min) Pt greeted in the w/c with no c/o pain. He declined working on standing during tx, opting to instead go outside. Worked on UB strengthening/endurance while pt self propelled the w/c over uneven concrete and up/down inclines. Reviewed pressure relief positions in the w/c, w/c push ups to increase LE weightbearing, and UB stretching routine. He continues to require cuing to lock his w/c brakes at appropriate times. Pt then self propelled back to the room and remained sitting up, all needs within reach.   Therapy Documentation Precautions:  Precautions Precautions:  Fall Precaution Comments: paraparesis; check BP Other Brace: TED hose Restrictions Weight Bearing Restrictions: No   ADL: ADL Eating: Set up Where Assessed-Eating: Bed level Grooming: Setup Where Assessed-Grooming: Edge of bed Upper Body Bathing: Setup Where Assessed-Upper Body Bathing: Edge of bed Lower Body Bathing: Maximal assistance Where Assessed-Lower Body Bathing: Bed level Upper Body Dressing: Minimal assistance Where Assessed-Upper Body Dressing: Edge of bed Lower Body Dressing: Maximal assistance Where Assessed-Lower Body Dressing: Bed level Toileting: Not assessed Where Assessed-Toileting: Bed level Toilet Transfer: Not assessed ADL Comments: requires max A to maintain sitting edge of bed for grooming and upper body adl   Therapy/Group: Individual Therapy  Anari Evitt A Rena Sweeden 04/19/2021, 4:50 PM

## 2021-04-18 NOTE — Progress Notes (Signed)
Pt had a soft brown medium sized BM. Bowel protocol done on previous shift.

## 2021-04-18 NOTE — Progress Notes (Signed)
Physical Therapy Session Note  Patient Details  Name: James Obrien MRN: [id number] Date of Birth: 04/26/1986  Today's Date: 04/18/2021 PT Individual Time: 1000-1059, 1400-1502 PT Individual Time Calculation (min): 59 min, 62 min   Short Term Goals: Week 1:  PT Short Term Goal 1 (Week 1): Pt will perform bed mobility with modA +1. PT Short Term Goal 1 - Progress (Week 1): Met PT Short Term Goal 2 (Week 1): Pt will perform bed to chair transfer with modA +1. PT Short Term Goal 2 - Progress (Week 1): Met PT Short Term Goal 3 (Week 1): Pt will maintain static sitting balance for 5 minutes with close supervision. PT Short Term Goal 3 - Progress (Week 1): Met Week 2:  PT Short Term Goal 1 (Week 2): Pt will stand with mod A to RW PT Short Term Goal 2 (Week 2): Pt will initiate gait training PT Short Term Goal 3 (Week 2): Pt will perform dynamic reaching tasks outside BOS without LOB x 5 min  Skilled Therapeutic Interventions/Progress Updates:    Session 1:  Pt seated in w/c on arrival and agreeable to therapy. No complaint of pain. First part of session spent on w/c eval with Deatra Ina from [facility]. Discovered later that pt's insurance will only cover 50% of specialty w/c. Presented pt with options during second session, pt stated he would call [facility] as he believes they might be able to offer assistance.  After eval, pt propelled chair down to cafeteria in search of coffee, but they were closed. Returned to [employer] to [employer], applications on 3" curb using wheelie technique. Required max A initially, fading to CGA. Pt returned to room and remained sitting in w/c, was left with all needs in reach and alarm active.   Session 2: Pt seated in w/c on arrival and agreeable to therapy. No complaint of pain. Pt propelled w/c with BUE to therapy gym mod I for endurance and mobility. Pt then instructed on and performed floor transfer <> w/c with CGA fading to supervision. Attempted multiple  methods, found lifting self up with back to chair to be most effective and safest. Pt expressed confidence with this technique. Pt then directed in wheelies onto 5" curb step with CGA throughout. Then directed in attempting to hold wheelie, up to 6 sec. Occ required assistance to prevent tipping backward. Pt returned to room and performed Sit to stand to RW with max A at RLE to maintain upright position, no assist to power up. Could not achieve neutral pelvic alignment d/t lack of glute activation, R>L. Pt returned to bed after  session with supervision squat pivot transfer. Pt was left with all needs in reach and alarm active.   Therapy Documentation Precautions:  Precautions Precautions: Fall Precaution Comments: paraparesis; check BP Other Brace: TED hose Restrictions Weight Bearing Restrictions: No    Therapy/Group: Individual Therapy  Mickel Fuchs 04/18/2021, 5:00 PM

## 2021-04-18 NOTE — Progress Notes (Addendum)
Patient ID: James Obrien, male   DOB: 01/01/1986, 35 y.o.   MRN: [id number]  Pt OP referral sent to [facility] by [facility] in [place], [place]  W:[phone] F: [phone]  [name], BSW [phone]

## 2021-04-18 NOTE — Progress Notes (Signed)
PROGRESS NOTE   Subjective/Complaints:  Per nursing, pt refusing dig stim part of bowel program- only allowing suppository.  Pt refusing flu shot.  Didn't sleep well- mind restless and couldn't get comfortable- fell asleep at 12-1am.  Still sweating, but room warm and wife also sweating.  No more spasms so far in last 24 hours.  Relearning to write is L handed.  Tried to void for 2 hour- had 2 drops of pee-   W/C evaluation today to determine best ultra light w/c.   Had BM while sleeping yesterday  AM.   ROS:   Pt denies SOB, abd pain, CP, N/V/C/D, and vision changes   Objective:   No results found. No results for input(s): WBC, HGB, HCT, PLT in the last 72 hours.  Recent Labs    04/15/21 1405  NA 139  K 3.8  CL 102  CO2 30  GLUCOSE 96  BUN 15  CREATININE 0.96  CALCIUM 9.3     Intake/Output Summary (Last 24 hours) at 04/18/2021 0853 Last data filed at 04/18/2021 0830 Gross per 24 hour  Intake 840 ml  Output 3000 ml  Net -2160 ml     Pressure Injury 04/12/21 Heel Right Deep Tissue Pressure Injury - Purple or maroon localized area of discolored intact skin or blood-filled blister due to damage of underlying soft tissue from pressure and/or shear. Konrad Dolores RN  verification (Active)  04/12/21 1045  Location: Heel  Location Orientation: Right  Staging: Deep Tissue Pressure Injury - Purple or maroon localized area of discolored intact skin or blood-filled blister due to damage of underlying soft tissue from pressure and/or shear.  Wound Description (Comments): Konrad Dolores RN  verification  Present on Admission: Yes    Physical Exam: Vital Signs Blood pressure 116/75, pulse 68, temperature 98.5 F (36.9 C), temperature source Oral, resp. rate 16, height 5\' 11"  (1.803 m), weight 71.5 kg, SpO2 96 %.        General: awake, alert, appropriate, sitting up slightly in bed; NAD HENT: conjugate  gaze; oropharynx moist CV: regular rate; no JVD Pulmonary: CTA B/L; no W/R/R- good air movement GI: soft, NT, ND, (+)BS- hypoactive Psychiatric: appropriate Neurological: Ox3 Skin: R heel/back of heel and over edge of bottom of heel DTI-  ~ 1.5-2 inches in diameter- no open- brown/purple color Also has a blood blister- that looks like seeping slightly- serous drainage on achilles tendon on R- looks the same today Musc: No edema in extremities.  No tenderness in extremities. Neurological: Alert and oriented Decreased LT/pain sense aroung T5-6 with legs more affected than trunk, unchanged.  Motor: LLE 2/5 HE, KE, 1/5 ADF.  RLE abduction, knee extension 1+/5, ankle dorsiflexion 0/5   Assessment/Plan: 1. Functional deficits which require 3+ hours per day of interdisciplinary therapy in a comprehensive inpatient rehab setting. Physiatrist is providing close team supervision and 24 hour management of active medical problems listed below. Physiatrist and rehab team continue to assess barriers to discharge/monitor patient progress toward functional and medical goals  Care Tool:  Bathing    Body parts bathed by patient: Right arm, Left arm, Chest, Abdomen, Face, Front perineal area, Buttocks, Right upper leg, Left  upper leg, Right lower leg, Left lower leg   Body parts bathed by helper: Right lower leg, Left lower leg Body parts n/a: Left lower leg, Right lower leg, Left upper leg, Right upper leg, Buttocks, Front perineal area   Bathing assist Assist Level: Minimal Assistance - Patient > 75%     Upper Body Dressing/Undressing Upper body dressing   What is the patient wearing?: Pull over shirt    Upper body assist Assist Level: Set up assist    Lower Body Dressing/Undressing Lower body dressing      What is the patient wearing?: Pants, Incontinence brief     Lower body assist Assist for lower body dressing: Moderate Assistance - Patient 50 - 74%     Toileting Toileting  Toileting Activity did not occur Press photographer and hygiene only): N/A (no void or bm)  Toileting assist Assist for toileting: Maximal Assistance - Patient 25 - 49%     Transfers Chair/bed transfer  Transfers assist     Chair/bed transfer assist level: Supervision/Verbal cueing Chair/bed transfer assistive device: Armrests   Locomotion Ambulation   Ambulation assist   Ambulation activity did not occur: Safety/medical concerns          Walk 10 feet activity   Assist  Walk 10 feet activity did not occur: Safety/medical concerns        Walk 50 feet activity   Assist Walk 50 feet with 2 turns activity did not occur: Safety/medical concerns         Walk 150 feet activity   Assist Walk 150 feet activity did not occur: Safety/medical concerns         Walk 10 feet on uneven surface  activity   Assist Walk 10 feet on uneven surfaces activity did not occur: Safety/medical concerns         Wheelchair     Assist Is the patient using a wheelchair?: Yes Type of Wheelchair: Manual    Wheelchair assist level: Supervision/Verbal cueing Max wheelchair distance: >511ft    Wheelchair 50 feet with 2 turns activity    Assist        Assist Level: Supervision/Verbal cueing   Wheelchair 150 feet activity     Assist      Assist Level: Supervision/Verbal cueing   Blood pressure 116/75, pulse 68, temperature 98.5 F (36.9 C), temperature source Oral, resp. rate 16, height 5\' 11"  (1.803 m), weight 71.5 kg, SpO2 96 %. Medical Problem List and Plan: 1.   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  Antibiotic course completed..  Follow-up with ID as outpatient with immunologic work-up and viral serology  Con't PT and OT/CIR_ w/c evaluation today for specialized w/c and cushion. -pt also notes had a probable spider bite a few days before got sick and actually went to ER for it.   2.   Antithrombotics: -DVT/anticoagulation:  Pharmaceutical: Lovenox.  Check vascular study  10/11- vascular study (-)  10/12- will need Lovenox for 3 months total due to risk of DVT being so high.   10/20- educated pt needs to learn/wife to learn- will need for 3 months total             -antiplatelet therapy: N/A 3. Pain Management: Tylenol/Advil as needed  Lidoderm patches- 3 of them 8pm to 8am- if not enough, might try tylenol #3.   10/19- pain controlled- is having rare spasms- con't regimen 4. Mood: Provide emotional support             -  antipsychotic agents: N/A 5. Neuropsych: This patient is capable of making decisions on his own behalf. 6. Skin/Wound Care: Routine skin checks             -reiterated to pt that he needs to wear PRAFO's for heel protection and to stretch heel cords  Discussed with nursing size/fitting  10/17- con't PRAFOs at night- con't regimen- but need to make sure fitting correctly 7. Fluids/Electrolytes/Nutrition: Routine in and outs 8.  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  Keppra 500 mg twice daily 9.  Sinus bradycardia.  Echocardiogram unremarkable.  No further work-up indicated per cardiology services  10/19- heart rate running in 50s-- con't to monitor 10.  Neurogenic bladder/Urinary retention.  Failed voiding trial.               cath if needed- if volumes >300cc  Flomax increased on 10/15, monitor blood pressures  10/17- still not voiding- con't caths and flomax increased this weekend  10/18- pt just wishing could void- explained might be able to, but just don't know  10/20- pt tried for 1 hour- 2 drops of urine came out- will add Urecholine 10 mg TID and increase over next few days.  11. Neurogenic bowel: begin scheduled senokot-s at HS.             -miralax daily             -consider bowel program depending upon continence and regularity             -pt reports no sense of bowel emptying currently.  10/20- pt had bowel accident yesterday-  is refusing dig stim- will d/w pt again that needs dig stim to reduce risk of accidents.  12. At level SCI pain  10/10- around ribs- feels so tight- it's due to at level SCI pain- starting Lidoderm.   10/17 con't regimen- controlled 13.  Mild hyponatremia  Sodium 134 on 10/10, continue to monitor 14.  Elevated BUN  Encourage fluids, labs ordered for Monday  10/17- BN 27- Cr 1.00- will con't to push fluids-  15. R heel DTI and blood blister- local wound care and wear PRAFOs when in bed esp to prevent DTI from opening possibly.  16. Developing spasticity-   10/19- on exam, looks OK- no increased MAS- is rare- explained it's normal to occur- will monitor 17. Sweating above level of lesion  10/19- likely due to temp dysregulation- will monitor closely.   18. Dispo  10/20- refuses flu shot; also counseled on smoking cessation once leaves hospital.   I spent a total of 36 minutes on total care today- >50% coordination of care- speaking with w/c rep as well as prolonged d/w pt about possible causes of injury and discussing/counseling on flu shot and smoking cessation once leaves hospital.   LOS: 12 days A FACE TO FACE EVALUATION WAS PERFORMED  Vale Peraza 04/18/2021, 8:53 AM

## 2021-04-18 NOTE — Progress Notes (Signed)
Occupational Therapy Session Note  Patient Details  Name: James Obrien MRN: [id number] Date of Birth: 1986-04-28  Today's Date: 04/18/2021 OT Individual Time: 5001-6429 OT Individual Time Calculation (min): 54 min    Short Term Goals: Week 2:  OT Short Term Goal 1 (Week 2): Pt will complete bathing shower level MIN A with AE PRN OT Short Term Goal 2 (Week 2): Pt will perform BSC/toilet transfers CGA with no more than occasional cues for form OT Short Term Goal 3 (Week 2): Pt will perform LB dressing bed level Min A and adapative equipement PRN OT Short Term Goal 4 (Week 2): Pt will consistently perform squat pivot transfers CGA to various surfaces in prep for ADL  Skilled Therapeutic Interventions/Progress Updates:    Pt in wheelchair to start session agreeable to therapy.  He was able to propel his wheelchair down to the therapy gym at modified independent level.  Supervision for squat pivot transfer to the therapy mat.  Worked on core strengthening with use of a 5-7 lb weighted ball holding the ball with elbows extended at shoulder height and working on rotations for 1 set of 10 reps.  He then worked on holding therapy ball out to various positions beyond base of support using BUEs.  Min assist needed for balance.  Next, had him transition to sitting on the large therapy ball with transfer from the EOM with mod assist.  He then worked on maintaining sitting balance on the ball with min to mod assist without use of his UEs.  Rest break given on the mat at min assist level before completing second interval.  Finished session with supervision transfer back to the wheelchair.  He was able to propel himself back to the room where he was met by PT who was in for next session.    Therapy Documentation Precautions:  Precautions Precautions: Fall Precaution Comments: paraparesis; check BP Other Brace: TED hose Restrictions Weight Bearing Restrictions: No   Pain: Pain Assessment Pain  Scale: Faces Pain Score: 0-No pain ADL: See Care Tool Section for some details of mobility and selfcare   Therapy/Group: Individual Therapy  James Obrien OTR/L 04/18/2021, 2:45 PM

## 2021-04-19 DIAGNOSIS — M792 Neuralgia and neuritis, unspecified: Secondary | ICD-10-CM

## 2021-04-19 DIAGNOSIS — G479 Sleep disorder, unspecified: Secondary | ICD-10-CM

## 2021-04-19 DIAGNOSIS — T148XXA Other injury of unspecified body region, initial encounter: Secondary | ICD-10-CM

## 2021-04-19 MED ORDER — MELATONIN 3 MG PO TABS
3.0000 mg | ORAL_TABLET | Freq: Every day | ORAL | Status: DC
  Administered 2021-04-20 – 2021-04-23 (×3): 3 mg via ORAL
  Filled 2021-04-19 (×3): qty 1

## 2021-04-19 NOTE — Progress Notes (Signed)
Physical Therapy Session Note  Patient Details  Name: James Obrien MRN: [id number] Date of Birth: 1986-06-19  Today's Date: 04/19/2021 PT Individual Time: 0800-0900, 3086-5784 PT Individual Time Calculation (min): 60 min, 50 min   Short Term Goals: Week 1:  PT Short Term Goal 1 (Week 1): Pt will perform bed mobility with modA +1. PT Short Term Goal 1 - Progress (Week 1): Met PT Short Term Goal 2 (Week 1): Pt will perform bed to chair transfer with modA +1. PT Short Term Goal 2 - Progress (Week 1): Met PT Short Term Goal 3 (Week 1): Pt will maintain static sitting balance for 5 minutes with close supervision. PT Short Term Goal 3 - Progress (Week 1): Met Week 2:  PT Short Term Goal 1 (Week 2): Pt will stand with mod A to RW PT Short Term Goal 2 (Week 2): Pt will initiate gait training PT Short Term Goal 3 (Week 2): Pt will perform dynamic reaching tasks outside BOS without LOB x 5 min  Skilled Therapeutic Interventions/Progress Updates:    Session 1: Pt in bed on arrival with his wife present. Discussed using a different vendor and both were agreeable. Wheelchair mobility >500 ft to show pt location of laundry facilities and then to get coffee in cafeteria. While in laundry, stood to assess if pt could reach bottom of washer, but pt unable to maintain stable standing with min support provided. Pt navigated cafeteria mod I. Discussed risk of carrying hot coffee between his legs with limited sensation, pt minimally receptive. Pt then returned to his room and stood to RW with knee block only, up to 27 sec each trial. Pt remained in w/c at end of session and was left with all needs in reach and alarm active.   Session 2: Pt seated in w/c on arrival and agreeable to therapy. No complaint of pain. Discussed pt's home lay out with pt and wife. Pt will likely be able to maneuver chair in their camper but is unsure about the width of the door. Pt propelled w/c with BUE to therapy gym for  endurance and functional mobility. Supervision squat pivot transfer <> mat table. Sit>supine>prone with supervision. Pt then directed in transitioning to quadruped position with  min A for balance and positioning. Pt then assisted to tall kneeling position with bench on mat table. After maintaining position for several minutes, pt came to rest on upper body on bench in modified quadruped. Spent several minutes in this position for extended weight bearing through BLE. Pt participated in PNF slow reversal technique while transitioning in/out of tall kneeling for improvements in strength and control. Pt then performed the same motion with min A only for pelvic alignment. Pt returned to room and remained in w/c to await pending OT session with his wife present.   Therapy Documentation Precautions:  Precautions Precautions: Fall Precaution Comments: paraparesis; check BP Other Brace: TED hose Restrictions Weight Bearing Restrictions: No    Therapy/Group: Individual Therapy  Mickel Fuchs 04/19/2021, 3:34 PM

## 2021-04-19 NOTE — Consult Note (Signed)
Neuropsychological Consultation   Patient:   James Obrien   DOB:   February 19, 1986  MR Number:  [id number]  Location:  [facility] [address] CENTER A [address] [id number] [place] [address] Dept: [phone] Loc: [phone]           Date of Service:   04/19/2021  Start Time:   9 AM End Time:   10 AM  Provider/Observer:  Arley Phenix, Psy.D.       Clinical Neuropsychologist       Billing Code/Service: 56314  Chief Complaint:    Numair Masden is a 35 year old right-handed male with unremarkable past medical history.  Patient had recent penile/scrotal lesion that he thought may have been due to spider bite but this is unconfirmed.  Patient has past history of tobacco use.  No prescription medications.  Patient presented on 03/28/2021 with tingling and numbness in left hand which quickly progressed to lower extremities as well as witnessed generalized tonic-clonic seizure that lasted 2 to 5 minutes.  Patient was loaded with Keppra.  EEG showed no seizure.  Cranial CT was negative.  MRI showed multifocal abnormal hyperintense T2 weighted signal within both hemispheres but greatest at the right.  Postcentral gyrus.  There was concern for acute demyelination.  MRI thoracic lumbar spine longitudinally extensive cord signal abnormality/myelitis.  Patient had findings consistent with abdominal wall and left Ingenio region cellulitis.  No fluid collection or abscess.  HIV antibody nonreactive, RPR nonreactive, GC/chlamydia probe positive for gonorrhea.  Lumbar puncture showed evidence of pleocytosis and CBC with elevated white blood cell count.  Findings were consistent with myelitis, encephalitis.  Patient initially placed on Diclozor for possible encephalitis and was discontinued with negative findings for HSV.  Antibiotics were added after findings suggestive of bacterial activity.  Patient has completed antibiotic  course.  Reason for Service:  Patient was referred for neuropsychological consultation due to coping and adjustment issues with extensive and extended hospital stay.  Below see HPI for the current admission.  HPI: [name] is a 35 year old right-handed male with unremarkable past medical history except for recent penile and scrotal lesions thought to be due to a spider bite as well as tobacco use on no prescription medications.  Per chart review patient lives with spouse.  1 level home 3 steps to entry.  3 children ages 12/8/5.  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  Presented 03/28/2021 with tingling and numbness of the left hand which quickly progressed to the lower extremities as well as a witnessed generalized tonic-clonic seizure that lasted 2-5 minutes.  Patient was loaded with Keppra.  EEG showed no seizure.  Cranial CT scan negative.  MRI showed multifocal abnormal hyperintense T2 weighted signal within both hemispheres but greatest at the right pre and postcentral gyri.  Appearance was concerning for acute demyelination.  MRI thoracic lumbar spine longitudinally extensive cord signal abnormality/myelitis.  No hematoma or other complications.  CT of the chest abdomen pelvis showed subcutaneous fat stranding left lower quadrant anterior abdominal wall and left inguinal region consistent with cellulitis.  No fluid collection or abscess.  Reactive lymphadenopathy within the pelvis and bilateral inguinal regions.  Admission chemistries unremarkable except creatinine 1.27, urine cultures no growth, blood cultures no growth to date, lactic acid 4.1, HIV antibody nonreactive, RPR nonreactive, GC/chlamydia probe positive for gonorrhea..  Neurology consulted lumbar puncture showed evidence of pleocytosis and CBC with elevated WBCC.  Consistent with myelitis, encephalitis.  Patient  was initially placed on empiric acyclovir for possible encephalitis discontinued 10/4 with negative HSV.  ID did  see the patient and bacterial antibiotics were added after neutrophilic CSF was seen in addition to blood cultures positive for methicillin sensitive Staphylococcus epidermidis.  He was treated with a lower dose of steroids to help reduce potential spinal cord swelling.  Patient's antibiotic course has been completed.  Hospital course patient did have some bradycardia with cardiology follow-up and echocardiogram completed with ejection fraction of 55 to 60% no wall motion abnormality and no further work-up was indicated.  Patient bouts of urinary retention and failed voiding trial with Foley tube in place and Flomax initiated.  Lovenox added for DVT prophylaxis.  Therapy evaluations completed due to patient decreased functional mobility was admitted for a comprehensive rehab program.  Current Status:  Patient was sitting up on his bed awake and alert when I entered the room.  Wife was getting instructions on how to use catheters and he and his wife were joking about the subject.  Patient was awake and alert displaying positive mood state although he did admit that this is been a stressful situation.  Patient had open question about etiological factors and admitted that he had asked essentially everyone and I was unable to provide further details about what exactly is caused all of his difficulties.  Patient acknowledges making improvements but continued to have some motor functions.  Unclear how much of his current personality style is longstanding or changes with his recent neurological involvement.  Patient was appropriate throughout but did have some clear indications of impulsivity and somewhat expanded verbosity.  He was oriented with good mental status although some issues with attention.  Motor function changes were noted and acknowledged by the patient.  Patient expressed apprehension about living situation as he had been living in a camper even prior to this and now with financial stress he does not have  the financial means to continue to pay lot rent and has been seeking to move his RV/camper to his parents house but they have put up roadblocks and worries about the legality of him putting it there.  This has created stress for the patient.  Behavioral Observation: Riely Baskett  presents as a 35 y.o.-year-old Right Caucasian Male who appeared his stated age. his dress was Appropriate and he was Well Groomed and his manners were Appropriate to the situation.  his participation was indicative of Redirectable behaviors.  There were physical disabilities noted.  he displayed an appropriate level of cooperation and motivation.     Interactions:    Active Redirectable  Attention:   abnormal and attention span appeared shorter than expected for age  Memory:   within normal limits; recent and remote memory intact  Visuo-spatial:  not examined  Speech (Volume):  normal  Speech:   normal; rapid  Thought Process:  Tangential  Though Content:  WNL; not suicidal and not homicidal  Orientation:   person, place, time/date, and situation  Judgment:   Fair  Planning:   Poor  Affect:    Excited  Mood:    Euthymic  Insight:   Fair  Intelligence:   normal  Medical History:   Past Medical History:  Diagnosis Date   Medical history non-contributory          Patient Active Problem List   Diagnosis Date Noted   Elevated BUN    Hyponatremia    Neurogenic bowel    Neurogenic bladder  Encephalitis 04/06/2021   Myelitis (HCC) 04/06/2021   Bradycardia    Seizure (HCC) 03/28/2021   Gonorrhea 03/28/2021   Cellulitis 03/28/2021         Psychiatric History:  No prior psychiatric history noted.  Family Med/Psych History:  Family History  Problem Relation Age of Onset   Seizures Neg Hx     Risk of Suicide/Violence: virtually non-existent patient denies any suicidal homicidal ideation.  Impression/DX:  Jamari Moten is a 35 year old right-handed male with unremarkable  past medical history.  Patient had recent penile/scrotal lesion that he thought may have been due to spider bite but this is unconfirmed.  Patient has past history of tobacco use.  No prescription medications.  Patient presented on 03/28/2021 with tingling and numbness in left hand which quickly progressed to lower extremities as well as witnessed generalized tonic-clonic seizure that lasted 2 to 5 minutes.  Patient was loaded with Keppra.  EEG showed no seizure.  Cranial CT was negative.  MRI showed multifocal abnormal hyperintense T2 weighted signal within both hemispheres but greatest at the right.  Postcentral gyrus.  There was concern for acute demyelination.  MRI thoracic lumbar spine longitudinally extensive cord signal abnormality/myelitis.  Patient had findings consistent with abdominal wall and left Ingenio region cellulitis.  No fluid collection or abscess.  HIV antibody nonreactive, RPR nonreactive, GC/chlamydia probe positive for gonorrhea.  Lumbar puncture showed evidence of pleocytosis and CBC with elevated white blood cell count.  Findings were consistent with myelitis, encephalitis.  Patient initially placed on Diclozor for possible encephalitis and was discontinued with negative findings for HSV.  Antibiotics were added after findings suggestive of bacterial activity.  Patient has completed antibiotic course.  Patient was sitting up on his bed awake and alert when I entered the room.  Wife was getting instructions on how to use catheters and he and his wife were joking about the subject.  Patient was awake and alert displaying positive mood state although he did admit that this is been a stressful situation.  Patient had open question about etiological factors and admitted that he had asked essentially everyone and I was unable to provide further details about what exactly is caused all of his difficulties.  Patient acknowledges making improvements but continued to have some motor functions.   Unclear how much of his current personality style is longstanding or changes with his recent neurological involvement.  Patient was appropriate throughout but did have some clear indications of impulsivity and somewhat expanded verbosity.  He was oriented with good mental status although some issues with attention.  Motor function changes were noted and acknowledged by the patient.  Patient expressed apprehension about living situation as he had been living in a camper even prior to this and now with financial stress he does not have the financial means to continue to pay lot rent and has been seeking to move his RV/camper to his parents house but they have put up roadblocks and worries about the legality of him putting it there.  This has created stress for the patient.  Disposition/Plan:  Today we worked on coping and adjustment issues with the patient's upcoming discharge.  Patient has some apprehension about how we will do including living arrangements and how he will do with his inability to return back to work.  Diagnosis:    Myelitis (HCC) - Plan: Ambulatory referral to Neurology, Ambulatory referral to Physical Medicine Rehab         Electronically Signed   _______________________  Arley Phenix, Psy.D. Clinical Neuropsychologist

## 2021-04-19 NOTE — Progress Notes (Deleted)
Pt declining I&O cath at this time. Bladder scanned for 147.

## 2021-04-19 NOTE — Progress Notes (Signed)
Occupational Therapy Session Note  Patient Details  Name: James Obrien MRN: [id number] Date of Birth: [birth year]  Today's Date: 04/20/2021 OT Group Time: 1100-1200 OT Group Time Calculation (min): 60 min  Skilled Therapeutic Interventions/Progress Updates:    Pt engaged in therapeutic w/c level dance group focusing on patient choice, UE/LE strengthening, salience, activity tolerance, and social participation. Pt was guided through various dance-based exercises involving UEs/LEs and trunk. All music was selected by group members. Emphasis placed on activity tolerance and AAROM for B LEs. Pt required vcs to lock his w/c brakes in prep for dynamic seated activity. He exhibited good participation for duration of group, wife also in attendance. Pt social and appearing in bright spirits while listening to meaningful music. Wife returned pt to the room via w/c.    Therapy Documentation Precautions:  Precautions Precautions: Fall Precaution Comments: paraparesis; check BP Other Brace: TED hose Restrictions Weight Bearing Restrictions: No  Pain: no s/s pain during tx Pain Assessment Pain Scale: 0-10 Pain Score: 0-No pain  Therapy/Group: Group Therapy  James Obrien A Linnie Delgrande 04/20/2021, 12:45 PM

## 2021-04-19 NOTE — Progress Notes (Addendum)
PROGRESS NOTE   Subjective/Complaints: Patient seen sitting up edge of his bed this morning, talking with neuropsychology.  Discussed with patient and neuropsychology.  Patient with questions regarding etiology.  He states he slept fairly overnight due to tempurature control issues in his room and sweating.   ROS: Denies CP, SOB, N/V/D  Objective:   No results found. No results for input(s): WBC, HGB, HCT, PLT in the last 72 hours.  No results for input(s): NA, K, CL, CO2, GLUCOSE, BUN, CREATININE, CALCIUM in the last 72 hours.    Intake/Output Summary (Last 24 hours) at 04/19/2021 1234 Last data filed at 04/19/2021 0900 Gross per 24 hour  Intake 720 ml  Output 1800 ml  Net -1080 ml      Pressure Injury 04/12/21 Heel Right Deep Tissue Pressure Injury - Purple or maroon localized area of discolored intact skin or blood-filled blister due to damage of underlying soft tissue from pressure and/or shear. Konrad Dolores RN  verification (Active)  04/12/21 1045  Location: Heel  Location Orientation: Right  Staging: Deep Tissue Pressure Injury - Purple or maroon localized area of discolored intact skin or blood-filled blister due to damage of underlying soft tissue from pressure and/or shear.  Wound Description (Comments): Konrad Dolores RN  verification  Present on Admission: Yes    Physical Exam: Vital Signs Blood pressure 111/71, pulse 75, temperature 97.7 F (36.5 C), temperature source Oral, resp. rate 18, height 5\' 11"  (1.803 m), weight 71.5 kg, SpO2 97 %. Constitutional: No distress . Vital signs reviewed. HENT: Normocephalic.  Atraumatic. Eyes: EOMI. No discharge. Cardiovascular: No JVD.  RRR. Respiratory: Normal effort.  No stridor.  Bilateral clear to auscultation. GI: Non-distended.  BS +. Skin: Warm and dry.  Right heel DTI Psych: Normal mood.  Normal behavior.  Verbose. Musc: No edema in extremities.  No  tenderness in extremities. Neuro: Alert Decreased LT/pain sense aroung T5-6 with legs more affected than trunk, persistent Motor: LLE 1+/5 HF, KE, 1/5 ADF.  RLE: Flexion, knee extension 1+/5, ankle dorsiflexion 0/5  Assessment/Plan: 1. Functional deficits which require 3+ hours per day of interdisciplinary therapy in a comprehensive inpatient rehab setting. Physiatrist is providing close team supervision and 24 hour management of active medical problems listed below. Physiatrist and rehab team continue to assess barriers to discharge/monitor patient progress toward functional and medical goals  Care Tool:  Bathing    Body parts bathed by patient: Right arm, Left arm, Chest, Abdomen, Face, Front perineal area, Buttocks, Right upper leg, Left upper leg, Right lower leg, Left lower leg   Body parts bathed by helper: Right lower leg, Left lower leg Body parts n/a: Left lower leg, Right lower leg, Left upper leg, Right upper leg, Buttocks, Front perineal area   Bathing assist Assist Level: Contact Guard/Touching assist     Upper Body Dressing/Undressing Upper body dressing   What is the patient wearing?: Pull over shirt    Upper body assist Assist Level: Set up assist    Lower Body Dressing/Undressing Lower body dressing      What is the patient wearing?: Pants, Incontinence brief     Lower body assist Assist for lower body dressing:  Minimal Assistance - Patient > 75%     Toileting Toileting Toileting Activity did not occur Press photographer and hygiene only): N/A (no void or bm)  Toileting assist Assist for toileting: Maximal Assistance - Patient 25 - 49%     Transfers Chair/bed transfer  Transfers assist     Chair/bed transfer assist level: Supervision/Verbal cueing Chair/bed transfer assistive device: Armrests   Locomotion Ambulation   Ambulation assist   Ambulation activity did not occur: Safety/medical concerns          Walk 10 feet  activity   Assist  Walk 10 feet activity did not occur: Safety/medical concerns        Walk 50 feet activity   Assist Walk 50 feet with 2 turns activity did not occur: Safety/medical concerns         Walk 150 feet activity   Assist Walk 150 feet activity did not occur: Safety/medical concerns         Walk 10 feet on uneven surface  activity   Assist Walk 10 feet on uneven surfaces activity did not occur: Safety/medical concerns         Wheelchair     Assist Is the patient using a wheelchair?: Yes Type of Wheelchair: Manual    Wheelchair assist level: Supervision/Verbal cueing Max wheelchair distance: >547ft    Wheelchair 50 feet with 2 turns activity    Assist        Assist Level: Supervision/Verbal cueing   Wheelchair 150 feet activity     Assist      Assist Level: Supervision/Verbal cueing   Blood pressure 111/71, pulse 75, temperature 97.7 F (36.5 C), temperature source Oral, resp. rate 18, height 5\' 11"  (1.803 m), weight 71.5 kg, SpO2 97 %. Medical Problem List and Plan: 1.   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  Antibiotic course completed..  Follow-up with ID as outpatient with immunologic work-up and viral serology  Continue CIR 2.  Antithrombotics: -DVT/anticoagulation:  Pharmaceutical: Lovenox.   10/11- vascular study (-)  10/12- will need Lovenox for 3 months total due to risk of DVT being so high.              -antiplatelet therapy: N/A 3. Pain Management: Tylenol/Advil as needed  Lidoderm patches- 3 of them 8pm to 8am- if not enough, might try tylenol #3.   Controlled with meds on 10/21 4. Mood: Provide emotional support             -antipsychotic agents: N/A 5. Neuropsych: This patient is capable of making decisions on his own behalf.  Appreciate neuropsych 6. Skin/Wound Care: Routine skin checks             -reiterated to pt that he needs to wear PRAFO's for heel protection and  to stretch heel cords  Discussed with nursing size/fitting 7. Fluids/Electrolytes/Nutrition: Routine in and outs 8.  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  Keppra 500 mg twice daily 9.  Sinus bradycardia.  Echocardiogram unremarkable.  No further work-up indicated per cardiology services  Controlled on 10/21 10.  Neurogenic bladder/Urinary retention.  Failed voiding trial.               cath if needed- if volumes >300cc  Flomax increased on 10/15, monitor blood pressures  Flomax increased Started on Urecholine 10 mg TID on 10/20, will likely be increased 11. Neurogenic bowel: begin scheduled senokot-s at HS.             -miralax  daily             -consider bowel program depending upon continence and regularity             -pt reports no sense of bowel emptying currently. 12. At level SCI pain  See #3 13.  Mild hyponatremia: Resolved 14.  Elevated BUN: Resolved 15. R heel DTI and blood blister- local wound care and wear PRAFOs when in bed esp to prevent DTI from opening possibly.  16. Developing spasticity-   Stable 17. Sweating above level of lesion  Likely due to temp dysregulation,, however patient also states that his significant other is sweating at night as well-he believes it is the thermostat in his room 18.  Sleep disturbance  As needed melatonin added per patient request  LOS: 13 days A FACE TO FACE EVALUATION WAS PERFORMED  Kathrynne Kulinski Karis Juba 04/19/2021, 12:34 PM

## 2021-04-20 LAB — URINALYSIS, MICROSCOPIC (REFLEX)
Squamous Epithelial / HPF: NONE SEEN (ref 0–5)
WBC, UA: >50 WBC/hpf (ref 0–5)

## 2021-04-20 LAB — URINALYSIS, ROUTINE W REFLEX MICROSCOPIC
Bilirubin Urine: NEGATIVE
Glucose, UA: NEGATIVE mg/dL
Ketones, ur: NEGATIVE mg/dL
Nitrite: POSITIVE — AB
Protein, ur: NEGATIVE mg/dL
Specific Gravity, Urine: >1.03 — ABNORMAL HIGH (ref 1.005–1.030)
pH: 6 (ref 5.0–8.0)

## 2021-04-20 NOTE — Progress Notes (Signed)
Completed teaching for wife and clean technique straight cath for at home care. Wife successfully prepared for and straight cathed patient for 300 mL. Patient tolerated procedure well. Vic Ripper

## 2021-04-20 NOTE — Progress Notes (Signed)
PROGRESS NOTE   Subjective/Complaints: Discussed that he is waiting for wheelchair that is better fitted for him He is concerned with his inability to void- currently just dribbling  ROS: Denies CP, SOB, N/V/D, +urinary retention  Objective:   No results found. No results for input(s): WBC, HGB, HCT, PLT in the last 72 hours.  No results for input(s): NA, K, CL, CO2, GLUCOSE, BUN, CREATININE, CALCIUM in the last 72 hours.    Intake/Output Summary (Last 24 hours) at 04/20/2021 1624 Last data filed at 04/20/2021 1500 Gross per 24 hour  Intake --  Output 1650 ml  Net -1650 ml     Pressure Injury 04/12/21 Heel Right Deep Tissue Pressure Injury - Purple or maroon localized area of discolored intact skin or blood-filled blister due to damage of underlying soft tissue from pressure and/or shear. Konrad Dolores RN  verification (Active)  04/12/21 1045  Location: Heel  Location Orientation: Right  Staging: Deep Tissue Pressure Injury - Purple or maroon localized area of discolored intact skin or blood-filled blister due to damage of underlying soft tissue from pressure and/or shear.  Wound Description (Comments): Konrad Dolores RN  verification  Present on Admission: Yes    Physical Exam: Vital Signs Blood pressure (!) 110/56, pulse 62, temperature 97.7 F (36.5 C), temperature source Oral, resp. rate 16, height 5\' 11"  (1.803 m), weight 71.5 kg, SpO2 98 %. Gen: no distress, normal appearing HEENT: oral mucosa pink and moist, NCAT Cardio: Reg rate Chest: normal effort, normal rate of breathing GI: Non-distended.  BS +. Skin: Warm and dry.  Right heel DTI Psych: Normal mood.  Normal behavior.  Verbose. Musc: No edema in extremities.  No tenderness in extremities. Neuro: Alert Decreased LT/pain sense aroung T5-6 with legs more affected than trunk, persistent Motor: LLE 1+/5 HF, KE, 1/5 ADF.  RLE: Flexion, knee extension  1+/5, ankle dorsiflexion 0/5  Assessment/Plan: 1. Functional deficits which require 3+ hours per day of interdisciplinary therapy in a comprehensive inpatient rehab setting. Physiatrist is providing close team supervision and 24 hour management of active medical problems listed below. Physiatrist and rehab team continue to assess barriers to discharge/monitor patient progress toward functional and medical goals  Care Tool:  Bathing    Body parts bathed by patient: Right arm, Left arm, Chest, Abdomen, Face, Front perineal area, Buttocks, Right upper leg, Left upper leg, Right lower leg, Left lower leg   Body parts bathed by helper: Right lower leg, Left lower leg Body parts n/a: Left lower leg, Right lower leg, Left upper leg, Right upper leg, Buttocks, Front perineal area   Bathing assist Assist Level: Contact Guard/Touching assist     Upper Body Dressing/Undressing Upper body dressing   What is the patient wearing?: Pull over shirt    Upper body assist Assist Level: Set up assist    Lower Body Dressing/Undressing Lower body dressing      What is the patient wearing?: Pants, Incontinence brief     Lower body assist Assist for lower body dressing: Minimal Assistance - Patient > 75%     Toileting Toileting Toileting Activity did not occur (Clothing management and hygiene only): N/A (no void or bm)  Toileting assist Assist for toileting: Maximal Assistance - Patient 25 - 49%     Transfers Chair/bed transfer  Transfers assist     Chair/bed transfer assist level: Supervision/Verbal cueing Chair/bed transfer assistive device: Armrests   Locomotion Ambulation   Ambulation assist   Ambulation activity did not occur: Safety/medical concerns          Walk 10 feet activity   Assist  Walk 10 feet activity did not occur: Safety/medical concerns        Walk 50 feet activity   Assist Walk 50 feet with 2 turns activity did not occur: Safety/medical  concerns         Walk 150 feet activity   Assist Walk 150 feet activity did not occur: Safety/medical concerns         Walk 10 feet on uneven surface  activity   Assist Walk 10 feet on uneven surfaces activity did not occur: Safety/medical concerns         Wheelchair     Assist Is the patient using a wheelchair?: Yes Type of Wheelchair: Manual    Wheelchair assist level: Supervision/Verbal cueing Max wheelchair distance: >530ft    Wheelchair 50 feet with 2 turns activity    Assist        Assist Level: Supervision/Verbal cueing   Wheelchair 150 feet activity     Assist      Assist Level: Supervision/Verbal cueing   Blood pressure (!) 110/56, pulse 62, temperature 97.7 F (36.5 C), temperature source Oral, resp. rate 16, height 5\' 11"  (1.803 m), weight 71.5 kg, SpO2 98 %. Medical Problem List and Plan: 1.   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  Antibiotic course completed..  Follow-up with ID as outpatient with immunologic work-up and viral serology  Continue CIR 2.  Antithrombotics: -DVT/anticoagulation:  Pharmaceutical: Lovenox.   10/11- vascular study (-)  10/12- will need Lovenox for 3 months total due to risk of DVT being so high.              -antiplatelet therapy: N/A 3. Pain Management: Tylenol/Advil as needed  Lidoderm patches- 3 of them 8pm to 8am- if not enough, might try tylenol #3.   Controlled with meds on 10/21 4. Mood: Provide emotional support             -antipsychotic agents: N/A 5. Neuropsych: This patient is capable of making decisions on his own behalf.  Appreciate neuropsych 6. Skin/Wound Care: Routine skin checks             -reiterated to pt that he needs to wear PRAFO's for heel protection and to stretch heel cords  Discussed with nursing size/fitting 7. Fluids/Electrolytes/Nutrition: Routine in and outs 8.  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  Keppra 500 mg twice  daily 9.  Sinus bradycardia.  Echocardiogram unremarkable.  No further work-up indicated per cardiology services  Controlled on 10/21 10.  Neurogenic bladder/Urinary retention.  Failed voiding trial.               cath if needed- if volumes >300cc  Flomax increased on 10/15, monitor blood pressures  Flomax increased Started on Urecholine 10 mg TID on 10/20, will likely be increased UA/UC ordered 11. Neurogenic bowel: begin scheduled senokot-s at HS.             -continue miralax daily             -consider bowel program depending upon continence and regularity             -  pt reports no sense of bowel emptying currently. 12. At level SCI pain  See #3 13.  Mild hyponatremia: Resolved 14.  Elevated BUN: Resolved 15. R heel DTI and blood blister- local wound care and wear PRAFOs when in bed esp to prevent DTI from opening possibly.  16. Developing spasticity-   Stable 17. Sweating above level of lesion  Likely due to temp dysregulation, however patient also states that his significant other is sweating at night as well-he believes it is the thermostat in his room  Asked  Lurena Joiner RN if she can find a fan for him as he uses athome.  18.  Sleep disturbance  As needed melatonin added per patient request  LOS: 14 days A FACE TO FACE EVALUATION WAS PERFORMED  James Obrien 04/20/2021, 4:24 PM

## 2021-04-21 MED ORDER — HYDROCORTISONE 1 % EX CREA
TOPICAL_CREAM | Freq: Two times a day (BID) | CUTANEOUS | Status: DC
  Filled 2021-04-21: qty 28

## 2021-04-21 NOTE — Progress Notes (Signed)
Occupational Therapy Session Note  Patient Details  Name: James Obrien MRN: [id number] Date of Birth: September 05, 1985  Today's Date: 04/22/2021 OT Individual Time: 2919-1660 OT Individual Time Calculation (min): 71 min   Short Term Goals: Week 2:  OT Short Term Goal 1 (Week 2): Pt will complete bathing shower level MIN A with AE PRN OT Short Term Goal 2 (Week 2): Pt will perform BSC/toilet transfers CGA with no more than occasional cues for form OT Short Term Goal 3 (Week 2): Pt will perform LB dressing bed level Min A and adapative equipement PRN OT Short Term Goal 4 (Week 2): Pt will consistently perform squat pivot transfers CGA to various surfaces in prep for ADL    Skilled Therapeutic Interventions/Progress Updates:    Pt greeted in the w/c with no c/o pain. Started with d/c planning, pt still adamant about returning to his camper vs staying with his parents. He drew a picture of the bathroom and explained how he was planning to transfer on/off of the toilet and then onto the ledge of the tub shower. Pt plans to stand and side-step using countertop and bathroom furniture in camper. Discussed that this is not advised due to high fall risk. Pt resistant to purchasing a TTB, wants to instead sit on the ledge of the shower or use a stool. He was agreeable to having his wife present with him during all bathroom mobility for safety. Pt ultimately refusing safety and transfer education provided by OT. Transitioned to LE NMR to improve sit<stand transfer safety. Worked on sit<stands, Lt>Rt weight shifting, mini squats, and forward/backward stepping with OT guarding his Rt knee. Pt requiring Max A at times to prevent knee collapse. OT showed his wife Sharlynn Oliphant how to guard pts knee during forward/backward stepping with Jess having hands on practice during tx. Pt reports that Sharlynn Oliphant is independent with helping him cath at this point and that pt can independently perform his own bowel program. Pt remained  sitting up in the w/c, all needs within reach.   Pt also c/o increased Rt LE swelling. OT advised him to wear his compression socks, which is an MD order, however pt refuses to wear them for comfort reasons  Therapy Documentation Precautions:  Precautions Precautions: Fall Precaution Comments: paraparesis; check BP Other Brace: TED hose Restrictions Weight Bearing Restrictions: No  ADL: ADL Eating: Set up Where Assessed-Eating: Bed level Grooming: Setup Where Assessed-Grooming: Edge of bed Upper Body Bathing: Setup Where Assessed-Upper Body Bathing: Edge of bed Lower Body Bathing: Maximal assistance Where Assessed-Lower Body Bathing: Bed level Upper Body Dressing: Minimal assistance Where Assessed-Upper Body Dressing: Edge of bed Lower Body Dressing: Maximal assistance Where Assessed-Lower Body Dressing: Bed level Toileting: Not assessed Where Assessed-Toileting: Bed level Toilet Transfer: Not assessed ADL Comments: requires max A to maintain sitting edge of bed for grooming and upper body adl   Therapy/Group: Individual Therapy  Kaitlin Ardito A Ahnika Hannibal 04/22/2021, 12:31 PM

## 2021-04-21 NOTE — Progress Notes (Signed)
Physical Therapy Session Note  Patient Details  Name: James Obrien MRN: [id number] Date of Birth: 1986/04/12  Today's Date: 04/21/2021 PT Individual Time: 0730-0813 PT Individual Time Calculation (min): 43 min   Short Term Goals: Week 2:  PT Short Term Goal 1 (Week 2): Pt will stand with mod A to RW PT Short Term Goal 2 (Week 2): Pt will initiate gait training PT Short Term Goal 3 (Week 2): Pt will perform dynamic reaching tasks outside BOS without LOB x 5 min  Skilled Therapeutic Interventions/Progress Updates:    Pt presents in bed and asleep but arousable. Agreeable to session. Discussed c/o itching on back and will notify RN as well. Bed mobility mod I using bed rails for support. Pt managing BLE independently to prepare for and set up for transfer including locking brakes independently. Transfer with supervision for safety. Discussed d/c planning and overall goals and progress. Mod I for w/c mobility on unit. Supervision transfer onto Nustep with focus on BLE NMR for muscle activation and reciprocal movement retraining x 5 min with assist and tapping to RLE to increase activation on level 1 (pt able to do LLE without aid). Sit <> stand retraining for NMR for BLE strengthening and postural control retrainng with weigthshifting in standing about 30 sec each trial x 3 reps total. End of session left up with breakfast tray.   Therapy Documentation Precautions:  Precautions Precautions: Fall Precaution Comments: paraparesis; check BP Other Brace: TED hose Restrictions Weight Bearing Restrictions: No  Pain: Unrated but just complains of general soreness.      Therapy/Group: Individual Therapy  Karolee Stamps Darrol Poke, PT, DPT, CBIS  04/21/2021, 8:18 AM

## 2021-04-21 NOTE — Progress Notes (Signed)
Occupational Therapy Session Note  Patient Details  Name: James Obrien MRN: [id number] Date of Birth: 1986/03/15  Today's Date: 04/21/2021 OT Individual Time: 0930-1015 OT Individual Time Calculation (min): 45 min    Short Term Goals: Week 2:  OT Short Term Goal 1 (Week 2): Pt will complete bathing shower level MIN A with AE PRN OT Short Term Goal 2 (Week 2): Pt will perform BSC/toilet transfers CGA with no more than occasional cues for form OT Short Term Goal 3 (Week 2): Pt will perform LB dressing bed level Min A and adapative equipement PRN OT Short Term Goal 4 (Week 2): Pt will consistently perform squat pivot transfers CGA to various surfaces in prep for ADL   Skilled Therapeutic Interventions/Progress Updates:    Pt greeted at time of session sitting up in wheelchair ready for OT session, no pain but does have back itching (no redness and pt states nursing aware). ADL needs already met. Self propel room > ground floor cafeteria with Mod I and managed own coffee station as well as money management to pay for items. Self propel same manner up to 5th floor N tower and spent time with pt for SCI education, pressure relief, discussion bowel program/cathing for B&B, DC planning, DME needs (declined DABSC), and modifications for set up of camper as pt states he will now be going to stay in his camper with wife and kids instead of moms house. Note pt somewhat receptive to education but still states he will "figure it out" regarding set up. Pt back in room, wife present so no alarm on needed (checked off on transfers), and call bell in reach all needs met.     Therapy Documentation Precautions:  Precautions Precautions: Fall Precaution Comments: paraparesis; check BP Other Brace: TED hose Restrictions Weight Bearing Restrictions: No    Therapy/Group: Individual Therapy  [name] 04/21/2021, 7:13 AM

## 2021-04-21 NOTE — Progress Notes (Signed)
Bladder scanned this morning. 521 mls urine recorded. Straight Cath with sterile technique yielded 650 mls of urine with characteristics yellow, somewhat clear, not cloudy and not foul smelling. Denies pain. Remains socially appropriate. Safety maintained at all times

## 2021-04-22 LAB — CBC WITH DIFFERENTIAL/PLATELET
Abs Immature Granulocytes: 0.01 10*3/uL (ref 0.00–0.07)
Basophils Absolute: 0 10*3/uL (ref 0.0–0.1)
Basophils Relative: 1 %
Eosinophils Absolute: 0.1 10*3/uL (ref 0.0–0.5)
Eosinophils Relative: 3 %
HCT: 40.5 % (ref 39.0–52.0)
Hemoglobin: 13.2 g/dL (ref 13.0–17.0)
Immature Granulocytes: 0 %
Lymphocytes Relative: 42 %
Lymphs Abs: 1.8 10*3/uL (ref 0.7–4.0)
MCH: 30.7 pg (ref 26.0–34.0)
MCHC: 32.6 g/dL (ref 30.0–36.0)
MCV: 94.2 fL (ref 80.0–100.0)
Monocytes Absolute: 0.4 10*3/uL (ref 0.1–1.0)
Monocytes Relative: 10 %
Neutro Abs: 1.9 10*3/uL (ref 1.7–7.7)
Neutrophils Relative %: 44 %
Platelets: 189 10*3/uL (ref 150–400)
RBC: 4.3 MIL/uL (ref 4.22–5.81)
RDW: 14.6 % (ref 11.5–15.5)
WBC: 4.2 10*3/uL (ref 4.0–10.5)
nRBC: 0 % (ref 0.0–0.2)

## 2021-04-22 MED ORDER — HYDROCERIN EX CREA
TOPICAL_CREAM | Freq: Two times a day (BID) | CUTANEOUS | Status: DC
  Filled 2021-04-22: qty 113

## 2021-04-22 MED ORDER — CEPHALEXIN 250 MG PO CAPS
500.0000 mg | ORAL_CAPSULE | Freq: Four times a day (QID) | ORAL | Status: DC
  Administered 2021-04-22 – 2021-04-25 (×13): 500 mg via ORAL
  Filled 2021-04-22 (×13): qty 2

## 2021-04-22 NOTE — Discharge Summary (Signed)
Physician Discharge Summary  Patient ID: James Obrien MRN: [id number] DOB/AGE: 1985/08/18 35 y.o.  Admit date: 04/06/2021 Discharge date: 04/25/2021  Discharge Diagnoses:  Principal Problem:   Myelitis [facility]) Active Problems:   Encephalitis   Elevated BUN   Hyponatremia   Neurogenic bowel   Neurogenic bladder   Deep tissue injury   Neuropathic pain   Sleep disturbance DVT prophylaxis Tobacco use E. coli UTI   Discharged Condition: Stable  Significant Diagnostic Studies: DG Chest 1 View  Addendum Date: 03/28/2021   ADDENDUM REPORT: 03/28/2021 15:57 Electronically Signed   By: Jasmine Pang M.D.   On: 03/28/2021 15:57   Result Date: 03/28/2021 CLINICAL DATA:  Seizure and fell out of chair EXAM: CHEST  1 VIEW COMPARISON:  None. FINDINGS: No focal opacity or pleural effusion. Borderline cardiomegaly. No pneumothorax. IMPRESSION: Borderline cardiomegaly without edema or focal airspace disease. Electronically Signed: By: Jasmine Pang M.D. On: 03/28/2021 15:55   CT Head Wo Contrast  Result Date: 03/28/2021 CLINICAL DATA:  Seizure. EXAM: CT HEAD WITHOUT CONTRAST TECHNIQUE: Contiguous axial images were obtained from the base of the skull through the vertex without intravenous contrast. COMPARISON:  None. FINDINGS: Brain: No evidence of acute infarction, hemorrhage, hydrocephalus, extra-axial collection or mass lesion/mass effect. Vascular: No hyperdense vessel or unexpected calcification. Skull: Normal. Negative for fracture or focal lesion. Sinuses/Orbits: Air-fluid level noted in the left maxillary sinus. Other: None. IMPRESSION: 1. No acute intracranial abnormalities. 2. Air-fluid level within the left maxillary sinus. Electronically Signed   By: Signa Kell M.D.   On: 03/28/2021 15:49   MR BRAIN WO CONTRAST  Result Date: 03/28/2021 CLINICAL DATA:  Seizure EXAM: MRI HEAD WITHOUT CONTRAST TECHNIQUE: Multiplanar, multiecho pulse sequences of the brain and surrounding structures  were obtained without intravenous contrast. COMPARISON:  None. FINDINGS: Brain: There is no acute hemorrhage or acute infarct. There is an area of irregular hyperintense T2-weighted signal that involves the precentral and postcentral gyri. There are other scattered foci of hyperintense T2-weighted signal, many of which have an abnormal, rounded morphology. Vascular: Major flow voids are preserved. Skull and upper cervical spine: Normal calvarium and skull base. Visualized upper cervical spine and soft tissues are normal. Sinuses/Orbits:Mucosal thickening in the maxillary sinuses. No mastoid or middle ear effusion. Normal orbits. IMPRESSION: 1. Multifocal abnormal hyperintense T2-weighted signal within both hemispheres, but greatest at the right pre and postcentral gyri. The appearance is concerning for acute demyelination. Lymphoma is a secondary possibility. Postcontrast imaging is recommended as an adjunct to this study, for further evaluation. Electronically Signed   By: Deatra Robinson M.D.   On: 03/28/2021 22:54   MR BRAIN W CONTRAST  Result Date: 03/29/2021 CLINICAL DATA:  Brain mass or lesion EXAM: MRI HEAD WITH CONTRAST TECHNIQUE: Multiplanar, multiecho pulse sequences of the brain and surrounding structures were obtained with intravenous contrast. CONTRAST:  24mL GADAVIST GADOBUTROL 1 MMOL/ML IV SOLN COMPARISON:  Noncontrast brain MRI from earlier today FINDINGS: Motion degraded study. Some of the patient's lesions show faint amorphous enhancement, including in the right frontal parietal cortex and juxtacortical region, the left occipital cortex, in indistinctly in the pons. Major vessels are enhancing. 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 Negative orbits and skull. There is chart history of lesions involving the genitals. Neuro Bechet's is a leading consideration especially given the pontine involvement.  Other inflammatory processes such as ADEM or sarcoid are considered. An infiltrating neoplasm such as  lymphoma is a consideration, but usually much more avidly enhancing. Need follow-up. IMPRESSION: Faint enhancement is associated with the T2 hyperintense lesions, an inflammatory/demyelinating process is again favored. Given pontine involvement and chart history of genitourinary lesions, specifically consider Bechet's. Further discussion above. Electronically Signed   By: Tiburcio Pea M.D.   On: 03/29/2021 04:28   MR THORACIC SPINE WO CONTRAST  Result Date: 03/30/2021 CLINICAL DATA:  Recent LP, evaluate for epidural collection. EXAM: MRI THORACIC AND LUMBAR SPINE WITHOUT CONTRAST TECHNIQUE: Multiplanar and multiecho pulse sequences of the thoracic and lumbar spine were obtained without intravenous contrast. COMPARISON:  None. FINDINGS: MRI THORACIC SPINE FINDINGS Alignment:  Normal Vertebrae: No fracture, evidence of discitis, or bone lesion. No evidence of bone infarct Cord: Diffuse T2 hyperintensity in the cord, primarily central with mild swollen appearance. There is likely cervical involvement as well, but partially covered. Paraspinal and other soft tissues: No evidence of perispinal mass or inflammation. Disc levels: Small disc protrusions at T7-8 and T8-9 which contact but do not compress the cord. MRI LUMBAR SPINE FINDINGS Segmentation:  5 lumbar type vertebrae Alignment:  Normal Vertebrae:  No fracture, evidence of discitis, or bone lesion. Conus medullaris and cauda equina: Conus extends to the L1 level. Mild T2 hyperintensity and swelling at the level of the conus. No cauda equina thickening, nodularity, or distortion. Paraspinal and other soft tissues: Interspinous T2 hyperintensity at L4-5 which is likely from the lumbar puncture. No collection. Disc levels: Well preserved disc height and hydration. Negative facets. No neural impingement. IMPRESSION: 1. Longitudinally extensive cord signal  abnormality/myelitis. 2. No hematoma or other complication related to lumbar puncture. Electronically Signed   By: Tiburcio Pea M.D.   On: 03/30/2021 11:11   MR LUMBAR SPINE WO CONTRAST  Result Date: 03/30/2021 CLINICAL DATA:  Recent LP, evaluate for epidural collection. EXAM: MRI THORACIC AND LUMBAR SPINE WITHOUT CONTRAST TECHNIQUE: Multiplanar and multiecho pulse sequences of the thoracic and lumbar spine were obtained without intravenous contrast. COMPARISON:  None. FINDINGS: MRI THORACIC SPINE FINDINGS Alignment:  Normal Vertebrae: No fracture, evidence of discitis, or bone lesion. No evidence of bone infarct Cord: Diffuse T2 hyperintensity in the cord, primarily central with mild swollen appearance. There is likely cervical involvement as well, but partially covered. Paraspinal and other soft tissues: No evidence of perispinal mass or inflammation. Disc levels: Small disc protrusions at T7-8 and T8-9 which contact but do not compress the cord. MRI LUMBAR SPINE FINDINGS Segmentation:  5 lumbar type vertebrae Alignment:  Normal Vertebrae:  No fracture, evidence of discitis, or bone lesion. Conus medullaris and cauda equina: Conus extends to the L1 level. Mild T2 hyperintensity and swelling at the level of the conus. No cauda equina thickening, nodularity, or distortion. Paraspinal and other soft tissues: Interspinous T2 hyperintensity at L4-5 which is likely from the lumbar puncture. No collection. Disc levels: Well preserved disc height and hydration. Negative facets. No neural impingement. IMPRESSION: 1. Longitudinally extensive cord signal abnormality/myelitis. 2. No hematoma or other complication related to lumbar puncture. Electronically Signed   By: Tiburcio Pea M.D.   On: 03/30/2021 11:11   CT Abdomen Pelvis W Contrast  Result Date: 03/28/2021 CLINICAL DATA:  Left groin pain. EXAM: CT ABDOMEN AND PELVIS WITH CONTRAST TECHNIQUE: Multidetector CT imaging of the abdomen and pelvis was performed  using the standard protocol following bolus administration of intravenous contrast. CONTRAST:  OMNIPAQUE IOHEXOL 300 MG/ML  SOLN COMPARISON:  CT scan 03/25/2021 FINDINGS: Lower chest: The lung bases are clear  of acute process. No pleural effusion or pulmonary lesions. The heart is normal in size. No pericardial effusion. The distal esophagus and aorta are unremarkable. Hepatobiliary: No hepatic lesions or intrahepatic biliary dilatation. The gallbladder is contracted. No common bile duct dilatation. Pancreas: No mass, inflammation or ductal dilatation. Spleen: Normal size.  No focal lesions. Adrenals/Urinary Tract: Adrenal glands and kidneys are. The bladder is normal. Stomach/Bowel: The stomach, duodenum, small bowel and colon are unremarkable. The terminal ileum and appendix are normal. Vascular/Lymphatic: The aorta is normal in caliber. No dissection. The branch vessels are patent. The major venous structures are patent. No mesenteric or retroperitoneal mass or adenopathy. Small scattered lymph nodes are noted. Reproductive: The prostate gland and seminal vesicles are unremarkable. Other: Similar to the prior CT scan there are enlarged pelvic and inguinal lymph nodes. Some improvement when compared to the prior study suggesting these are inflammatory/reactive. The left groin inflammatory process/cellulitis is much improved. No abscess. Musculoskeletal: No significant bony findings. IMPRESSION: 1. No acute abdominal/pelvic findings. 2. Improved/largely resolved left groin inflammatory process/cellulitis. 3. Persistent but slightly improved pelvic and inguinal lymphadenopathy, likely inflammatory/reactive. Electronically Signed   By: Rudie Meyer M.D.   On: 03/28/2021 16:00   CT Abdomen Pelvis W Contrast  Result Date: 03/25/2021 CLINICAL DATA:  Inguinal swelling, abscess EXAM: CT ABDOMEN AND PELVIS WITH CONTRAST TECHNIQUE: Multidetector CT imaging of the abdomen and pelvis was performed using the  standard protocol following bolus administration of intravenous contrast. CONTRAST:  85mL OMNIPAQUE IOHEXOL 350 MG/ML SOLN COMPARISON:  None. FINDINGS: Lower chest: No acute pleural or parenchymal lung disease. Hepatobiliary: No focal liver abnormality is seen. No gallstones, gallbladder wall thickening, or biliary dilatation. Pancreas: Unremarkable. No pancreatic ductal dilatation or surrounding inflammatory changes. Spleen: Normal in size without focal abnormality. Adrenals/Urinary Tract: The kidneys enhance normally and symmetrically. No urinary tract calculi or obstructive uropathy. The bladder is decompressed, which limits evaluation. The adrenals are unremarkable. Stomach/Bowel: No bowel obstruction or ileus. Normal appendix right lower quadrant. No bowel wall thickening or inflammatory change. Vascular/Lymphatic: No significant vascular findings. Lymphadenopathy is seen throughout the pelvis. Largest lymph node in the right external iliac chain measures 16 mm in short axis reference image 67/2. There are numerous enlarged bilateral inguinal lymph nodes, largest on the right measuring up to 13 mm in short axis reference image 76/2. These are likely reactive. Reproductive: Prostate is unremarkable. Other: There is subcutaneous fat stranding within the left lower anterior abdominal wall extending into the left inguinal region, consistent with cellulitis. There is no underlying fluid collection or abscess. No free intraperitoneal fluid or free gas. No abdominal wall hernia. Musculoskeletal: No acute or destructive bony lesions. Reconstructed images demonstrate no additional findings. IMPRESSION: 1. Subcutaneous fat stranding left lower quadrant anterior abdominal wall and left inguinal region, consistent with cellulitis. No fluid collection or abscess. 2. Reactive lymphadenopathy within the pelvis and bilateral inguinal regions as above. Electronically Signed   By: Sharlet Salina M.D.   On: 03/25/2021 19:05    DG CHEST PORT 1 VIEW  Result Date: 04/01/2021 CLINICAL DATA:  Left-sided chest pain EXAM: PORTABLE CHEST 1 VIEW COMPARISON:  03/28/2021 FINDINGS: The heart size and mediastinal contours are within normal limits. No focal airspace consolidation, pleural effusion, or pneumothorax. The visualized skeletal structures are unremarkable. IMPRESSION: No active disease. Electronically Signed   By: Duanne Guess D.O.   On: 04/01/2021 20:14   EEG adult  Result Date: 03/29/2021 Charlsie Quest, MD     03/29/2021  2:21  PM Patient Name: James Obrien MRN: [id number] Epilepsy Attending: Charlsie Quest Referring Physician/Provider: Leda Gauze, NP Date: 03/29/2021 Duration: 22.38 mins Patient history: 35 y.o. male with PMH significant for everyday smoker who presents with multiple focal seizures with retained awareness and 2 focal seizures with secondary generalization. EEG to evaluate for seizure. Level of alertness: Awake, asleep AEDs during EEG study: LEV Technical aspects: This EEG study was done with scalp electrodes positioned according to the 10-20 International system of electrode placement. Electrical activity was acquired at a sampling rate of 500Hz  and reviewed with a high frequency filter of 70Hz  and a low frequency filter of 1Hz . EEG data were recorded continuously and digitally stored. Description: The posterior dominant rhythm consists of 9 Hz activity of moderate voltage (25-35 uV) seen predominantly in posterior head regions, asymmetric ( left<right)  and reactive to eye opening and eye closing.Sleep was characterized by vertex waves, sleep spindles (12 to 14 Hz), maximal frontocentral region. EEG showed intermittent generalized and lateralized left hemisphere 3 to 6 Hz theta-delta slowing.  Hyperventilation and photic stimulation were not performed.   ABNORMALITY - Intermittent slow, generalized and lateralized left hemisphere - Background asymmetry, left<right IMPRESSION: This study  is suggestive of cortical dysfunction arising from left hemisphere, nonspecific etiology but could be secondary to underlying structural abnormality. Additionally there is mild diffuse encephalopathy, nonspecific etiology. No seizures or epileptiform discharges were seen throughout the recording.   ECHOCARDIOGRAM COMPLETE  Result Date: 03/30/2021    ECHOCARDIOGRAM REPORT   Patient Name:   James Obrien Date of Exam: 03/30/2021 Medical Rec #:  05/30/2021           Height:       71.0 in Accession #:    [name]          Weight:       165.0 lb Date of Birth:  12/31/1985            BSA:          1.943 m Patient Age:    35 years            BP:           121/64 mmHg Patient Gender: M                   HR:           79 bpm. Exam Location:  Inpatient Procedure: 2D Echo, Cardiac Doppler and Color Doppler Indications:    Other abnormalities of the heart  History:        Patient has no prior history of Echocardiogram examinations.                 Arrythmias:Bradycardia. Seizure.  Sonographer:    [id number] RDCS (AE) Referring Phys: [phone] THOMAS A KELLY IMPRESSIONS  1. Left ventricular ejection fraction, by estimation, is 55 to 60%. The left ventricle has normal function. The left ventricle has no regional wall motion abnormalities. Left ventricular diastolic parameters were normal.  2. Right ventricular systolic function is normal. The right ventricular size is normal. Tricuspid regurgitation signal is inadequate for assessing PA pressure.  3. There is a trivial pericardial effusion posterior to the left ventricle.  4. The mitral valve is grossly normal. Trivial mitral valve regurgitation.  5. The aortic valve is tricuspid. Aortic valve regurgitation is not visualized. No aortic stenosis is present. Aortic valve mean gradient measures 6.0 mmHg.  6. The inferior vena cava is  normal in size with <50% respiratory variability, suggesting right atrial pressure of 8 mmHg. Comparison(s): No prior  Echocardiogram. FINDINGS  Left Ventricle: Left ventricular ejection fraction, by estimation, is 55 to 60%. The left ventricle has normal function. The left ventricle has no regional wall motion abnormalities. The left ventricular internal cavity size was normal in size. There is  no left ventricular hypertrophy. Left ventricular diastolic parameters were normal. Right Ventricle: The right ventricular size is normal. No increase in right ventricular wall thickness. Right ventricular systolic function is normal. Tricuspid regurgitation signal is inadequate for assessing PA pressure. Left Atrium: Left atrial size was normal in size. Right Atrium: Right atrial size was normal in size. Pericardium: Trivial pericardial effusion is present. The pericardial effusion is posterior to the left ventricle. Mitral Valve: The mitral valve is grossly normal. Trivial mitral valve regurgitation. Tricuspid Valve: The tricuspid valve is grossly normal. Tricuspid valve regurgitation is trivial. Aortic Valve: The aortic valve is tricuspid. Aortic valve regurgitation is not visualized. No aortic stenosis is present. Aortic valve mean gradient measures 6.0 mmHg. Aortic valve peak gradient measures 14.1 mmHg. Aortic valve area, by VTI measures 3.00  cm. Pulmonic Valve: The pulmonic valve was grossly normal. Pulmonic valve regurgitation is trivial. Aorta: The aortic root is normal in size and structure. Venous: The inferior vena cava is normal in size with less than 50% respiratory variability, suggesting right atrial pressure of 8 mmHg. IAS/Shunts: No atrial level shunt detected by color flow Doppler.  LEFT VENTRICLE PLAX 2D LVIDd:         4.90 cm  Diastology LVIDs:         3.50 cm  LV e' medial:    17.60 cm/s LV PW:         1.00 cm  LV E/e' medial:  6.3 LV IVS:        0.80 cm  LV e' lateral:   21.90 cm/s LVOT diam:     2.10 cm  LV E/e' lateral: 5.1 LV SV:         94 LV SV Index:   48 LVOT Area:     3.46 cm  RIGHT VENTRICLE              IVC RV Basal diam:  3.20 cm     IVC diam: 2.00 cm RV S prime:     16.20 cm/s TAPSE (M-mode): 4.0 cm LEFT ATRIUM           Index       RIGHT ATRIUM           Index LA diam:      2.50 cm 1.29 cm/m  RA Area:     16.60 cm LA Vol (A2C): 39.9 ml 20.53 ml/m RA Volume:   38.60 ml  19.87 ml/m LA Vol (A4C): 37.2 ml 19.15 ml/m  AORTIC VALVE AV Area (Vmax):    2.86 cm AV Area (Vmean):   3.07 cm AV Area (VTI):     3.00 cm AV Vmax:           188.00 cm/s AV Vmean:          109.000 cm/s AV VTI:            0.312 m AV Peak Grad:      14.1 mmHg AV Mean Grad:      6.0 mmHg LVOT Vmax:         155.00 cm/s LVOT Vmean:        96.600 cm/s LVOT  VTI:          0.270 m LVOT/AV VTI ratio: 0.87  AORTA Ao Root diam: 3.30 cm Ao Asc diam:  2.90 cm MITRAL VALVE MV Area (PHT): 2.76 cm     SHUNTS MV Decel Time: 275 msec     Systemic VTI:  0.27 m MV E velocity: 111.00 cm/s  Systemic Diam: 2.10 cm MV A velocity: 43.30 cm/s MV E/A ratio:  2.56 Nona Dell MD Electronically signed by Nona Dell MD Signature Date/Time: 03/30/2021/2:07:45 PM    Final    VAS [place] LOWER EXTREMITY VENOUS (DVT)  Result Date: 04/08/2021  Lower Venous DVT Study Patient Name:  James Obrien  Date of Exam:   04/07/2021 Medical Rec #: [id number]            Accession #:    [id number] Date of Birth: 16-Apr-1986             Patient Gender: M Patient Age:   9 years Exam Location:  [facility] Procedure:      VAS [place] LOWER EXTREMITY VENOUS (DVT) Referring Phys: Mariam Dollar --------------------------------------------------------------------------------  Other Indications: Encephalitis and myelitis, limited mobility, in inpatient                    rehabiliation. Limitations: Poor ultrasound/tissue interface and restricted mobility. Comparison Study: No prior study Performing Technologist: Gertie Fey MHA, RDMS, RVT, RDCS  Examination Guidelines: A complete evaluation includes B-mode imaging, spectral Doppler, color Doppler, and power Doppler as  needed of all accessible portions of each vessel. Bilateral testing is considered an integral part of a complete examination. Limited examinations for reoccurring indications may be performed as noted. The reflux portion of the exam is performed with the patient in reverse Trendelenburg.  +---------+---------------+---------+-----------+----------+--------------+ RIGHT    CompressibilityPhasicitySpontaneityPropertiesThrombus Aging +---------+---------------+---------+-----------+----------+--------------+ CFV      Full           Yes      Yes                                 +---------+---------------+---------+-----------+----------+--------------+ SFJ      Full                                                        +---------+---------------+---------+-----------+----------+--------------+ FV Prox  Full                                                        +---------+---------------+---------+-----------+----------+--------------+ FV Mid   Full                                                        +---------+---------------+---------+-----------+----------+--------------+ FV DistalFull                                                        +---------+---------------+---------+-----------+----------+--------------+  PFV      Full                                                        +---------+---------------+---------+-----------+----------+--------------+ POP      Full           Yes      Yes                                 +---------+---------------+---------+-----------+----------+--------------+ PTV      Full                                                        +---------+---------------+---------+-----------+----------+--------------+ PERO     Full                                                        +---------+---------------+---------+-----------+----------+--------------+    +---------+---------------+---------+-----------+----------+--------------+ LEFT     CompressibilityPhasicitySpontaneityPropertiesThrombus Aging +---------+---------------+---------+-----------+----------+--------------+ CFV      Full           Yes      Yes                                 +---------+---------------+---------+-----------+----------+--------------+ SFJ      Full                                                        +---------+---------------+---------+-----------+----------+--------------+ FV Prox  Full                                                        +---------+---------------+---------+-----------+----------+--------------+ FV Mid   Full                                                        +---------+---------------+---------+-----------+----------+--------------+ FV DistalFull                                                        +---------+---------------+---------+-----------+----------+--------------+ PFV      Full                                                        +---------+---------------+---------+-----------+----------+--------------+  POP      Full           Yes      Yes                                 +---------+---------------+---------+-----------+----------+--------------+ PTV      Full                                                        +---------+---------------+---------+-----------+----------+--------------+ PERO     Full                                                        +---------+---------------+---------+-----------+----------+--------------+     Summary: BILATERAL: - No evidence of deep vein thrombosis seen in the lower extremities, bilaterally. -No evidence of popliteal cyst, bilaterally.   *See table(s) above for measurements and observations. Electronically signed by Gerarda Fraction on 04/08/2021 at 6:49:56 AM.    Final     Labs:  Basic Metabolic Panel: No results for input(s): NA,  K, CL, CO2, GLUCOSE, BUN, CREATININE, CALCIUM, MG, PHOS in the last 168 hours.  CBC: Recent Labs  Lab 04/22/21 0718  WBC 4.2  NEUTROABS 1.9  HGB 13.2  HCT 40.5  MCV 94.2  PLT 189    CBG: No results for input(s): GLUCAP in the last 168 hours.  Family history.  Positive for hypertension.  Negative for seizure colon cancer or esophageal cancer  Brief HPI:   James Obrien is a 35 y.o. right-handed male with unremarkable past medical history except recent penile and scrotal lesions thought to be due to a spider bite as well as tobacco use on no prescription medications.  Per chart review lives with spouse.  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  Presented 03/28/2021 with tingling and numbness of the left hand which quickly progressed to the lower extremities as well as witnessed generalized tonic-clonic seizure that lasted 2-5 minutes.  Patient was loaded with Keppra.  EEG negative.  Cranial CT scan negative.  MRI showed multifocal abnormal hyperintense T2 weighted signal within both hemispheres but greatest at the right pre and postcentral gyri.  Appearance was concerning for acute demyelination.  MRI thoracic lumbar spine longitudinally extensive cord signal abnormality/myelitis.  No hematoma or other complications.  CT of the chest abdomen pelvis showed subcutaneous fat stranding left lower quadrant anterior abdominal wall and left inguinal region consistent with cellulitis.  No fluid collection or abscesses.  Reactive lymphadenopathy within the pelvis and bilateral inguinal regions.  Admission chemistries unremarkable except creatinine 1.27 lactic acid 4.1 HIV antibody nonreactive, RPR nonreactive, GC/chlamydia probe positive for gonorrhea.  Neurology consulted lumbar puncture showed evidence of pleocytosis and CBC with elevated WBC consistent with myelitis, encephalitis.  Patient was initially placed on empiric acyclovir for possible encephalitis discontinued 10/4  negative HSV.  ID did see the patient and bacterial antibiotics were added after neutrophilic CSF was seen in addition to blood cultures positive for methicillin sensitive Staphylococcus epidermidis.  He was treated with low dose of steroids to help reduce potential spinal cord swelling.  Patient's  antibiotic course completed.  Hospital course did have some bradycardia cardiology service follow-up echocardiogram completed ejection fraction 55 to 60% no wall motion abnormalities no further work-up indicated.  Bouts of urinary retention after failed voiding Foley to place Flomax initiated.  Subtenons Lovenox for DVT prophylaxis.  Therapy evaluations completed due to patient decreased functional mobility was admitted for a comprehensive rehab program.   Hospital Course: James Obrien was admitted to rehab 04/06/2021 for inpatient therapies to consist of PT, ST and OT at least three hours five days a week. Past admission physiatrist, therapy team and rehab RN have worked together to provide customized collaborative inpatient rehab.  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  Antibiotic course completed.  Follow-up infectious disease as outpatient with immunologic work-up and viral serology.  Subcutaneous Lovenox for DVT prophylaxis vascular studies negative plan 3 months total due to high risk of DVT.  Pain managed with use of Lidoderm patch occasional Tylenol 3.  Seizure prophylaxis EEG negative Keppra 500 mg twice daily.  Sinus bradycardia asymptomatic echocardiogram unremarkable cardiology services no further work-up indicated.  Neurogenic bowel bladder failed voiding trial catheterization if needed if volumes greater than 300 cc Flomax increased 10/15 as well as initiated Urecholine.  E. coli UTI completing course of Keflex.  Neurogenic bowel bowel program established.   Blood pressures were monitored on TID basis and soft and monitored     Rehab  course: During patient's stay in rehab weekly team conferences were held to monitor patient's progress, set goals and discuss barriers to discharge. At admission, patient required moderate assist side-lying to sitting total assist stand pivot transfers max assist lateral scoot transfers  Physical exam.  Blood pressure 115/77 pulse 57 temperature 98 respiration 16 oxygen saturation 99% room air Constitutional.  No acute distress HEENT Head.  Normocephalic and atraumatic Eyes.  Pupils round and reactive to light no discharge without nystagmus Neck.  Supple nontender no JVD without thyromegaly Cardiac regular rate rhythm not extra sounds or murmur heard Abdomen.  Soft nontender positive bowel sounds without rebound Respiratory effort normal no respiratory distress without wheeze Skin.  Numerous tattoos Quarter size ulcer left heel Neurologic.  Alert makes eye contact with examiner oriented fair insight and awareness.  Diminished light touch and pain control T5-6.  Sensation more diminished in legs.  Upper extremities 4+/5.  Left lower extremity 1+ Hg, KE, 1/5 APF, 0/5 ADF.  Right lower extremity 0/5 proximal to distal.  No resting tone.  DTRs trace to absent  He/She  has had improvement in activity tolerance, balance, postural control as well as ability to compensate for deficits. He/She has had improvement in functional use RUE/LUE  and RLE/LLE as well as improvement in awareness.  Patient progress wheelchair mobility transfers and nearing modified independent goals in preparation for his discharge.  Repeatedly educated patient on safety.  Squat pivot to wheelchair with distant supervision.  Patient navigated to hospital elevators with other people and outdoor environment with steep inclines with supervision via wheelchair.  Patient can stand and sidestep using countertop and bathroom furniture for transfers.  He was resistant to purchasing a TTB wants to instead sit on the ledge of the shower or use a  stool.  Worked on sit to stand with ADLs requiring max assist at times to prevent knee collapse.  Full family teaching completed plan discharged to home       Disposition: Discharged to home    Diet: Regular  Special Instructions: No driving  smoking or alcohol  Local wound care and wear PRAFO when in bed to maintain skin integrity of heels  Follow-up infectious disease for immunologic and viral serology work-up  Medications at discharge 1.  Tylenol as needed 2.  Urecholine 10 mg p.o. 3 times daily 3.  Dulcolax suppository daily 4.  Keflex 500 mg every 6 hours x3 days and stop 5.  Lovenox 40 mg daily until 06/27/2021 and stop 6.  Lidoderm patch change as directed 7.  Keppra 500 mg p.o. twice daily 8.  Melatonin 3 mg nightly 9.  MiraLAX daily hold for loose stools 10.  Senokot S2 tablets nightly 11.  Flomax 0.8 mg daily  30-35 minutes were spent completing discharge summary and discharge planning  Discharge Instructions     Ambulatory referral to Neurology   Complete by: As directed    An appointment is requested in approximately: 4 weeks myelitis/encephalitis   Ambulatory referral to Physical Medicine Rehab   Complete by: As directed    Moderate complexity follow-up 1 to 2 weeks myelitis/encephalitis        Follow-up Information     Lovorn, Aundra Millet, MD Follow up.   Specialty: Physical Medicine and Rehabilitation Why: Office to call for appointment Contact information: [address] [place] [address] [phone]         Gardiner Barefoot, MD Follow up.   Specialty: Infectious Diseases Why: Call for appointment Contact information: [address] [place] [address] [phone]                 Signed: Charlton Amor 04/24/2021, 2:43 PM

## 2021-04-22 NOTE — Progress Notes (Addendum)
PROGRESS NOTE   Subjective/Complaints:  Pt reports he's dribbling- and has large urge feeling- Also has some tingling/itching mid back down- pins/needles in legs- annoying but doesn't need nerve pain meds.  Has mild UTI based on U/A. Will start Keflex 500 mg q6 hours for now and wait for Cx.   ROS:  Pt denies SOB, abd pain, CP, N/V/C/D, and vision changes   Objective:   No results found. Recent Labs    04/22/21 0718  WBC 4.2  HGB 13.2  HCT 40.5  PLT 189    No results for input(s): NA, K, CL, CO2, GLUCOSE, BUN, CREATININE, CALCIUM in the last 72 hours.    Intake/Output Summary (Last 24 hours) at 04/22/2021 1919 Last data filed at 04/22/2021 1900 Gross per 24 hour  Intake 708 ml  Output 1950 ml  Net -1242 ml     Pressure Injury 04/12/21 Heel Right Deep Tissue Pressure Injury - Purple or maroon localized area of discolored intact skin or blood-filled blister due to damage of underlying soft tissue from pressure and/or shear. Konrad Dolores RN  verification (Active)  04/12/21 1045  Location: Heel  Location Orientation: Right  Staging: Deep Tissue Pressure Injury - Purple or maroon localized area of discolored intact skin or blood-filled blister due to damage of underlying soft tissue from pressure and/or shear.  Wound Description (Comments): Konrad Dolores RN  verification  Present on Admission: Yes    Physical Exam: Vital Signs Blood pressure (!) 141/72, pulse 90, temperature 98.1 F (36.7 C), resp. rate 16, height 5\' 11"  (1.803 m), weight 71.5 kg, SpO2 100 %.   General: awake, alert, appropriate, sitting up in w/c- wife asleep; NAD HENT: conjugate gaze; oropharynx moist CV: regular rate; no JVD Pulmonary: CTA B/L; no W/R/R- good air movement GI: soft, NT, ND, (+)BS Psychiatric: appropriate- more talkative Neurological: Ox3  Skin: Warm and dry.  Right heel DTI Psych: Normal mood.  Normal behavior.   Verbose. Musc: No edema in extremities.  No tenderness in extremities. Neuro: Alert Decreased LT/pain sense aroung T5-6 with legs more affected than trunk, persistent Motor: LLE 1+/5 HF, KE, 1/5 ADF.  RLE: Flexion, knee extension 1+/5, ankle dorsiflexion 0/5  Assessment/Plan: 1. Functional deficits which require 3+ hours per day of interdisciplinary therapy in a comprehensive inpatient rehab setting. Physiatrist is providing close team supervision and 24 hour management of active medical problems listed below. Physiatrist and rehab team continue to assess barriers to discharge/monitor patient progress toward functional and medical goals  Care Tool:  Bathing    Body parts bathed by patient: Right arm, Left arm, Chest, Abdomen, Face, Front perineal area, Buttocks, Right upper leg, Left upper leg, Right lower leg, Left lower leg   Body parts bathed by helper: Right lower leg, Left lower leg Body parts n/a: Left lower leg, Right lower leg, Left upper leg, Right upper leg, Buttocks, Front perineal area   Bathing assist Assist Level: Contact Guard/Touching assist     Upper Body Dressing/Undressing Upper body dressing   What is the patient wearing?: Pull over shirt    Upper body assist Assist Level: Set up assist    Lower Body Dressing/Undressing Lower body dressing  What is the patient wearing?: Pants, Incontinence brief     Lower body assist Assist for lower body dressing: Minimal Assistance - Patient > 75%     Toileting Toileting Toileting Activity did not occur (Clothing management and hygiene only): N/A (no void or bm)  Toileting assist Assist for toileting: Maximal Assistance - Patient 25 - 49%     Transfers Chair/bed transfer  Transfers assist     Chair/bed transfer assist level: Supervision/Verbal cueing Chair/bed transfer assistive device: Armrests   Locomotion Ambulation   Ambulation assist   Ambulation activity did not occur: Safety/medical  concerns          Walk 10 feet activity   Assist  Walk 10 feet activity did not occur: Safety/medical concerns        Walk 50 feet activity   Assist Walk 50 feet with 2 turns activity did not occur: Safety/medical concerns         Walk 150 feet activity   Assist Walk 150 feet activity did not occur: Safety/medical concerns         Walk 10 feet on uneven surface  activity   Assist Walk 10 feet on uneven surfaces activity did not occur: Safety/medical concerns         Wheelchair     Assist Is the patient using a wheelchair?: Yes Type of Wheelchair: Manual    Wheelchair assist level: Independent Max wheelchair distance: 54'    Wheelchair 50 feet with 2 turns activity    Assist        Assist Level: Independent   Wheelchair 150 feet activity     Assist      Assist Level: Supervision/Verbal cueing   Blood pressure (!) 141/72, pulse 90, temperature 98.1 F (36.7 C), resp. rate 16, height 5\' 11"  (1.803 m), weight 71.5 kg, SpO2 100 %. Medical Problem List and Plan: 1.   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  Antibiotic course completed..  Follow-up with ID as outpatient with immunologic work-up and viral serology  Con't PT and OT- educated on colonization of Bladder when cathing- vs UTI.  2.  Antithrombotics: -DVT/anticoagulation:  Pharmaceutical: Lovenox.   10/11- vascular study (-)  10/12- will need Lovenox for 3 months total due to risk of DVT being so high.              -antiplatelet therapy: N/A 3. Pain Management: Tylenol/Advil as needed  Lidoderm patches- 3 of them 8pm to 8am- if not enough, might try tylenol #3.   Controlled with meds on 10/21 4. Mood: Provide emotional support             -antipsychotic agents: N/A 5. Neuropsych: This patient is capable of making decisions on his own behalf.  Appreciate neuropsych 6. Skin/Wound Care: Routine skin checks             -reiterated to pt that  he needs to wear PRAFO's for heel protection and to stretch heel cords  Discussed with nursing size/fitting 7. Fluids/Electrolytes/Nutrition: Routine in and outs 8.  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  Keppra 500 mg twice daily 9.  Sinus bradycardia.  Echocardiogram unremarkable.  No further work-up indicated per cardiology services  Controlled on 10/21 10.  Neurogenic bladder/Urinary retention.  Failed voiding trial.               cath if needed- if volumes >300cc  Flomax increased on 10/15, monitor blood pressures  Flomax increased Started on Urecholine 10 mg  TID on 10/20, will likely be increased UA/UC ordered 10/24- u/a SHOWS (+) NITRATE and >50 WBC- so will start Keflex 500 mg q 6 hours x 7 days- and wait for Cx.  11. Neurogenic bowel: begin scheduled senokot-s at HS.             -continue miralax daily             -consider bowel program depending upon continence and regularity             -pt reports no sense of bowel emptying currently.  10/24- pt doing own bowel program per pt-  12. At level SCI pain  See #3 13.  Mild hyponatremia: Resolved 14.  Elevated BUN: Resolved 15. R heel DTI and blood blister- local wound care and wear PRAFOs when in bed esp to prevent DTI from opening possibly.  16. Developing spasticity-   Stable 17. Sweating above level of lesion  Likely due to temp dysregulation, however patient also states that his significant other is sweating at night as well-he believes it is the thermostat in his room  Asked  Lurena Joiner RN if she can find a fan for him as he uses athome.  18.  Sleep disturbance  As needed melatonin added per patient request 19. Dry skin- ordered eucerin BID   I spent a total of 41 minutes on total care- >50% coordination of care- educating on UTI vs colonization, Sx's of urgency vs actual ability to void.  Also d/w need to treat UTI vs waiting.   LOS: 16 days A FACE TO FACE EVALUATION WAS PERFORMED  James Obrien 04/22/2021, 7:19 PM

## 2021-04-22 NOTE — Progress Notes (Signed)
Physical Therapy Weekly Progress Note  Patient Details  Name: James Obrien MRN: [id number] Date of Birth: 1986/01/26  Beginning of progress report period: April 15, 2021 End of progress report period: April 22, 2021  Today's Date: 04/22/2021 PT Individual Time: 1300-1409 PT Individual Time Calculation (min): 69 min   Patient has met 3 of 3 short term goals.  Pt has progressed with w/c mobility and transfers and is nearing mod I goals in preparation for d/c this week. Pt has not had functional return of LE strength for ambulation at this time. Have repeatedly educated pt on safety, he is only minimally receptive to education. Working with [employer] for custom w/c.   Patient continues to demonstrate the following deficits muscle weakness and muscle paralysis, impaired timing and sequencing, abnormal tone, decreased coordination, and decreased motor planning,  , and decreased sitting balance and decreased balance strategies and therefore will continue to benefit from skilled PT intervention to increase functional independence with mobility.  Patient progressing toward long term goals..  Continue plan of care.  PT Short Term Goals Week 2:  PT Short Term Goal 1 (Week 2): Pt will stand with mod A to RW PT Short Term Goal 1 - Progress (Week 2): Met PT Short Term Goal 2 (Week 2): Pt will initiate gait training PT Short Term Goal 2 - Progress (Week 2): Met PT Short Term Goal 3 (Week 2): Pt will perform dynamic reaching tasks outside BOS without LOB x 5 min PT Short Term Goal 3 - Progress (Week 2): Met Week 3:  PT Short Term Goal 1 (Week 3): =LTGs d/t ELOS  Skilled Therapeutic Interventions/Progress Updates:  Ambulation/gait training;Community reintegration;DME/adaptive equipment instruction;Neuromuscular re-education;Psychosocial support;Stair training;UE/LE Strength taining/ROM;Wheelchair propulsion/positioning;Balance/vestibular training;Discharge planning;Functional electrical  stimulation;Pain management;Skin care/wound management;UE/LE Coordination activities;Therapeutic Activities;Cognitive remediation/compensation;Disease management/prevention;Functional mobility training;Patient/family education;Splinting/orthotics;Therapeutic Exercise   Pt seated on commode on arrival and agreeable to therapy. Squat pivot to w/c with distant supervision. No complaint of pain. Pt requested to assist his wife taking bags out to there car. Pt navigated hospital, elevators with other people, and outdoor environment with steep inclines with supervision. VC for technique on hills. Upon returning to unit, pt performed Sit to stand in //bars. CGA to power up, mod-max A for knee block and terminal hip extension. Pt reported concern that he has lost strength in his RLE. Therapist noted mild change, but was providing similar levels of assist. Edema noted in RLE and pt reported tight sensation in quad with knee extension. Noted some hamstring tightness as well. Pt returned to room and performed supervision squat pivot to bed. Pt was set up to begin stretching on his own and was left with bed alarm active and needs in reach.   Therapy Documentation Precautions:  Precautions Precautions: Fall Precaution Comments: paraparesis; check BP Other Brace: TED hose Restrictions Weight Bearing Restrictions: No  Therapy/Group: Individual Therapy  Mickel Fuchs 04/22/2021, 3:52 PM

## 2021-04-22 NOTE — Progress Notes (Signed)
Occupational Therapy Session Note  Patient Details  Name: James Obrien MRN: [id number] Date of Birth: 1985/08/09  Today's Date: 04/22/2021 OT Individual Time: 2924-4628 OT Individual Time Calculation (min): 54 min    Short Term Goals: Week 1:  OT Short Term Goal 1 (Week 1): patient will complete lower body bathing dressing at bed level with min A using assistive devices OT Short Term Goal 1 - Progress (Week 1): Progressing toward goal OT Short Term Goal 2 (Week 1): patient will complete upper body bathing and dressing w/c level with set up OT Short Term Goal 2 - Progress (Week 1): Met OT Short Term Goal 3 (Week 1): patient will sit unsupported for 10+ minutes with min A OT Short Term Goal 3 - Progress (Week 1): Met OT Short Term Goal 4 (Week 1): patient will complete SB transfers with mod A of one OT Short Term Goal 4 - Progress (Week 1): Met Week 2:  OT Short Term Goal 1 (Week 2): Pt will complete bathing shower level MIN A with AE PRN OT Short Term Goal 2 (Week 2): Pt will perform BSC/toilet transfers CGA with no more than occasional cues for form OT Short Term Goal 3 (Week 2): Pt will perform LB dressing bed level Min A and adapative equipement PRN OT Short Term Goal 4 (Week 2): Pt will consistently perform squat pivot transfers CGA to various surfaces in prep for ADL   Skilled Therapeutic Interventions/Progress Updates:    Pt greeted at time of session sitting up in wheelchair with MD present for morning rounds, no c/o pain but does have itching feeling on low back and MD aware. After rounds, self propel room <> ground floor cafeteria with pt Mod I managing and purchasing coffee from w/c level. Self propel > ADL apartment and focused on problem solving through home set up for camper vs home with mother. Pt simulating home set up in camper for door <> toilet <> tub placement, recommending that this is too far for the pt to do a squat pivot/stand pivot transfer and do not recommend  doing this at home. However, pt states he has own technique planning on doing. Discussed with pt recommendation for using BSC in room/camper and having wife empty for safety. Sit > stand at kitchen counter and practiced side stepping and advancing BLEs to L side, cabinets providing knee block. Back in room set up with call bell in reach and wife present (trained on transfers).   Therapy Documentation Precautions:  Precautions Precautions: Fall Precaution Comments: paraparesis; check BP Other Brace: TED hose Restrictions Weight Bearing Restrictions: No     Therapy/Group: Individual Therapy  [name] 04/22/2021, 7:07 AM

## 2021-04-23 ENCOUNTER — Other Ambulatory Visit (HOSPITAL_COMMUNITY): Payer: Self-pay

## 2021-04-23 LAB — URINE CULTURE: Culture: >=100000 — AB

## 2021-04-23 MED ORDER — BACLOFEN 5 MG PO TABS
5.0000 mg | ORAL_TABLET | Freq: Three times a day (TID) | ORAL | 0 refills | Status: DC
  Filled 2021-04-23 (×2): qty 90, 30d supply, fill #0

## 2021-04-23 MED ORDER — BACLOFEN 5 MG HALF TABLET
5.0000 mg | ORAL_TABLET | Freq: Three times a day (TID) | ORAL | Status: DC
  Administered 2021-04-23 – 2021-04-25 (×7): 5 mg via ORAL
  Filled 2021-04-23 (×7): qty 1

## 2021-04-23 MED ORDER — SENNOSIDES-DOCUSATE SODIUM 8.6-50 MG PO TABS
2.0000 | ORAL_TABLET | Freq: Every day | ORAL | Status: DC
Start: 2021-04-23 — End: 2021-05-07

## 2021-04-23 MED ORDER — BETHANECHOL CHLORIDE 10 MG PO TABS
10.0000 mg | ORAL_TABLET | Freq: Three times a day (TID) | ORAL | 0 refills | Status: DC
  Filled 2021-04-23 (×2): qty 21, 7d supply, fill #0

## 2021-04-23 MED ORDER — LEVETIRACETAM 500 MG PO TABS
500.0000 mg | ORAL_TABLET | Freq: Two times a day (BID) | ORAL | 0 refills | Status: DC
  Filled 2021-04-23 (×2): qty 60, 30d supply, fill #0

## 2021-04-23 MED ORDER — POLYETHYLENE GLYCOL 3350 17 G PO PACK: 17.0000 g | PACK | Freq: Every day | ORAL | 0 refills | Status: DC

## 2021-04-23 MED ORDER — TAMSULOSIN HCL 0.4 MG PO CAPS
0.8000 mg | ORAL_CAPSULE | Freq: Every day | ORAL | 0 refills | Status: DC
  Filled 2021-04-23 (×2): qty 60, 30d supply, fill #0

## 2021-04-23 MED ORDER — CEPHALEXIN 500 MG PO CAPS
500.0000 mg | ORAL_CAPSULE | Freq: Four times a day (QID) | ORAL | 0 refills | Status: DC
  Filled 2021-04-23 (×2): qty 24, 6d supply, fill #0

## 2021-04-23 MED ORDER — MELATONIN 3 MG PO TABS
3.0000 mg | ORAL_TABLET | Freq: Every day | ORAL | 0 refills | Status: DC
  Filled 2021-04-23 (×2): qty 30, 30d supply, fill #0

## 2021-04-23 MED ORDER — LIDOCAINE 5 % EX PTCH
3.0000 | MEDICATED_PATCH | CUTANEOUS | 0 refills | Status: DC
  Filled 2021-04-23 (×2): qty 60, 20d supply, fill #0

## 2021-04-23 MED ORDER — ACETAMINOPHEN 325 MG PO TABS: 650.0000 mg | ORAL_TABLET | Freq: Four times a day (QID) | ORAL | Status: DC | PRN

## 2021-04-23 MED ORDER — BISACODYL 10 MG RE SUPP: 10.0000 mg | Freq: Every day | RECTAL | 0 refills | Status: DC

## 2021-04-23 MED ORDER — ENOXAPARIN SODIUM 40 MG/0.4ML IJ SOSY
PREFILLED_SYRINGE | INTRAMUSCULAR | 2 refills | Status: DC
  Filled 2021-04-23: qty 12, 30d supply, fill #0
  Filled 2021-04-23: qty 18.9, fill #0
  Filled 2021-05-27: qty 12, 30d supply, fill #1

## 2021-04-23 NOTE — Progress Notes (Signed)
Occupational Therapy Session Note  Patient Details  Name: James Obrien MRN: [id number] Date of Birth: 1985-09-05  Today's Date: 04/23/2021 OT Individual Time: 1300-1356 and 5188-4166 OT Individual Time Calculation: 56 min and 40 min   Short Term Goals: Week 1:  OT Short Term Goal 1 (Week 1): patient will complete lower body bathing dressing at bed level with min A using assistive devices OT Short Term Goal 1 - Progress (Week 1): Progressing toward goal OT Short Term Goal 2 (Week 1): patient will complete upper body bathing and dressing w/c level with set up OT Short Term Goal 2 - Progress (Week 1): Met OT Short Term Goal 3 (Week 1): patient will sit unsupported for 10+ minutes with min A OT Short Term Goal 3 - Progress (Week 1): Met OT Short Term Goal 4 (Week 1): patient will complete SB transfers with mod A of one OT Short Term Goal 4 - Progress (Week 1): Met Week 2:  OT Short Term Goal 1 (Week 2): Pt will complete bathing shower level MIN A with AE PRN OT Short Term Goal 2 (Week 2): Pt will perform BSC/toilet transfers CGA with no more than occasional cues for form OT Short Term Goal 3 (Week 2): Pt will perform LB dressing bed level Min A and adapative equipement PRN OT Short Term Goal 4 (Week 2): Pt will consistently perform squat pivot transfers CGA to various surfaces in prep for ADL   Skilled Therapeutic Interventions/Progress Updates:    Session 1: Pt greeted at time of session up in wheelchair no pain reported and agreeable to OT session. Initial aspect of session pt on phone call with worker's comp, and once finished with phone call pt educated on blood clot/DVT prevention regarding BLE elevation and TEDS. Pt performing quick wash up at sink with Mod I and changing shirt same manner. LB dressing for changing pants w/c level with leaning L/R and partial buttocks lifts with Set up. Pt self propel room <> cafeteria with Mod I and able to manage coffee and purchasing without  assist. Self propel > ADL apartment same manner and reviewed IADL retraining kitchen level with pt retrieving items from fridge and multiple cabinets for simulated meal prep activity with Mod I and only supervision for new environment. Back in room set up with call bell in reach all needs met.    Session 2: Pt greeted at time of session up in chair, no pain reported, discussing wheelchair options with [employer] representative. OT assisting with answering questions as needed regarding pt needs for chair, DC environment, assist at time of DC, medical diagnoses, CLOF, and other questions as needed for wheelchair evaluation. Pt up in chair with call bell in reach all needs met at end of session.   Therapy Documentation Precautions:  Precautions Precautions: Fall Precaution Comments: paraparesis; check BP Other Brace: TED hose Restrictions Weight Bearing Restrictions: No    Therapy/Group: Individual Therapy  [name] 04/23/2021, 7:19 AM

## 2021-04-23 NOTE — Progress Notes (Signed)
Patient ID: James Obrien, male   DOB: 1986/03/08, 35 y.o.   MRN: [id number] Team Conference Report to Patient/Family  Team Conference discussion was reviewed with the patient and caregiver, including goals, any changes in plan of care and target discharge date.  Patient and caregiver express understanding and are in agreement.  The patient has a target discharge date of 04/25/21.  SW met with patient, provided conference updates. Pt speaking with family/friends/lawyer about pursuing workers comp. Patient waiting on signed medical form. OP set, [place] established. DME ordered. Caths and briefs ordered through [employer]. Pt WC eval today. No additional questions or concerns, sw will cont to follow up.  [name] 04/23/2021, 1:38 PM

## 2021-04-23 NOTE — Patient Care Conference (Signed)
Inpatient RehabilitationTeam Conference and Plan of Care Update Date: 04/23/2021   Time: 11:51 AM    Patient Name: James Obrien [facility]      Medical Record Number: [id number]  Date of Birth: [birth year] Sex: Male         Room/Bed: 5C13C/5C13C-01 Payor Info: Payor: [employer] / Plan: Research scientist (life sciences) / Product Type: *No Product type* /    Admit Date/Time:  04/06/2021  3:55 PM  Primary Diagnosis:  Myelitis [facility])  Hospital Problems: Principal Problem:   Myelitis (HCC) Active Problems:   Encephalitis   Elevated BUN   Hyponatremia   Neurogenic bowel   Neurogenic bladder   Deep tissue injury   Neuropathic pain   Sleep disturbance    Expected Discharge Date: Expected Discharge Date: 04/25/21  Team Members Present: Physician leading conference: Dr. Genice Rouge Social Worker Present: [name], BSW Nurse Present: Kennyth Arnold, RN PT Present: Merry Lofty, PT OT Present: Earleen Newport, OT PPS Coordinator present : Edson Snowball, PT     Current Status/Progress Goal Weekly Team Focus  Bowel/Bladder   I/O cath q6hr; bowel program LBM: 10/23  Regain regular bowel pattern  Assist with toileting needs prn   Swallow/Nutrition/ Hydration             ADL's   Min/Mod simulated toilet (doing bowel program and cathing), Supervision bathing, Supervision transfers, Supervision LB dressing with lateral leans or bed level  mod I/Supervision  core strength, sit <> stands as able, ADl retraining, DC planning, BLE NMR   Mobility   supervision-mod I transfer, CGA car transfer,  upgraded to mod I  d/c planning   Communication             Safety/Cognition/ Behavioral Observations            Pain   c/o pain to lower back; scheduled lidocaine patches  decrease lower back pain  assess pain QS and prn   Skin   healed penile lesions  maintain skin integrity  assess skin QS and prn     Discharge Planning:  Discharging home with his mother in [place], [place]. Patient set up with OP  therapies. DME ordered. Waiting on spouse for PCP preference   Team Discussion: Spasticity to RLE. Positive for UTI. Experiencing night sweats. Started Baclofen. Still not voiding, I&O cath, continent bowel. Reports no pain or sleep issues. DTI to right heel with foam dressing applied. Ready for discharge. Will order single use catheters due to UTI. Patient on target to meet rehab goals: yes, squat pivot transfers at supervision, does have his own way. Working on problem solving. Mod I/supervision at W/C level. Will evaluate Neumotion W/C today. Impulsive. Working on floor transfers. Mod I goals. Recommend BSC due to size of home bathroom. Grad day tomorrow.   *See Care Plan and progress notes for long and short-term goals.   Revisions to Treatment Plan:  Adjusting medications.  Teaching Needs: Family education, medication management, I&O cathing education, bowel program education, Lovenox injection education, safety awareness.  Current Barriers to Discharge: Decreased caregiver support, Home enviroment access/layout, Incontinence, Neurogenic bowel and bladder, Wound care, Weight, Weight bearing restrictions, and Medication compliance  Possible Resolutions to Barriers: Family education, teach I&O cathing to patient, provide single use catheters, teach bowel program, continue current medications, teach Lovenox injections.     Medical Summary Current Status: new onset spasticity and UTI- on Keflex- OK'd by ID pharmacist; i/o caths; (+) bowel program; skin ok except blood blister almost healed and R heel DTI-  Barriers to Discharge: Decreased family/caregiver support;Home enviroment access/layout;Incontinence;Neurogenic Bowel & Bladder;Medical stability;Weight;Weight bearing restrictions;Wound care  Barriers to Discharge Comments: biggest barriers bowel/bladder and skin- and new spasticity/UTI-wants to go back to camper vs Mothers- [place], [place] Possible Resolutions to [employer]: needs  single use catheters since has UTI- needs educaiton on cathing and bowel program and Lovenox- not always safe ways- problem solving;- focus- d/c training; d/c Thursday 10/27   Continued Need for Acute Rehabilitation Level of Care: The patient requires daily medical management by a physician with specialized training in physical medicine and rehabilitation for the following reasons: Direction of a multidisciplinary physical rehabilitation program to maximize functional independence : Yes Medical management of patient stability for increased activity during participation in an intensive rehabilitation regime.: Yes Analysis of laboratory values and/or radiology reports with any subsequent need for medication adjustment and/or medical intervention. : Yes   I attest that I was present, lead the team conference, and concur with the assessment and plan of the team.   Tennis Must 04/23/2021, 4:34 PM

## 2021-04-23 NOTE — Progress Notes (Addendum)
Education Note:  This nurse educated/reinforced education on  The following: Special educational needs teacher, I/O Catheterization, Bowel Program, and Lovenox Administration.  Per patient his wife was previously taught by Lurena Joiner on how to perform I/O Catheterization. Wife verbally stated via patient's phone that she can perform I/O Catheterizations. Patient provided teach back and mimicked   each step on Lovenox administration and bowel program.  Patient provided return demonstration on bowel program. MD Notified.

## 2021-04-23 NOTE — Progress Notes (Signed)
Physical Therapy Session Note  Patient Details  Name: James Obrien MRN: [id number] Date of Birth: 1986-01-26  Today's Date: 04/23/2021 PT Individual Time: 1000-1100; 5170-0174 PT Individual Time Calculation (min): 60 min and 42 mins   Short Term Goals: Week 3:  PT Short Term Goal 1 (Week 3): =LTGs d/t ELOS  Skilled Therapeutic Interventions/Progress Updates:    Session 1: Patient received sitting up in wc, agreeable to PT. He denies pain. Patient requesting to go to [facility] for coffee. He was able to propel himself in wc ModI community distances. PT tightening wc brake for increased safety with transfers. Patient completing multiple sit <> stands + fwd/retro stepping in // bars. CGA to stand + blocking B knees. ACE wrap applied to R knee to provide increased proprioceptive input. Prolonged stretch in weightbearing to R LE with minimal results noted. Patient with heavy reliance on B UE when weight shifting onto R LE. Patient completing Kinetron AROM with emphasis on R LE extension activation. Patient returning to room in wc, transferring to toilet with supervision via lateral scoot. Supervision clothing management in sitting. Patient remaining on toilet, needs within reach, RN aware of patients location.    Session 2: Patient received sitting up in wc, agreeable to PT. On phone with coworker regarding workmans comp claims. RN present to provide brief education on I&o cath, dig stim, lovenox shots. Patient reports difficulty with self-cathing due to the "mental side of it." He reports that his wife is willing to help him. Patient also reports that he is able to complete bowel program and lovenox shot himself. Patient propelling himself in wc to therapy gym ModI. Standing prolonged stretch to R LE. With increased R knee extension, R hip posterior rotation and flexion noted. Patient likely with shortened hip flexors R> L and shortened hamstrings. Patient tolerated prolonged stretch well, but was unable  to truly weight shift onto R LE in standing. Patient verbalizing frustration regarding disability paperwork process and "logistics" of workman's comp. PT providing appropriate counseling. Patient also asking about standing exercises for at home- PT advising against completing standing/gait exercises when at home outside of skilled therapy. He verbalized understanding. Patient returning to room in wc, all needs within reach.   Therapy Documentation Precautions:  Precautions Precautions: Fall Precaution Comments: paraparesis; check BP Other Brace: TED hose Restrictions Weight Bearing Restrictions: No    Therapy/Group: Individual Therapy  Elizebeth Koller, PT, DPT, CBIS  04/23/2021, 7:50 AM

## 2021-04-23 NOTE — Progress Notes (Addendum)
PROGRESS NOTE   Subjective/Complaints:  Still having night sweats- annoying.   Difficulty straightening R knee- sounds like spasticity.    Also felt like R leg was weaker today- buckled on him.   ROS:   Pt denies SOB, abd pain, CP, N/V/C/D, and vision changes   Objective:   No results found. Recent Labs    04/22/21 0718  WBC 4.2  HGB 13.2  HCT 40.5  PLT 189    No results for input(s): NA, K, CL, CO2, GLUCOSE, BUN, CREATININE, CALCIUM in the last 72 hours.    Intake/Output Summary (Last 24 hours) at 04/23/2021 1039 Last data filed at 04/23/2021 0808 Gross per 24 hour  Intake 712 ml  Output 1900 ml  Net -1188 ml     Pressure Injury 04/12/21 Heel Right Deep Tissue Pressure Injury - Purple or maroon localized area of discolored intact skin or blood-filled blister due to damage of underlying soft tissue from pressure and/or shear. Konrad Dolores RN  verification (Active)  04/12/21 1045  Location: Heel  Location Orientation: Right  Staging: Deep Tissue Pressure Injury - Purple or maroon localized area of discolored intact skin or blood-filled blister due to damage of underlying soft tissue from pressure and/or shear.  Wound Description (Comments): Konrad Dolores RN  verification  Present on Admission: Yes    Physical Exam: Vital Signs Blood pressure 114/63, pulse (!) 54, temperature 97.9 F (36.6 C), resp. rate 18, height 5\' 11"  (1.803 m), weight 71.5 kg, SpO2 97 %.    General: awake, alert, appropriate, sitting up eating breakfast EOB; NAD HENT: conjugate gaze; oropharynx moist CV: regular rhythm; mildly bradycardic rate; no JVD Pulmonary: CTA B/L; no W/R/R- good air movement GI: soft, NT, ND, (+)BS Psychiatric: appropriate- interactive Neurological: Ox3 MAS of 2 in R knee; MAS of 1- 1+ in R hip and ankle- difficult to straighten R knee completely.  Skin: Warm and dry.  Right heel DTI- stable Musc:  No edema in extremities.  No tenderness in extremities. Neuro: Alert Decreased LT/pain sense aroung T5-6 with legs more affected than trunk, persistent Motor: LLE 1+/5 HF, KE, 1/5 ADF.  RLE: Flexion, knee extension 1+/5, ankle dorsiflexion 0/5  Assessment/Plan: 1. Functional deficits which require 3+ hours per day of interdisciplinary therapy in a comprehensive inpatient rehab setting. Physiatrist is providing close team supervision and 24 hour management of active medical problems listed below. Physiatrist and rehab team continue to assess barriers to discharge/monitor patient progress toward functional and medical goals  Care Tool:  Bathing    Body parts bathed by patient: Right arm, Left arm, Chest, Abdomen, Face, Front perineal area, Buttocks, Right upper leg, Left upper leg, Right lower leg, Left lower leg   Body parts bathed by helper: Right lower leg, Left lower leg Body parts n/a: Left lower leg, Right lower leg, Left upper leg, Right upper leg, Buttocks, Front perineal area   Bathing assist Assist Level: Contact Guard/Touching assist     Upper Body Dressing/Undressing Upper body dressing   What is the patient wearing?: Pull over shirt    Upper body assist Assist Level: Set up assist    Lower Body Dressing/Undressing Lower body dressing  What is the patient wearing?: Pants, Incontinence brief     Lower body assist Assist for lower body dressing: Minimal Assistance - Patient > 75%     Toileting Toileting Toileting Activity did not occur (Clothing management and hygiene only): N/A (no void or bm)  Toileting assist Assist for toileting: Maximal Assistance - Patient 25 - 49%     Transfers Chair/bed transfer  Transfers assist     Chair/bed transfer assist level: Supervision/Verbal cueing Chair/bed transfer assistive device: Armrests   Locomotion Ambulation   Ambulation assist   Ambulation activity did not occur: Safety/medical concerns           Walk 10 feet activity   Assist  Walk 10 feet activity did not occur: Safety/medical concerns        Walk 50 feet activity   Assist Walk 50 feet with 2 turns activity did not occur: Safety/medical concerns         Walk 150 feet activity   Assist Walk 150 feet activity did not occur: Safety/medical concerns         Walk 10 feet on uneven surface  activity   Assist Walk 10 feet on uneven surfaces activity did not occur: Safety/medical concerns         Wheelchair     Assist Is the patient using a wheelchair?: Yes Type of Wheelchair: Manual    Wheelchair assist level: Independent Max wheelchair distance: 88'    Wheelchair 50 feet with 2 turns activity    Assist        Assist Level: Independent   Wheelchair 150 feet activity     Assist      Assist Level: Supervision/Verbal cueing   Blood pressure 114/63, pulse (!) 54, temperature 97.9 F (36.6 C), resp. rate 18, height 5\' 11"  (1.803 m), weight 71.5 kg, SpO2 97 %. Medical Problem List and Plan: 1.   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  Antibiotic course completed..  Follow-up with ID as outpatient with immunologic work-up and viral serology  Con't PT and OT_ team conference today to f/u on progress 2.  Antithrombotics: -DVT/anticoagulation:  Pharmaceutical: Lovenox.   10/11- vascular study (-)  10/12- will need Lovenox for 3 months total due to risk of DVT being so high.   10/25- let pt know             -antiplatelet therapy: N/A 3. Pain Management: Tylenol/Advil as needed  Lidoderm patches- 3 of them 8pm to 8am- if not enough, might try tylenol #3.   Controlled with meds on 10/21 4. Mood: Provide emotional support             -antipsychotic agents: N/A 5. Neuropsych: This patient is capable of making decisions on his own behalf.  Appreciate neuropsych 6. Skin/Wound Care: Routine skin checks             -reiterated to pt that he needs to wear  PRAFO's for heel protection and to stretch heel cords  Discussed with nursing size/fitting 7. Fluids/Electrolytes/Nutrition: Routine in and outs 8.  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  Keppra 500 mg twice daily 9.  Sinus bradycardia.  Echocardiogram unremarkable.  No further work-up indicated per cardiology services  Controlled on 10/21 10.  Neurogenic bladder/Urinary retention.  Failed voiding trial.               cath if needed- if volumes >300cc  Flomax increased on 10/15, monitor blood pressures  Flomax increased Started on Urecholine  10 mg TID on 10/20, will likely be increased UA/UC ordered 10/24- u/a SHOWS (+) NITRATE and >50 WBC- so will start Keflex 500 mg q 6 hours x 7 days- and wait for Cx 10/25- E Coli UTI- but resistant to PCN- will have pharmacy check if can con't Keflex- will let me know 11. Neurogenic bowel: begin scheduled senokot-s at HS.             -continue miralax daily             -consider bowel program depending upon continence and regularity             -pt reports no sense of bowel emptying currently.  10/24- pt doing own bowel program per pt-  12. At level SCI pain  See #3 13.  Mild hyponatremia: Resolved 14.  Elevated BUN: Resolved 15. R heel DTI and blood blister- local wound care and wear PRAFOs when in bed esp to prevent DTI from opening possibly.  16. Developing spasticity-   Stable 17. Sweating above level of lesion  Likely due to temp dysregulation, however patient also states that his significant other is sweating at night as well-he believes it is the thermostat in his room  Asked  Lurena Joiner RN if she can find a fan for him as he uses athome.   10/25- will see if related to UTI 18.  Sleep disturbance  As needed melatonin added per patient request 19. Dry skin- ordered eucerin BID 20. New spasticity of RLE  10/25- will add Baclofen 5 mg TID for spasticity- educated pt on reason he needs it.   I spent a total fo 37 minutes on total  care- >50% on coordination of care with team conference and speaking with pt about bladder as well as speaking with pharmacy about UTI/ABX.    LOS: 17 days A FACE TO FACE EVALUATION WAS PERFORMED  Aryav Wimberly 04/23/2021, 10:39 AM

## 2021-04-23 NOTE — Progress Notes (Signed)
Patient ID: James Obrien, male   DOB: Apr 12, 1986, 35 y.o.   MRN: [id number]  Patient demographics sent to [employer] for [facility], [place] [phone]

## 2021-04-24 NOTE — Progress Notes (Signed)
Physical Therapy Discharge Summary  Patient Details  Name: James Obrien MRN: [id number] Date of Birth: March 18, 1986  Today's Date: 04/24/2021 PT Individual Time: 1400-1448, 1100-1209 PT Individual Time Calculation (min): 48 min, 69 min   Patient has met 3 of 4 long term goals due to improved activity tolerance, improved balance, improved postural control, increased strength, and ability to compensate for deficits.  Patient to discharge at a wheelchair level Modified Independent.   Patient's care partner is independent to provide the necessary physical assistance at discharge.  Reasons goals not met: Pt did not meet mod I car transfer d/t height of car seat, requires supervision for safe transfer.  Recommendation:  Patient will benefit from ongoing skilled PT services in outpatient setting to continue to advance safe functional mobility, address ongoing impairments in functional mobility,w/c mobility, LE strength, and minimize fall risk.  Equipment: Custom w/c   Reasons for discharge: treatment goals met and discharge from hospital  Patient/family agrees with progress made and goals achieved: Yes  Skilled Therapeutic Interventions/Progress Updates: Session 1: pt received in bed and agreeable to therapy. Pt expressed frustration over dealing with insurance and medical equipment companies. Discussed d/c planning at length throughout session. Pt with knee brace on RLE.  First part of session focused on training pt on knee brace for standing, including trialling different lock settings. Then transitioned to car simulator, again trialling best methods of utilizing knee brace for transfer. Pt performed transfer x4 with CGA fading to supervision with brace locked at ~30  deg. Pt propelled w/c throughout session, independently managing leg rests and brakes. Extensive conversation for d/c planning, including bathroom navigation for home and navigating camper. Pt remained in chair at end of  session and was left with all needs in reach.  Session 2: Pt seated in w/c on arrival and agreeable to therapy. No immediate complaint of pain. Pt propelled w/c with BUE, navigating elevators and >300 ft, mod I. Strength and sensation testing performed in seated. Discussed d/c plan extensively during session, while completing d/c assessment. Pt reported that he has had LBP throughout stay, worst in the mornings shortly after waking up, no intervention required at this time. Pt returned to room and dicussed HEP, with demonstration of what exercises are appropriate and how much assistance will be required.  Pt stood with RW and with countertop, pt advised that RW will likely be safer for most exercises and was advised to always perform standing activity in front of chair with his wife present until he sees greater functional return in BLE. Discussed pt's goals and prognosis, and expectations for further therapy. Pt remained seated in w/c at end of session and was left with all needs in reach.   PT Discharge Precautions/Restrictions Precautions Precautions: Fall Precaution Comments: paraparesis Required Braces or Orthoses: Other Brace Other Brace: TED hose Restrictions Weight Bearing Restrictions: No Vital Signs   Pain Pain Assessment Pain Scale: 0-10 Pain Score: 0-No pain Pain Interference Pain Interference Pain Effect on Sleep: 1. Rarely or not at all Pain Interference with Therapy Activities: 1. Rarely or not at all Pain Interference with Day-to-Day Activities: 1. Rarely or not at all Vision/Perception  Vision - History Ability to See in Adequate Light: 0 Adequate Perception Perception: Within Functional Limits Praxis Praxis: Intact  Cognition Overall Cognitive Status: Within Functional Limits for tasks assessed Arousal/Alertness: Awake/alert Orientation Level: Oriented X4 Year: 2022 Month: October Day of Week: Correct Attention: Divided Divided Attention: Appears  intact Memory: Appears intact Immediate Memory Recall: Sock;Blue;Bed  Memory Recall Sock: Without Cue Memory Recall Blue: Without Cue Memory Recall Bed: Without Cue Awareness: Appears intact Problem Solving: Appears intact Safety/Judgment: Appears intact (somewhat impuslive) Sensation Sensation Light Touch: Impaired by gross assessment Additional Comments: Correctly identifies light tough but reports "pins and needles" sensation. 65% in LLE compared to UE, 50% RLE compared to UE Coordination Gross Motor Movements are Fluid and Coordinated: No Fine Motor Movements are Fluid and Coordinated: No Coordination and Movement Description: Gross weakness of core and bilateral lower extremities, greatly improved from baseline Finger Nose Finger Test: [facility] Motor  Motor Motor: Paraplegia;Abnormal postural alignment and control;Abnormal tone;Motor impersistence Motor - Skilled Clinical Observations: poor trunk control and lower body mobility Motor - Discharge Observations: Greatly improved from baseline, improved core control  Mobility Bed Mobility Bed Mobility: Rolling Right;Rolling Left;Sit to Supine;Right Sidelying to Sit;Supine to Sit Rolling Right: Independent Rolling Left: Independent Right Sidelying to Sit: Independent with assistive device Supine to Sit: Independent with assistive device Sit to Supine: Independent with assistive device Transfers Transfers: Lateral/Scoot Transfers Sit to Stand: Contact Guard/Touching assist Stand to Sit: Contact Guard/Touching assist Lateral/Scoot Transfers: Independent with assistive device Locomotion  Gait Ambulation: Yes Gait Assistance: Moderate Assistance - Patient 50-74% Gait Distance (Feet): 6 Feet Assistive device: Parallel bars (knee extension brace) Gait Assistance Details: Verbal cues for sequencing;Manual facilitation for placement;Verbal cues for safe use of DME/AE;Verbal cues for gait pattern Gait Gait: No Stairs / Additional  Locomotion Ramp: Independent with assistive device Curb: Minimal Assistance - Patient >75% Wheelchair Mobility Wheelchair Mobility: Yes Wheelchair Assistance: Independent with Camera operator: Both upper extremities Wheelchair Parts Management: Independent Distance: 500+  Trunk/Postural Assessment  Cervical Assessment Cervical Assessment: Within Functional Limits Thoracic Assessment Thoracic Assessment: Within Functional Limits Lumbar Assessment Lumbar Assessment: Exceptions to [facility] Postural Control Postural Control: Deficits on evaluation Trunk Control: occ LOB in sitting, able to recover independently  Balance Balance Balance Assessed: Yes Static Sitting Balance Static Sitting - Balance Support: Feet supported Static Sitting - Level of Assistance: 7: Independent Dynamic Sitting Balance Dynamic Sitting - Balance Support: During functional activity Dynamic Sitting - Level of Assistance: 6: Modified independent (Device/Increase time) Static Standing Balance Static Standing - Balance Support: Bilateral upper extremity supported Static Standing - Level of Assistance: 4: Min assist Extremity Assessment  RUE Assessment RUE Assessment: Within Functional Limits LUE Assessment LUE Assessment: Within Functional Limits RLE Assessment General Strength Comments: 3-/5 overall RLE Tone RLE Tone: Hypertonic Hypertonic Details: improved with medical management LLE Assessment LLE Assessment: Exceptions to [facility] General Strength Comments: motor impersistence, 4/5 fading to 1/5 ~5 sec LLE Tone LLE Tone: Within Functional Limits    Mickel Fuchs 04/24/2021, 6:11 PM

## 2021-04-24 NOTE — Progress Notes (Addendum)
Inpatient Rehabilitation Discharge Medication Review by a Pharmacist  A complete drug regimen review was completed for this patient to identify any potential clinically significant medication issues.  High Risk Drug Classes Is patient taking? Indication by Medication  Antipsychotic No   Anticoagulant Yes Lovenox 40 mg SQ daily: VTE prophylaxis  Antibiotic Yes Cephalexin for E coli UTI   Opioid No   Antiplatelet No   Hypoglycemics/insulin No   Vasoactive Medication No   Chemotherapy No   Other No Bethanechol: neurogenic bladder/ urinary retention Tamsulosin: urinary flow Baclofen: antispasmodic Leveritacetam: seizure prophylaxis Lidocaine patches: pain Melatonin: sleep Miralax, Bisacodyl and Senokot-docusate: laxatives Prn tylenol for ibuprofen for or mild pain or fever; prn ibuprofen for headache     Type of Medication Issue Identified Description of Issue Recommendation(s)  Drug Interaction(s) (clinically significant)     Duplicate Therapy     Allergy     No Medication Administration End Date     Incorrect Dose     Additional Drug Therapy Needed     Significant med changes from prior encounter (inform family/care partners about these prior to discharge).    Other       Clinically significant medication issues were identified that warrant physician communication and completion of prescribed/recommended actions by midnight of the next day:  No  Pharmacist comments:    Noted plan for Lovenox for VTE prophylaxis for 3 months due to high risk for DVT.   Cephalexin course begun 04/22/21 for E coli UTI. 7-day course to be completed as outpatient.  Time spent performing this drug regimen review (minutes):  20   Dennie Fetters, [place] 04/24/2021 3:55 PM

## 2021-04-24 NOTE — Progress Notes (Signed)
Occupational Therapy Discharge Summary  Patient Details  Name: James Obrien MRN: [id number] Date of Birth: 1986/01/04    Patient has met 9 of 9 long term goals due to improved activity tolerance, improved balance, postural control, ability to compensate for deficits, improved awareness, and improved coordination.  Patient to discharge at overall Modified Independent - Set up level.  Patient's care partner is independent to provide the necessary physical assistance at discharge.  Pt is overall Mod I - set up from wheelchair level with all ADL transfers, shower level bathing, and toileting. Pt is able to access his environment from w/c level Mod I. Note that recommended pt sponge bathe at home if going to camper and only to use shower in wheelchair accessible places for fall prevention. Recommended using DABSC as well instead of going into bathroom as it is not accessible in camper. Wife has been present for several sessions as well for observation and family training as able. Pt has been receptive to some education for safety but not receptive to other education, as he has his "own way" of doing things.   Reasons goals not met: NA   Recommendation:  No further OT needed, further rehab needs met with PT  Equipment: Drop arm BSC, TTB   Reasons for discharge: treatment goals met and discharge from hospital  Patient/family agrees with progress made and goals achieved: Yes  OT Discharge Precautions/Restrictions  Precautions Precautions: Fall Precaution Comments: paraparesis Required Braces or Orthoses: Other Brace Other Brace: TED hose Restrictions Weight Bearing Restrictions: No Vital Signs Therapy Vitals Temp: 98.1 F (36.7 C) Pulse Rate: 81 Resp: 16 BP: 125/86 Patient Position (if appropriate): Sitting Oxygen Therapy SpO2: 99 % O2 Device: Room Air Pain Pain Assessment Pain Scale: 0-10 Pain Score: 0-No pain ADL ADL Eating: Independent Where Assessed-Eating: Bed  level Grooming: Modified independent Where Assessed-Grooming: Edge of bed Upper Body Bathing: Setup Where Assessed-Upper Body Bathing: Edge of bed Lower Body Bathing: Setup Where Assessed-Lower Body Bathing: Bed level Upper Body Dressing: Independent Where Assessed-Upper Body Dressing: Edge of bed Lower Body Dressing: Modified independent Where Assessed-Lower Body Dressing: Bed level Toileting: Modified independent Where Assessed-Toileting: Bed level Toilet Transfer: Modified independent Toilet Transfer Method: Engineer, water: Drop arm bedside Microbiologist: Modified independent ADL Comments: requires max A to maintain sitting edge of bed for grooming and upper body adl Vision Baseline Vision/History: 0 No visual deficits Patient Visual Report: No change from baseline Vision Assessment?: No apparent visual deficits Perception  Perception: Within Functional Limits Praxis Praxis: Intact Cognition Overall Cognitive Status: Within Functional Limits for tasks assessed Arousal/Alertness: Awake/alert Orientation Level: Oriented X4 Year: 2022 Month: October Day of Week: Correct Attention: Divided Divided Attention: Appears intact Memory: Appears intact Immediate Memory Recall: Sock;Blue;Bed Memory Recall Sock: Without Cue Memory Recall Blue: Without Cue Memory Recall Bed: Without Cue Awareness: Appears intact Problem Solving: Appears intact Safety/Judgment: Appears intact (somewhat impuslive) Sensation Sensation Light Touch: Impaired by gross assessment Additional Comments: Correctly identifies light tough but reports "pins and needles" sensation. 65% in LLE compared to UE, 50% RLE compared to UE Coordination Gross Motor Movements are Fluid and Coordinated: No Fine Motor Movements are Fluid and Coordinated: No Coordination and Movement Description: Gross weakness of core and bilateral lower extremities, greatly improved from  baseline Finger Nose Finger Test: [facility] Motor  Motor Motor: Paraplegia;Abnormal postural alignment and control;Abnormal tone;Motor impersistence Motor - Skilled Clinical Observations: poor trunk control and lower body mobility Motor - Discharge Observations: Greatly  improved from baseline, improved core control Mobility  Bed Mobility Bed Mobility: Rolling Right;Rolling Left;Sit to Supine;Right Sidelying to Sit;Supine to Sit Rolling Right: Independent Rolling Left: Independent Right Sidelying to Sit: Independent with assistive device Supine to Sit: Independent with assistive device Sit to Supine: Independent with assistive device Transfers Sit to Stand: Contact Guard/Touching assist Stand to Sit: Contact Guard/Touching assist  Trunk/Postural Assessment  Cervical Assessment Cervical Assessment: Within Functional Limits Thoracic Assessment Thoracic Assessment: Within Functional Limits Lumbar Assessment Lumbar Assessment: Exceptions to [facility] Postural Control Postural Control: Deficits on evaluation Trunk Control: occ LOB in sitting, able to recover independently  Balance Balance Balance Assessed: Yes Static Sitting Balance Static Sitting - Balance Support: Feet supported Static Sitting - Level of Assistance: 7: Independent Dynamic Sitting Balance Dynamic Sitting - Balance Support: During functional activity Dynamic Sitting - Level of Assistance: 6: Modified independent (Device/Increase time) Static Standing Balance Static Standing - Balance Support: Bilateral upper extremity supported Static Standing - Level of Assistance: 4: Min assist Extremity/Trunk Assessment RUE Assessment RUE Assessment: Within Functional Limits LUE Assessment LUE Assessment: Within Functional Limits   [name] 04/24/2021, 3:54 PM

## 2021-04-24 NOTE — Progress Notes (Signed)
Occupational Therapy Session Note  Patient Details  Name: James Obrien MRN: [id number] Date of Birth: May 27, 1986  Today's Date: 04/24/2021 OT Individual Time: 6333-5456 and 2563-8937 OT Individual Time Calculation (min): 54 min and 43 min   Short Term Goals: Week 1:  OT Short Term Goal 1 (Week 1): patient will complete lower body bathing dressing at bed level with min A using assistive devices OT Short Term Goal 1 - Progress (Week 1): Progressing toward goal OT Short Term Goal 2 (Week 1): patient will complete upper body bathing and dressing w/c level with set up OT Short Term Goal 2 - Progress (Week 1): Met OT Short Term Goal 3 (Week 1): patient will sit unsupported for 10+ minutes with min A OT Short Term Goal 3 - Progress (Week 1): Met OT Short Term Goal 4 (Week 1): patient will complete SB transfers with mod A of one OT Short Term Goal 4 - Progress (Week 1): Met Week 2:  OT Short Term Goal 1 (Week 2): Pt will complete bathing shower level MIN A with AE PRN OT Short Term Goal 2 (Week 2): Pt will perform BSC/toilet transfers CGA with no more than occasional cues for form OT Short Term Goal 3 (Week 2): Pt will perform LB dressing bed level Min A and adapative equipement PRN OT Short Term Goal 4 (Week 2): Pt will consistently perform squat pivot transfers CGA to various surfaces in prep for ADL   Skilled Therapeutic Interventions/Progress Updates:    Session 1: Pt greeted at time of session up in wheelchair, no pain, ready for OT session. Declined ADL in favor of completing this afternoon. Discussion with pt throughout session regarding emotions with going home, facing unknowns, making a new routine, etc. Self propel room <> cafeteria with Mod I and managing purchasing coffee same manner. In ADL apartment, continued to problem solve thorugh home set up and practice TTB for future use as pt states he may be staying at different places but re-emphasized not to use home bathroom in  camper 2/2 poor set up and fit from w/c level. Performing TTB transfers with squat pivot Mod I. Sit <> stands at back of straight back chair to assess tone and spasticity regarding RLE function and able to take small steps back and forth 1x5 each LE and heavy WB through UE when moving LLE and having to weight bear on R. Back up in room hand off to nursing for cath.   Session 2: Pt greeted at time of session up in w/c ready for OT session, no pain. Pt performing ADL routine including shower transfer, UB/LB bathing, and UB/LB dressing at an overall Set up to Mod I level. Pt already picked out clothing from w/c level prior to session and only needing set up for wash cloths from high surface that would require standing. Dressing performed seated on bench in shower as well. Squat pivot to/from various surfaces Mod I. Pt up in w/c at end of session no pain and no further questions, ready for DC tomorrow.   Therapy Documentation Precautions:  Precautions Precautions: Fall Precaution Comments: paraparesis; check BP Other Brace: TED hose Restrictions Weight Bearing Restrictions: No    Therapy/Group: Individual Therapy  [name] 04/24/2021, 7:18 AM

## 2021-04-24 NOTE — Progress Notes (Signed)
PROGRESS NOTE   Subjective/Complaints:  Still having night sweats- no significant change.  Tried ot put weight on RLE- causes leg to buckle still-  Also, spasticity/tightness is somewhat better in RLE- not as painful to extend.   Went over d/c plans- also need to lovenox- learning today- also knows and can do bowel program- wants wife to cath him, but has done a few times, "just in case'.   ROS:   Pt denies SOB, abd pain, CP, N/V/C/D, and vision changes   Objective:   No results found. Recent Labs    04/22/21 0718  WBC 4.2  HGB 13.2  HCT 40.5  PLT 189    No results for input(s): NA, K, CL, CO2, GLUCOSE, BUN, CREATININE, CALCIUM in the last 72 hours.    Intake/Output Summary (Last 24 hours) at 04/24/2021 1848 Last data filed at 04/24/2021 1835 Gross per 24 hour  Intake 720 ml  Output 1825 ml  Net -1105 ml     Pressure Injury 04/12/21 Heel Right Deep Tissue Pressure Injury - Purple or maroon localized area of discolored intact skin or blood-filled blister due to damage of underlying soft tissue from pressure and/or shear. Konrad Dolores RN  verification (Active)  04/12/21 1045  Location: Heel  Location Orientation: Right  Staging: Deep Tissue Pressure Injury - Purple or maroon localized area of discolored intact skin or blood-filled blister due to damage of underlying soft tissue from pressure and/or shear.  Wound Description (Comments): Konrad Dolores RN  verification  Present on Admission: Yes    Physical Exam: Vital Signs Blood pressure 125/86, pulse 81, temperature 98.1 F (36.7 C), resp. rate 16, height 5\' 11"  (1.803 m), weight 71.5 kg, SpO2 99 %.    General: awake, alert, appropriate, sitting up EOB eating; NAD HENT: conjugate gaze; oropharynx moist CV: regular rate; no JVD Pulmonary: CTA B/L; no W/R/R- good air movement GI: soft, NT, ND, (+)BS Psychiatric: appropriate Neurological: Ox3 MAS  of 1 in R knee- trace in R hip and ankle- no clonus Skin: Warm and dry.  Right heel DTI- almost resolved- looks very faint now Musc: No edema in extremities.  No tenderness in extremities. Neuro: Alert Decreased LT/pain sense aroung T5-6 with legs more affected than trunk, persistent Motor: LLE 1+/5 HF, KE, 1/5 ADF.  RLE: Flexion, knee extension 1+/5, ankle dorsiflexion 0/5  Assessment/Plan: 1. Functional deficits which require 3+ hours per day of interdisciplinary therapy in a comprehensive inpatient rehab setting. Physiatrist is providing close team supervision and 24 hour management of active medical problems listed below. Physiatrist and rehab team continue to assess barriers to discharge/monitor patient progress toward functional and medical goals  Care Tool:  Bathing    Body parts bathed by patient: Right arm, Left arm, Chest, Abdomen, Face, Front perineal area, Buttocks, Right upper leg, Left upper leg, Right lower leg, Left lower leg   Body parts bathed by helper: Right lower leg, Left lower leg Body parts n/a: Left lower leg, Right lower leg, Left upper leg, Right upper leg, Buttocks, Front perineal area   Bathing assist Assist Level: Set up assist     Upper Body Dressing/Undressing Upper body dressing   What  is the patient wearing?: Pull over shirt    Upper body assist Assist Level: Independent    Lower Body Dressing/Undressing Lower body dressing      What is the patient wearing?: Pants     Lower body assist Assist for lower body dressing: Independent with assitive device     Toileting Toileting Toileting Activity did not occur (Clothing management and hygiene only): N/A (no void or bm)  Toileting assist Assist for toileting: Independent with assistive device     Transfers Chair/bed transfer  Transfers assist     Chair/bed transfer assist level: Independent with assistive device Chair/bed transfer assistive device: Armrests    Locomotion Ambulation   Ambulation assist   Ambulation activity did not occur: Safety/medical concerns  Assist level: Minimal Assistance - Patient > 75% Assistive device: Parallel bars Max distance: 6 ft   Walk 10 feet activity   Assist  Walk 10 feet activity did not occur: Safety/medical concerns        Walk 50 feet activity   Assist Walk 50 feet with 2 turns activity did not occur: Safety/medical concerns         Walk 150 feet activity   Assist Walk 150 feet activity did not occur: Safety/medical concerns         Walk 10 feet on uneven surface  activity   Assist Walk 10 feet on uneven surfaces activity did not occur: Safety/medical concerns         Wheelchair     Assist Is the patient using a wheelchair?: Yes Type of Wheelchair: Manual    Wheelchair assist level: Independent Max wheelchair distance: 500+ ft    Wheelchair 50 feet with 2 turns activity    Assist        Assist Level: Independent   Wheelchair 150 feet activity     Assist      Assist Level: Independent   Blood pressure 125/86, pulse 81, temperature 98.1 F (36.7 C), resp. rate 16, height 5\' 11"  (1.803 m), weight 71.5 kg, SpO2 99 %. Medical Problem List and Plan: 1.   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  Antibiotic course completed..  Follow-up with ID as outpatient with immunologic work-up and viral serology  Con't PT and OT- d/c tomorrow- practiced car transfers- learned lovenox- advised to not miss appointments with me until find someone at [facility] for him  2.  Antithrombotics: -DVT/anticoagulation:  Pharmaceutical: Lovenox.   10/11- vascular study (-)  10/12- will need Lovenox for 3 months total due to risk of DVT being so high.   10/26- pt trained on lpovenox             -antiplatelet therapy: N/A 3. Pain Management: Tylenol/Advil as needed  Lidoderm patches- 3 of them 8pm to 8am- if not enough, might try tylenol #3.    Controlled with meds on 10/21 4. Mood: Provide emotional support             -antipsychotic agents: N/A 5. Neuropsych: This patient is capable of making decisions on his own behalf.  Appreciate neuropsych 6. Skin/Wound Care: Routine skin checks             -reiterated to pt that he needs to wear PRAFO's for heel protection and to stretch heel cords  Discussed with nursing size/fitting  10/26- almost healed- con't wound care- locally 7. Fluids/Electrolytes/Nutrition: Routine in and outs 8.  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  Keppra 500 mg twice daily 9.  Sinus bradycardia.  Echocardiogram unremarkable.  No further work-up indicated per cardiology services  Controlled on 10/21 10.  Neurogenic bladder/Urinary retention.  Failed voiding trial.               cath if needed- if volumes >300cc  Flomax increased on 10/15, monitor blood pressures  Flomax increased Started on Urecholine 10 mg TID on 10/20, will likely be increased UA/UC ordered 10/24- u/a SHOWS (+) NITRATE and >50 WBC- so will start Keflex 500 mg q 6 hours x 7 days- and wait for Cx 10/25- E Coli UTI- but resistant to PCN- will have pharmacy check if can con't Keflex- will let me know 10/26- total 7 days 11. Neurogenic bowel: begin scheduled senokot-s at [facility].             -continue miralax daily             -consider bowel program depending upon continence and regularity             -pt reports no sense of bowel emptying currently.  10/24- pt doing own bowel program per pt-  12. At level SCI pain  See #3 13.  Mild hyponatremia: Resolved 14.  Elevated BUN: Resolved 15. R heel DTI and blood blister- local wound care and wear PRAFOs when in bed esp to prevent DTI from opening possibly.  16. Developing spasticity-   10/26- baclofen as below 17. Sweating above level of lesion  Likely due to temp dysregulation, however patient also states that his significant other is sweating at night as well-he believes it is  the thermostat in his room  Asked  Lurena Joiner RN if she can find a fan for him as he uses athome.   10/25- will see if related to UTI  10/26- no change, so likely dysautonomia- con't to monitor 18.  Sleep disturbance  As needed melatonin added per patient request 19. Dry skin- ordered eucerin BID  10/26- can use at home 20. New spasticity of RLE  10/25- will add Baclofen 5 mg TID for spasticity- educated pt on reason he needs it.   10/26- spasticity looks better, but will affect his ability to stand, since likely was using tone to stand- went over with PT  Pt needs manual ultra light w/c- due to incomplete paraplegia and inability to walk- cannot use heavier w/c due to 100% of time in w/c- also needs for spasticity care overall- also needs pressure relieving cushion - ROHO for skin breakdown- DTI that he currently has.    I spent a total of 49 minutes on total care- more than 35 minutes on room with pt and then d/w PT and OT about locking knee brace/ordering- >50% coordination ofcare- discussing- Standing; spasticity education, why he needs to come back to see ME; and answering questions about d/c.    LOS: 18 days A FACE TO FACE EVALUATION WAS PERFORMED  Brielynn Sekula 04/24/2021, 6:48 PM

## 2021-04-24 NOTE — Progress Notes (Signed)
Inpatient Rehabilitation Care Coordinator Discharge Note   Patient Details  Name: James Obrien MRN: [id number] Date of Birth: 1985/10/08   Discharge location: Home [place], [place])  Length of Stay: 18 Days  Discharge activity level: Sup  Home/community participation: spouse and mother  Patient response OZ:YYQMGN Literacy - How often do you need to have someone help you when you read instructions, pamphlets, or other written material from your doctor or pharmacy?: Often  Patient response OI:BBCWUG Isolation - How often do you feel lonely or isolated from those around you?: Never  Services provided included: MD, RD, PT, OT, SLP, CM, RN, TR, Pharmacy, Neuropsych, SW  Financial Services:  Field seismologist Utilized: [employer]  Choices offered to/list presented to: patient  Follow-up services arranged:  Outpatient    Outpatient Servicies: [facility] in [place], [place]      Patient response to transportation need: Is the patient able to respond to transportation needs?: Yes In the past 12 months, has lack of transportation kept you from medical appointments or from getting medications?: No In the past 12 months, has lack of transportation kept you from meetings, work, or from getting things needed for daily living?: No    Comments (or additional information):  Patient/Family verbalized understanding of follow-up arrangements:  Yes  Individual responsible for coordination of the follow-up plan: Shanda Bumps [phone]  Confirmed correct DME delivered: Andria Rhein 04/24/2021    Andria Rhein

## 2021-04-24 NOTE — Progress Notes (Signed)
Orthopedic Tech Progress Note Patient Details:  [name] 1985-11-12 [id number]  Called in order to [employer] for a LOCKING KNEE BRACE  Patient ID: James Obrien, male   DOB: 1986/02/11, 35 y.o.   MRN: [id number]  [name] 04/24/2021, 10:12 AM

## 2021-04-24 NOTE — Progress Notes (Addendum)
Education Note: Patient provided return demonstration on Lovenox administration today, patient was successful but will re-demonstrate prior to discharge to ensure patient is able to handle needle in his hand properly. Patient provided return demonstration on proper hand hygiene and  I/O  Catheterization, patient successful.

## 2021-04-25 ENCOUNTER — Other Ambulatory Visit (HOSPITAL_COMMUNITY): Payer: Self-pay

## 2021-04-25 LAB — ARBOVIRUS PANEL, ~~LOC~~ LAB

## 2021-04-25 MED ORDER — ENOXAPARIN (LOVENOX) PATIENT EDUCATION KIT
PACK | Freq: Once | Status: AC
  Filled 2021-04-25: qty 1

## 2021-04-25 NOTE — Progress Notes (Signed)
Discharge Note:  Alert and oriented x4, compliant with medication administration. No complaints of pain. No signs or symptoms of distress noted. Patient accompanied by family. James Ina, Pa provided discharge instructions and reviewed medications. Staff assisted patient off unit and into private car free of complications. Patient discharged home.

## 2021-04-25 NOTE — Progress Notes (Signed)
PROGRESS NOTE   Subjective/Complaints:  Pt reports sweating is less- asking about getting cath supplies since won't get catheters for 3-5 business days per [employer]-   Was told order went in yesterday!  ROS:   Pt denies SOB, abd pain, CP, N/V/C/D, and vision changes   Objective:   No results found. No results for input(s): WBC, HGB, HCT, PLT in the last 72 hours.   No results for input(s): NA, K, CL, CO2, GLUCOSE, BUN, CREATININE, CALCIUM in the last 72 hours.    Intake/Output Summary (Last 24 hours) at 04/25/2021 0952 Last data filed at 04/25/2021 0700 Gross per 24 hour  Intake 720 ml  Output 2600 ml  Net -1880 ml     Pressure Injury 04/12/21 Heel Right Deep Tissue Pressure Injury - Purple or maroon localized area of discolored intact skin or blood-filled blister due to damage of underlying soft tissue from pressure and/or shear. Glenis Smoker RN  verification (Active)  04/12/21 1045  Location: Heel  Location Orientation: Right  Staging: Deep Tissue Pressure Injury - Purple or maroon localized area of discolored intact skin or blood-filled blister due to damage of underlying soft tissue from pressure and/or shear.  Wound Description (Comments): Glenis Smoker RN  verification  Present on Admission: Yes    Physical Exam: Vital Signs Blood pressure (!) 101/59, pulse 60, temperature (!) 97.5 F (36.4 C), temperature source Oral, resp. rate 16, height _0  (1.803 m), weight 71.5 kg, SpO2 98 %.     General: awake, alert, appropriate,sitting EOB eating; NAD HENT: conjugate gaze; oropharynx moist CV: regular rate; no JVD Pulmonary: CTA B/L; no W/R/R- good air movement GI: soft, NT, ND, (+)BS Psychiatric: appropriate Neurological: Ox3 MAS of 1 in R knee- trace in R hip and ankle- no clonus- no change Skin: Warm and dry.  Right heel DTI- almost resolved- looks very faint now Musc: No edema in extremities.   No tenderness in extremities. Neuro: Alert Decreased LT/pain sense aroung T5-6 with legs more affected than trunk, persistent Motor: LLE 1+/5 HF, KE, 1/5 ADF.  RLE: Flexion, knee extension 1+/5, ankle dorsiflexion 0/5  Assessment/Plan: 1. Functional deficits which require 3+ hours per day of interdisciplinary therapy in a comprehensive inpatient rehab setting. Physiatrist is providing close team supervision and 24 hour management of active medical problems listed below. Physiatrist and rehab team continue to assess barriers to discharge/monitor patient progress toward functional and medical goals  Care Tool:  Bathing    Body parts bathed by patient: Right arm, Left arm, Chest, Abdomen, Face, Front perineal area, Buttocks, Right upper leg, Left upper leg, Right lower leg, Left lower leg   Body parts bathed by helper: Right lower leg, Left lower leg Body parts n/a: Left lower leg, Right lower leg, Left upper leg, Right upper leg, Buttocks, Front perineal area   Bathing assist Assist Level: Set up assist     Upper Body Dressing/Undressing Upper body dressing   What is the patient wearing?: Pull over shirt    Upper body assist Assist Level: Independent    Lower Body Dressing/Undressing Lower body dressing      What is the patient wearing?: Pants  Lower body assist Assist for lower body dressing: Independent with assitive device     Toileting Toileting Toileting Activity did not occur (Clothing management and hygiene only): N/A (no void or bm)  Toileting assist Assist for toileting: Independent with assistive device     Transfers Chair/bed transfer  Transfers assist     Chair/bed transfer assist level: Independent with assistive device Chair/bed transfer assistive device: Armrests   Locomotion Ambulation   Ambulation assist   Ambulation activity did not occur: Safety/medical concerns  Assist level: Minimal Assistance - Patient > 75% Assistive device:  Parallel bars Max distance: 6 ft   Walk 10 feet activity   Assist  Walk 10 feet activity did not occur: Safety/medical concerns        Walk 50 feet activity   Assist Walk 50 feet with 2 turns activity did not occur: Safety/medical concerns         Walk 150 feet activity   Assist Walk 150 feet activity did not occur: Safety/medical concerns         Walk 10 feet on uneven surface  activity   Assist Walk 10 feet on uneven surfaces activity did not occur: Safety/medical concerns         Wheelchair     Assist Is the patient using a wheelchair?: Yes Type of Wheelchair: Manual    Wheelchair assist level: Independent Max wheelchair distance: 500+ ft    Wheelchair 50 feet with 2 turns activity    Assist        Assist Level: Independent   Wheelchair 150 feet activity     Assist      Assist Level: Independent   Blood pressure (!) 101/59, pulse 60, temperature (!) 97.5 F (36.4 C), temperature source Oral, resp. rate 16, height _0  (1.803 m), weight 71.5 kg, SpO2 98 %. Medical Problem List and Plan: 1.   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  Antibiotic course completed..  Follow-up with ID as outpatient with immunologic work-up and viral serology  Con't PT and OT- d/c tomorrow- practiced car transfers- learned lovenox- advised to not miss appointments with me until find someone at [facility] for him   -d/c today- get him lovenox education kit for disposal of needles 2.  Antithrombotics: -DVT/anticoagulation:  Pharmaceutical: Lovenox.   10/11- vascular study (-)  10/12- will need Lovenox for 3 months total due to risk of DVT being so high.   10/26- pt trained on lpovenox  10/27- will get disposal for syringes             -antiplatelet therapy: N/A 3. Pain Management: Tylenol/Advil as needed  Lidoderm patches- 3 of them 8pm to 8am- if not enough, might try tylenol #3.   Controlled with meds on 10/21 4. Mood:  Provide emotional support             -antipsychotic agents: N/A 5. Neuropsych: This patient is capable of making decisions on his own behalf.  Appreciate neuropsych 6. Skin/Wound Care: Routine skin checks             -reiterated to pt that he needs to wear PRAFO's for heel protection and to stretch heel cords  Discussed with nursing size/fitting  10/26- almost healed- con't wound care- locally 7. Fluids/Electrolytes/Nutrition: Routine in and outs 8.  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  Keppra 500 mg twice daily 9.  Sinus bradycardia.  Echocardiogram unremarkable.  No further work-up indicated per cardiology services  Controlled on 10/21 10.  Neurogenic bladder/Urinary retention.  Failed voiding trial.               cath if needed- if volumes >300cc  Flomax increased on 10/15, monitor blood pressures  Flomax increased Started on Urecholine 10 mg TID on 10/20, will likely be increased UA/UC ordered 10/24- u/a SHOWS (+) NITRATE and >50 WBC- so will start Keflex 500 mg q 6 hours x 7 days- and wait for Cx 10/25- E Coli UTI- but resistant to PCN- will have pharmacy check if can con't Keflex- will let me know 10/26- total 7 days 11. Neurogenic bowel: begin scheduled senokot-s at HS.             -continue miralax daily             -consider bowel program depending upon continence and regularity             -pt reports no sense of bowel emptying currently.  10/24- pt doing own bowel program per pt-  12. At level SCI pain  See #3 13.  Mild hyponatremia: Resolved 14.  Elevated BUN: Resolved 15. R heel DTI and blood blister- local wound care and wear PRAFOs when in bed esp to prevent DTI from opening possibly.  16. Developing spasticity-   10/26- baclofen as below 17. Sweating above level of lesion  Likely due to temp dysregulation, however patient also states that his significant other is sweating at night as well-he believes it is the thermostat in his room  Asked  Wells Guiles  RN if she can find a fan for him as he uses athome.   10/25- will see if related to UTI  10/26- no change, so likely dysautonomia- con't to monitor 18.  Sleep disturbance  As needed melatonin added per patient request 19. Dry skin- ordered eucerin BID  10/26- can use at home 20. New spasticity of RLE  10/25- will add Baclofen 5 mg TID for spasticity- educated pt on reason he needs it.   10/26- spasticity looks better, but will affect his ability to stand, since likely was using tone to stand- went over with PT 21. Dispo  10/27- needs handicapped placard- also d/w nursing to get pt catheters for d/c.   Pt needs manual ultra light w/c- due to incomplete paraplegia and inability to walk- cannot use heavier w/c due to 100% of time in w/c- also needs for spasticity care overall- also needs pressure relieving cushion - ROHO for skin breakdown- DTI that he currently has.    I spent a total of 32 minute son total discharge- >50% coordination of care- educating on lovenopx- getting lovenox education kit; and calling SW about handicapped placard.   LOS: 19 days A FACE TO FACE EVALUATION WAS PERFORMED  Chaos Carlile 04/25/2021, 9:52 AM

## 2021-04-29 ENCOUNTER — Other Ambulatory Visit: Payer: Self-pay

## 2021-04-29 ENCOUNTER — Encounter (HOSPITAL_COMMUNITY): Payer: Self-pay | Admitting: Radiology

## 2021-04-29 ENCOUNTER — Telehealth: Payer: Self-pay | Admitting: Pharmacist

## 2021-04-29 ENCOUNTER — Telehealth: Payer: Self-pay

## 2021-04-29 NOTE — Telephone Encounter (Signed)
Transition Care Management Unsuccessful Follow-up Telephone Call  Date of discharge and from where:  04/25/21 [name]  Attempts:  1st Attempt  Reason for unsuccessful TCM follow-up call:  Left voice message on [name](spouse) VM

## 2021-04-29 NOTE — Telephone Encounter (Signed)
Pharmacy Transitions of Care Follow-up Telephone Call  Date of discharge: 04/23/21  Discharge Diagnosis: Myelitis    Medication changes made at discharge:  START taking: Baclofen bethanechol (URECHOLINE) bisacodyl (DULCOLAX) cephALEXin (KEFLEX) enoxaparin (LOVENOX) lidocaine (LIDODERM) melatonin polyethylene glycol (MIRALAX / GLYCOLAX) senna-docusate (Senokot-S) CHANGE how you take: acetaminophen (TYLENOL) tamsulosin (FLOMAX)  Medication changes verified by the patient? Yes    Medication Accessibility:  Home Pharmacy: [facility] on [address] in [place].   Was the patient provided with refills on discharged medications? Yes, 2 refills.   Have all prescriptions been transferred from [facility] to home pharmacy? No  Is the patient able to afford medications? Pt has ins and likes to go to [employer].    Medication Review:  ENOXAPARIN - Inject 1 syringe (40mg ) subq once daily for DVT px.  Follow-up Appointments:  Follow up with , MD Specialty: Physical Medicine and Rehabilitation Office to call for appointment [address] [place] [phone] Got a message on MyChart saying that he has a follow up apt on 05/07/21  Follow up with 13/8/22, MD Specialty: Infectious Diseases Call for appointment [address] [place] [phone]  Final Patient Assessment: Has been doing well on medications and understands how to take his meds correctly. Has scheduled follow up apt on 05/07/21. Had questions about how to get refills and side effects associated with medications. Educated pt on correct way to transfer meds and that during f/u apt the provider should issue refills for medications. Educated pt on side effects of medications and how to mitigate and avoid them.

## 2021-04-30 LAB — FUNGAL ORGANISM REFLEX

## 2021-04-30 LAB — FUNGUS CULTURE WITH STAIN

## 2021-04-30 LAB — FUNGUS CULTURE RESULT

## 2021-05-02 NOTE — Progress Notes (Shared)
Patient ID: James Obrien, male   DOB: [birth year], 35 y.o.   MRN: [id number]  Myelitis [facility]) G04.91   Neurogenic bowel K59.2   Neurogenic bladder N31.9     It is medically necessary for pt to perform with 16FR straight catheters. Needed up to 4xdaily due to this pts anatomy and inability to pass a straight tip. This is due to pt indefinite urinary rentention, fully empty the bladder and keep pt from urinary infection.

## 2021-05-07 ENCOUNTER — Other Ambulatory Visit (HOSPITAL_COMMUNITY): Payer: Self-pay

## 2021-05-07 ENCOUNTER — Encounter: Payer: 59 | Attending: Registered Nurse | Admitting: Registered Nurse

## 2021-05-07 ENCOUNTER — Other Ambulatory Visit: Payer: Self-pay

## 2021-05-07 ENCOUNTER — Encounter: Payer: Self-pay | Admitting: Registered Nurse

## 2021-05-07 VITALS — BP 128/85 | HR 69 | Temp 97.7°F | Ht 71.0 in | Wt 156.4 lb

## 2021-05-07 DIAGNOSIS — R569 Unspecified convulsions: Secondary | ICD-10-CM | POA: Insufficient documentation

## 2021-05-07 DIAGNOSIS — G0491 Myelitis, unspecified: Secondary | ICD-10-CM | POA: Diagnosis present

## 2021-05-07 DIAGNOSIS — K592 Neurogenic bowel, not elsewhere classified: Secondary | ICD-10-CM | POA: Diagnosis present

## 2021-05-07 DIAGNOSIS — N319 Neuromuscular dysfunction of bladder, unspecified: Secondary | ICD-10-CM | POA: Diagnosis present

## 2021-05-07 MED ORDER — BISACODYL 10 MG RE SUPP: 10.0000 mg | Freq: Every day | RECTAL | 0 refills | Status: AC

## 2021-05-07 MED ORDER — BACLOFEN 5 MG PO TABS: 5.0000 mg | ORAL_TABLET | Freq: Three times a day (TID) | ORAL | 0 refills | Status: DC

## 2021-05-07 MED ORDER — SENNOSIDES-DOCUSATE SODIUM 8.6-50 MG PO TABS: 2.0000 | ORAL_TABLET | Freq: Every day | ORAL | 1 refills | Status: DC

## 2021-05-07 NOTE — Progress Notes (Signed)
Subjective:    Patient ID: James Obrien, male    DOB: [birth year], 35 y.o.   MRN: [id number]  HPI: James Obrien is a 35 y.o. male who is here for HFU appointment for follow up of his Myelitis, Neurogenic Bladder, Neurogenic Bowel and Seizure.  He presented to [name] on 03/28/2021 via EMS for New Onset Seizure.  HPI: Dr. Artis Flock HPI: James Obrien is a 35 y.o. male with no medical history. He states he was at work today and around 9:00Am started to have tingling and numbness in his left hand. Within one hour he had numerous episodes of tingling, shaking, twitching, weakness and loss of control of his left hand. He went to tell his boss who called someone to come help. He thinks around 12:00 he was waiting and all he remembers is shaking all over and falling. Per EDP note who talked to a colleague, they witnessed a generalized tonic-clonic like seizure activity that lasted 2-5 minutes. He woke up in the ambulance. He bit his tongue, but doesn't think he had any urinary loss.  He states he was drowsy, but not confused. No headache. In the ambulance he had another witnessed full tonic clonic seizure and all he remembers is waking up in the hospital. He was given IM 5mg  versed. He has a mild headache now, but otherwise feels okay. He still has residual left sided weakness. He is left handed.   CT Head WO Contrast:  IMPRESSION: 1. No acute intracranial abnormalities. 2. Air-fluid level within the left maxillary sinus.  MR Brain WO Contrast:  IMPRESSION: 1. Multifocal abnormal hyperintense T2-weighted signal within both hemispheres, but greatest at the right pre and postcentral gyri. The appearance is concerning for acute demyelination. Lymphoma is a secondary possibility. Postcontrast imaging is recommended as an adjunct to this study, for further evaluation.   MR Thoracic:  IMPRESSION: 1. Longitudinally extensive cord signal abnormality/myelitis. 2. No hematoma or other  complication related to lumbar puncture.   MR Lumbar:  IMPRESSION: 1. Longitudinally extensive cord signal abnormality/myelitis. 2. No hematoma or other complication related to lumbar puncture.  EEG:   IMPRESSION: This study is suggestive of cortical dysfunction arising from left hemisphere, nonspecific etiology but could be secondary to underlying structural abnormality. Additionally there is mild diffuse encephalopathy, nonspecific etiology. No seizures or epileptiform discharges were seen throughout the recording.  Neurology Consulted.  James Obrien was admitted to inpatient rehabilitation on 04/06/2021 and discharge on 04/25/2021. He is receiving outpatient therapy at [facility].  He denies any pain at this time. He rated his pain 1.   James Obrien states he self-cath three times a day.       Pain Inventory Average Pain 3 Pain Right Now 1 My pain is intermittent, sharp, aching, tingling  LOCATION OF PAIN  Back, legs  BOWEL Number of stools per week: 7 per week Oral laxative use No   BLADDER  In and out cath, frequency 3 tines daily Able to self cath Yes    Mobility walk with assistance ability to climb steps?  no do you drive?  no use a wheelchair transfers alone Do you have any goals in this area?  yes  Function disabled: date disabled 02/2021 I need assistance with the following:  household duties and shopping Do you have any goals in this area?  yes  Neuro/Psych weakness tingling trouble walking  Prior Studies Any changes since last visit?  no, New Patient  Physicians involved  in your care Any changes since last visit?  no, New Patient   Family History  Problem Relation Age of Onset   Seizures Neg Hx    Social History   Socioeconomic History   Marital status: Married    Spouse name: Not on file   Number of children: Not on file   Years of education: Not on file   Highest education level: Not on file  Occupational  History   Not on file  Tobacco Use   Smoking status: Every Day    Types: Cigarettes   Smokeless tobacco: Never  Vaping Use   Vaping Use: Never used  Substance and Sexual Activity   Alcohol use: Yes    Comment: occ   Drug use: Never   Sexual activity: Not on file  Other Topics Concern   Not on file  Social History Narrative   Only one in family who works.   Social Determinants of Health   Financial Resource Strain: Not on file  Food Insecurity: Not on file  Transportation Needs: Not on file  Physical Activity: Not on file  Stress: Not on file  Social Connections: Not on file   Past Surgical History:  Procedure Laterality Date   NO PAST SURGERIES     Past Medical History:  Diagnosis Date   Medical history non-contributory    Ht 5\' 11"  (1.803 m)   Wt 156 lb 6.4 oz (70.9 kg)   BMI 21.81 kg/m   Opioid Risk Score:   Fall Risk Score:  `1  Depression screen PHQ 2/9  No flowsheet data found.  Review of Systems  Musculoskeletal:  Positive for back pain and gait problem.  Neurological:        Tingling  All other systems reviewed and are negative.     Objective:   Physical Exam Vitals and nursing note reviewed.  Constitutional:      Appearance: Normal appearance.  Cardiovascular:     Rate and Rhythm: Normal rate and regular rhythm.     Pulses: Normal pulses.     Heart sounds: Normal heart sounds.  Pulmonary:     Effort: Pulmonary effort is normal.     Breath sounds: Normal breath sounds.  Musculoskeletal:     Cervical back: Normal range of motion and neck supple.     Comments: Normal Muscle Bulk and Muscle Testing Reveals:  Upper Extremities: Full ROM and Muscle Strength 5/5 Lower Extremities : Right Lower Extremity: Decreased ROM  and Muscle Strength 5/5 Left Lower Extremity : Full ROM and Muscle Strength 5/5 Arrived in wheelchair    Skin:    General: Skin is warm and dry.  Neurological:     Mental Status: He is alert and oriented to person, place,  and time.  Psychiatric:        Mood and Affect: Mood normal.        Behavior: Behavior normal.         Assessment & Plan:  Myelitis: Has a scheduled appointment with ID. Continue to Monitor.  Neurogenic Bladder: James Obrien states he is self cath 3 times a day. We will continue to monitor.  ,Neurogenic Bowel : Mr. Murad was encouraged to continue the bowel program, he verbalizes understanding. Continue to monitor.  Seizure. Continue Keppra. He has a Neurology F/U appointment. Continue to monitor.  F/U with Dr Darcel Smalling in 4- 6 weeks

## 2021-05-07 NOTE — Patient Instructions (Addendum)
Call Dr Staci Righter  Infectious Disease [address] [place], [place] [address] [phone]     Call to schedule an appointment with Primary Care

## 2021-05-14 ENCOUNTER — Encounter: Payer: Self-pay | Admitting: Neurology

## 2021-05-14 ENCOUNTER — Ambulatory Visit (INDEPENDENT_AMBULATORY_CARE_PROVIDER_SITE_OTHER): Payer: 59 | Admitting: Neurology

## 2021-05-14 ENCOUNTER — Other Ambulatory Visit: Payer: Self-pay

## 2021-05-14 VITALS — BP 138/67 | HR 75 | Ht 71.0 in | Wt 154.0 lb

## 2021-05-14 DIAGNOSIS — A549 Gonococcal infection, unspecified: Secondary | ICD-10-CM | POA: Diagnosis not present

## 2021-05-14 DIAGNOSIS — R569 Unspecified convulsions: Secondary | ICD-10-CM | POA: Diagnosis not present

## 2021-05-14 DIAGNOSIS — G0490 Encephalitis and encephalomyelitis, unspecified: Secondary | ICD-10-CM

## 2021-05-14 DIAGNOSIS — G0491 Myelitis, unspecified: Secondary | ICD-10-CM | POA: Diagnosis not present

## 2021-05-14 DIAGNOSIS — N319 Neuromuscular dysfunction of bladder, unspecified: Secondary | ICD-10-CM

## 2021-05-14 DIAGNOSIS — R208 Other disturbances of skin sensation: Secondary | ICD-10-CM

## 2021-05-14 DIAGNOSIS — K592 Neurogenic bowel, not elsewhere classified: Secondary | ICD-10-CM

## 2021-05-14 MED ORDER — SILODOSIN 8 MG PO CAPS: 8.0000 mg | ORAL_CAPSULE | Freq: Every day | ORAL | 11 refills | Status: DC

## 2021-05-14 MED ORDER — LEVETIRACETAM 500 MG PO TABS: 500.0000 mg | ORAL_TABLET | Freq: Two times a day (BID) | ORAL | 5 refills | Status: DC

## 2021-05-14 NOTE — Addendum Note (Signed)
Addended by: Despina Arias A on: 05/14/2021 09:18 PM   Modules accepted: Level of Service

## 2021-05-14 NOTE — Progress Notes (Addendum)
[facility]  PATIENT: James Obrien DOB: 06/13/1986  REFERRING DOCTOR OR PCP: Lauraine Rinne PA-C SOURCE:, Extensive notes from long hospital stay, lab reports, imaging reports, MRI images personally reviewed.  _________________________________   HISTORICAL  CHIEF COMPLAINT:  Chief Complaint  Patient presents with   New Patient (Initial Visit)    Rm 2, alone. Internal referral for myelitis and demyelination seen on MRI. Pt reports having a sz at work and then a 2nd while being transported to the ED. Was seen on 9/29-10/27. Pt reports no sz since. Taking Keppra. Pt ambulates in WC. Tenderness in legs.     HISTORY OF PRESENT ILLNESS:  I had the pleasure of seeing your patient, James Obrien, at [facility] for neurologic consultation regarding his transverse myelitis and abnormal brain MRI.  He is a 35 year old man in generally good health who had a seizure (generalized tonic clonic) on 03/28/2021.   The morning of the seizure seemed normal.  He had no cognitive issues, weakness or numbness while at work and was walking well.     As the day proceeded he had several 20-30 second episodes of twitching and numbness of the left hand.   After a few events, another employee was called over and then he had twitching in the hand again and also numbness that went to the arm to the face.   He then recalls falling to the ground and the other person yelling to call for an ambulance.  He then apparently had a witnessed generalized tonic-clonic seizure lasting a couple minutes.  An ambulance was called and he had a second GTC in the ambulance.  He presented to [facility] ED.   In the emergency room, he received IV Ativan and Keppra and had no further seizures.  The MRI of the brain was abnormal showing a large right hemispheric and smaller subcortical and deep white matter foci and a couple foci in the pons.  They had patchy enhancement.     He does not recall  the next couple of days but reports being told that he became less confused.  Upon being more alert, he was noted to have complete weakness in his legs.  He had a Foley catheter but was later found to also have urinary retention.  Due to the weakness he had imaging of the lumbar thoracic spine 03/30/2021.  It showed longitudinally extensive patchy lesions in the central spinal cord from at least C7-T10 (the entire cervical spine without evidence) explaining his weakness and bladder dysfunction.  He received 3 days of IV Solu-Medrol from 03/30/2021 to 04/01/2021.  While in the hospital he also had an EEG that showed l asymmetric cortical dysfunction.  He improved and on 04/06/2021 was transferred to rehab for a couple weeks (Dr. Dagoberto Ligas).  He was discharged and is doing outpatient therapy closer to his home in [place].  Of note, a few days before his seizure, he was placed on doxycycline for cellulitis of the groin and some lesions on his penis.     He had presented to the ED afte he noted swelling in the groin.   He though he had a spider bite.  He received IV ceftriaxone and was discharged on p.o. doxycycline.      Compared to last month, he feels mental status is normal, gait is improving.   He has no more episode of twitching or seizure.    He needs to self-catheterize and use suppositories to evacuate.  He is on tamsulosin and bethanechol but still needs to do self-catheterization.  He has painful dysesthesias in his legs with allodynia.       He has a pressure sore on the right heel.     The right arm is strong and the left arm is just very minimally weak now.    His left leg is weaker than the right leg.   He can take some steps without support but does better with a walker or cane.   He gets painful lower back spasms.   He takes baclofen 5 mg po tid.    He has no further seizure.   He is on Keppra 500 mg po bid and tolerates it well.  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  Imaging studies: MRI of the brain 03/28/2021 shows a large focus in the posterior frontal anterior parietal lobe (pre and postcentral gyrus) with patchy enhancement.  Additional enhancing foci  is noted in the left occipital lobe , and in the pons  MRI of the thoracic spine 03/30/2021 shows a longitudinal extensive central T2 hyperintense lesion extending from at least T3-T11.  There could also be patchy T2 hyperintensity in the upper thoracic and lower cervical cord  Laboratory test: Anti-NMO IgG negative HIV-1 RNA negative   HIV Ab negative   Neisseria Gonorrhea positive ACE = 30 (normal) CRP and ESR normal Covid-19 RT-PCR  negative  CSF 03/29/2021  104 WBC (20% neutrophils; 64% lymphs; 13% mono; 2% eosinophils); Glucose=55 9normal); protein = 109 (elevated); crypto negative CSF HSV 1/2 negative' VZV PCR negative; VDRL negative    REVIEW OF SYSTEMS:  Constitutional: No fevers, chills, sweats, or change in appetite Eyes: No visual changes, double vision, eye pain Ear, nose and throat: No hearing loss, ear pain, nasal congestion, sore throat Cardiovascular: No chest pain, palpitations Respiratory:  No shortness of breath at rest or with exertion.   No wheezes GastrointestinaI: He has severe constipation and needs to use suppositories. Genitourinary: He has urinary retention and does self intermittent catheterization Musculoskeletal:  No neck pain, back pain Integumentary: No rash, pruritus, skin lesions Neurological: as above Psychiatric: No depression at this time.  No anxiety Endocrine: No palpitations, diaphoresis, change in appetite, change in weigh or increased thirst Hematologic/Lymphatic:  No anemia, purpura, petechiae. Allergic/Immunologic: No itchy/runny eyes, nasal congestion, recent allergic reactions, rashes  ALLERGIES: No Known Allergies  HOME MEDICATIONS:  Current Outpatient Medications:    acetaminophen (TYLENOL) 325 MG tablet, Take 2  tablets (650 mg total) by mouth every 6 (six) hours as needed for mild pain (or Fever >/= 101)., Disp: , Rfl:    Baclofen 5 MG TABS, Take 1 tablet (5 mg) by mouth 3 (three) times daily., Disp: 90 tablet, Rfl: 0   bethanechol (URECHOLINE) 10 MG tablet, Take 1 tablet (10 mg total) by mouth 3 (three) times daily., Disp: 21 tablet, Rfl: 0   bisacodyl (DULCOLAX) 10 MG suppository, Place 1 suppository (10 mg total) rectally daily at 6 PM., Disp: 12 suppository, Rfl: 0   cephALEXin (KEFLEX) 500 MG capsule, Take 1 capsule (500 mg total) by mouth every 6 (six) hours., Disp: 24 capsule, Rfl: 0   enoxaparin (LOVENOX) 40 MG/0.4ML injection, Inject 1 syringe (44mg)  subcutaneously once daily, Disp: 12 mL, Rfl: 2   ibuprofen (ADVIL) 600 MG tablet, Take 1 tablet (600 mg total) by mouth every 6 (six) hours as needed for fever, mild pain or headache., Disp:  30 tablet, Rfl: 0   lidocaine (LIDODERM) 5 %, Place 3 patches onto the skin daily. Remove & Discard patch within 12 hours or as directed by MD, Disp: 60 patch, Rfl: 0   melatonin 3 MG TABS tablet, Take 1 tablet (3 mg total) by mouth at bedtime., Disp: 30 tablet, Rfl: 0   polyethylene glycol (MIRALAX / GLYCOLAX) 17 g packet, Take 17 g by mouth daily., Disp: 14 each, Rfl: 0   senna-docusate (SENOKOT-S) 8.6-50 MG tablet, Take 2 tablets by mouth at bedtime., Disp: 60 tablet, Rfl: 1   silodosin (RAPAFLO) 8 MG CAPS capsule, Take 1 capsule (8 mg total) by mouth daily with breakfast., Disp: 30 capsule, Rfl: 11   levETIRAcetam (KEPPRA) 500 MG tablet, Take 1 tablet (500 mg total) by mouth 2 (two) times daily., Disp: 60 tablet, Rfl: 5  PAST MEDICAL HISTORY: Past Medical History:  Diagnosis Date   Medical history non-contributory     PAST SURGICAL HISTORY: Past Surgical History:  Procedure Laterality Date   NO PAST SURGERIES      FAMILY HISTORY: Family History  Problem Relation Age of Onset   Seizures Neg Hx     SOCIAL HISTORY:  Social History    Socioeconomic History   Marital status: Married    Spouse name: Not on file   Number of children: Not on file   Years of education: Not on file   Highest education level: Not on file  Occupational History   Not on file  Tobacco Use   Smoking status: Former    Types: Cigarettes   Smokeless tobacco: Never  Vaping Use   Vaping Use: Never used  Substance and Sexual Activity   Alcohol use: Yes    Comment: occ   Drug use: Never   Sexual activity: Not on file  Other Topics Concern   Not on file  Social History Narrative   Only one in family who works.   Social Determinants of Health   Financial Resource Strain: Not on file  Food Insecurity: Not on file  Transportation Needs: Not on file  Physical Activity: Not on file  Stress: Not on file  Social Connections: Not on file  Intimate Partner Violence: Not on file     PHYSICAL EXAM  Vitals:   05/14/21 1302  BP: 138/67  Pulse: 75  Weight: 154 lb (69.9 kg)  Height: '5\' 11"'  (1.803 m)    Body mass index is 21.48 kg/m.   General: The patient is well-developed and well-nourished and in no acute distress  HEENT:  Head is [place]/AT.  Sclera are anicteric.  Funduscopic exam shows normal optic discs and retinal vessels.  Neck: No carotid bruits are noted.  The neck is nontender.  Cardiovascular: The heart has a regular rate and rhythm with a normal S1 and S2. There were no murmurs, gallops or rubs.    Skin: Extremities are without rash or  edema.  Musculoskeletal:  Back is nontender  Neurologic Exam  Mental status: The patient is alert and oriented x 3 at the time of the examination. The patient has apparent normal recent and remote memory, with an apparently normal attention span and concentration ability.   Speech is normal.  Cranial nerves: Extraocular movements are full. Pupils are equal, round, and reactive to light and accomodation.  Visual fields are full.  Facial symmetry is present. There is good facial sensation  to soft touch bilaterally.Facial strength is normal.  Trapezius and sternocleidomastoid strength is normal. No dysarthria is  noted.  The tongue is midline, and the patient has symmetric elevation of the soft palate. No obvious hearing deficits are noted.  Motor:  Muscle bulk is normal.   Tone is normal. Strength is  5 / 5 in the right arm and 5/5 except triceps 4= in left arm.   His legs are 4 in feet/ankle, 4+ iliopsoas and 4+ to 5 other muscles.   Sensory: Sensory testing is intact to pinprick, soft touch and vibration sensation in arms.  He reprots decreased touch/temperature in legs and fairly normal vibration sense.   .  Coordination: Cerebellar testing reveals good finger-nose-finger and heel-to-shin bilaterally.  Gait and station: He is able to stand up from the wheelchair using his hands and can take steps that are wide and spastic and ataxic.  He is unable to tandem walk.  Romberg is borderline.  Reflexes: Deep tendon reflexes are symmetric and normal in arms and increased with spread at the knees and clonus at left ankle.   Plantar responses are equivocal right and extensor left.    DIAGNOSTIC DATA (LABS, IMAGING, TESTING) - I reviewed patient records, labs, notes, testing and imaging myself where available.  Lab Results  Component Value Date   WBC 4.2 04/22/2021   HGB 13.2 04/22/2021   HCT 40.5 04/22/2021   MCV 94.2 04/22/2021   PLT 189 04/22/2021      Component Value Date/Time   NA 139 04/15/2021 1405   K 3.8 04/15/2021 1405   CL 102 04/15/2021 1405   CO2 30 04/15/2021 1405   GLUCOSE 96 04/15/2021 1405   BUN 15 04/15/2021 1405   CREATININE 0.96 04/15/2021 1405   CALCIUM 9.3 04/15/2021 1405   PROT 7.3 04/08/2021 0620   ALBUMIN 3.5 04/08/2021 0620   AST 32 04/08/2021 0620   ALT 63 (H) 04/08/2021 0620   ALKPHOS 38 04/08/2021 0620   BILITOT 1.1 04/08/2021 0620   GFRNONAA >60 04/15/2021 1405       ASSESSMENT AND PLAN  Myelitis ([place]) - Plan: MR BRAIN W WO  CONTRAST, MR CERVICAL SPINE W WO CONTRAST, MR THORACIC SPINE W WO CONTRAST, ANCA Profile, ANA w/Reflex  Seizure ([place])  Encephalitis - Plan: MR BRAIN W WO CONTRAST, MR CERVICAL SPINE W WO CONTRAST, MR THORACIC SPINE W WO CONTRAST, ANCA Profile, ANA w/Reflex  Gonorrhea  Neurogenic bowel  Neurogenic bladder  Dysesthesia  In summary, James Obrien is a 35 year old man with generalized tonic-clonic seizures associated with patchy enhancing lesions in the brain that were shortly followed by a extensive transverse myelitis causing bilateral leg weakness and urinary retention.  Although he is still having much difficulty with his gait, he has improved and is able to take some steps without support and can go further with a rolling walker.  Hopefully he will continue to improve.  He has had no further seizures.  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  A more definite etiology was not discovered.  I will check ANA and ANCA to look for the possibility of vasculitis.  We need to recheck an MRI of the brain and cervical and thoracic spine.  He lives in [place] but will be back in town in about 2 weeks for an infectious disease office visit and we will try to see if we can arrange imaging before or after that visit to avoid excessive travel.  If we are unable to, he could have a  study closer to home but that would make comparison with his previous MRI difficult.  I am going to have him change from tamsulosin to 8 mg Rapaflo to see if he does better with a different medication.  If the painful dysesthesias worsen we will add lamotrigine which is often helpful for spinal cord related pain.  I will see him back for visit in 3 months or sooner if he has new or worsening neurologic symptoms.  He is advised to call us with changes in symptoms.  105 -minute office visit with the majority of the time spent  face-to-face for history and physical, discussion/counseling and decision-making.  Additional time with record review and documentation.    Karishma Unrein A. Felecia Shelling, MD, [facility] 21/05/5519, 8:02 PM Certified in Neurology, Clinical Neurophysiology, Sleep Medicine and Neuroimaging  [facility] [address], [place], [place] [address] [phone]

## 2021-05-16 LAB — ANCA PROFILE
Anti-MPO Antibodies: <0.2 units (ref 0.0–0.9)
Anti-PR3 Antibodies: <0.2 units (ref 0.0–0.9)
Atypical pANCA: <1:20 {titer}
C-ANCA: <1:20 {titer}
P-ANCA: <1:20 {titer}

## 2021-05-16 LAB — ANA W/REFLEX: Anti Nuclear Antibody (ANA): NEGATIVE

## 2021-05-20 ENCOUNTER — Encounter: Payer: Self-pay | Admitting: Neurology

## 2021-05-27 ENCOUNTER — Other Ambulatory Visit (HOSPITAL_COMMUNITY): Payer: Self-pay

## 2021-05-27 ENCOUNTER — Other Ambulatory Visit: Payer: Self-pay

## 2021-05-27 ENCOUNTER — Ambulatory Visit (INDEPENDENT_AMBULATORY_CARE_PROVIDER_SITE_OTHER): Payer: 59 | Admitting: Internal Medicine

## 2021-05-27 ENCOUNTER — Encounter: Payer: Self-pay | Admitting: Internal Medicine

## 2021-05-27 VITALS — BP 127/90 | HR 75 | Temp 97.9°F

## 2021-05-27 DIAGNOSIS — G0491 Myelitis, unspecified: Secondary | ICD-10-CM

## 2021-05-27 NOTE — Progress Notes (Signed)
   Subjective:    Patient ID: James Obrien, male    DOB: Aug 11, 1985, 35 y.o.   MRN: [id number]  HPI He is here for hospital follow up He was hospitalized in October for weakness and confusion after a seizure and found to have myelitis with paralysis of his lower extremities.  He was diagnosed with gonorrhea and treated and started on steroids at the time for treatment of myelitis.  He underwent an extensive work up and no etiology noted.  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  He was in rehab for an extended period and now slowly improving.  Still needs to self cath but more ambulatory overall.     He is asking for answers on the cause of his myelitis and if doxycycline could have been the culprit  He was told by a family friend that she had nausea and vomiting with doxycycline and querry if it could be the cause of his seizures, encephalopathy, etc..    Review of Systems  Constitutional:  Negative for fatigue and unexpected weight change.  Eyes:  Negative for visual disturbance.  Neurological:  Negative for dizziness and light-headedness.      Objective:   Physical Exam Eyes:     General: No scleral icterus. Pulmonary:     Effort: Pulmonary effort is normal.  Neurological:     Mental Status: He is alert.     Comments: In a wheelchair but moving all extremities.            Assessment & Plan:

## 2021-05-27 NOTE — Assessment & Plan Note (Signed)
I discussed the negative lab findings from the hospital and from Dr. Linden Dolin and no positive findings to identify the cause of his myelitis.  I discussed that likely no etiology willl be identified at this point and syndrome such as a post infectious etiology most likely but no way to positively identify.  From an ID standpoint, no further work up or treatment indicated so he can follow up as needed. 30 minutes spent including 15 minutes on discussion of findings

## 2021-05-29 ENCOUNTER — Telehealth: Payer: Self-pay | Admitting: *Deleted

## 2021-05-29 ENCOUNTER — Encounter: Payer: Self-pay | Admitting: Physical Medicine and Rehabilitation

## 2021-05-29 NOTE — Telephone Encounter (Signed)
Request Reference Number: [id number]. ENOXAPARIN INJ 40/0.4ML is approved through 05/29/2022. Your patient may now fill this prescription and it will be covered.

## 2021-05-29 NOTE — Telephone Encounter (Signed)
Prior auth submitted for enoxaparin sodium to insurance via CoverMyMeds.

## 2021-05-30 ENCOUNTER — Other Ambulatory Visit: Payer: Self-pay

## 2021-05-30 ENCOUNTER — Ambulatory Visit
Admission: RE | Admit: 2021-05-30 | Discharge: 2021-05-30 | Disposition: A | Discharge status: Home / Self Care | Payer: 59 | Source: Ambulatory Visit | Attending: Neurology | Admitting: Neurology

## 2021-05-30 DIAGNOSIS — G0490 Encephalitis and encephalomyelitis, unspecified: Secondary | ICD-10-CM

## 2021-05-30 DIAGNOSIS — G0491 Myelitis, unspecified: Secondary | ICD-10-CM

## 2021-05-30 MED ORDER — GADOBENATE DIMEGLUMINE 529 MG/ML IV SOLN
15.0000 mL | Freq: Once | INTRAVENOUS | Status: AC | PRN
  Administered 2021-05-30: 15 mL via INTRAVENOUS

## 2021-06-01 ENCOUNTER — Other Ambulatory Visit: Payer: Self-pay | Admitting: Neurology

## 2021-06-01 NOTE — Progress Notes (Signed)
error 

## 2021-06-19 ENCOUNTER — Other Ambulatory Visit: Payer: Self-pay

## 2021-06-19 ENCOUNTER — Encounter: Payer: Self-pay | Admitting: Physical Medicine and Rehabilitation

## 2021-06-19 ENCOUNTER — Encounter: Payer: 59 | Attending: Registered Nurse | Admitting: Physical Medicine and Rehabilitation

## 2021-06-19 VITALS — BP 131/83 | HR 85 | Temp 98.2°F | Ht 71.0 in | Wt 165.0 lb

## 2021-06-19 DIAGNOSIS — G0491 Myelitis, unspecified: Secondary | ICD-10-CM | POA: Diagnosis not present

## 2021-06-19 DIAGNOSIS — G8222 Paraplegia, incomplete: Secondary | ICD-10-CM

## 2021-06-19 DIAGNOSIS — R252 Cramp and spasm: Secondary | ICD-10-CM | POA: Diagnosis not present

## 2021-06-19 MED ORDER — BACLOFEN 5 MG PO TABS: 15.0000 mg | ORAL_TABLET | Freq: Every day | ORAL | 5 refills | Status: DC

## 2021-06-19 NOTE — Patient Instructions (Addendum)
Pt is a 35 yr old male with recent hx of encephalitis and myelitis with T5 incomplete paraplegia; on Keppra for seizure prophylaxis due to encephalitis; Neurogenic bowel and bladder-R heel DTI; sweating above his lesion and developing spasticity.  Here for hospital f/u on incomplete paraplegia.    If there's a backorder on dulcolax suppositories- then ask for glycerin suppositories. Can always use 2- since they aren't as strong.   2. Try to change Baclofen to 15 - can increase to 20 mg nightly- for spasticity-  If not enough for spasms; will try low dose Zanaflex or Valium. Prefer Zanaflex due to his career long term.   3.  Can go back to classes, but not work at this time. As lng as not overexerting. Supposed to start late January- will print out letter for pt to give for class.   4. Will discuss at next visit, possible return to work.    5. Has tone/tightness/spasticity- esp when stands up or gets up in the morning- if it gets worse with reducing baclofen during the day; or over time, let me know.  Keep an eye out for it.   6.   Saw Neurology- they decide if can stop Seizure meds.  But with encephalitis, usually needs for 6-12 months minimum.   7.  F/U in 3 months- Paraplegia  8.  If PT therapist is OK with driving, then I'm ok with it.

## 2021-06-19 NOTE — Progress Notes (Signed)
Subjective:    Patient ID: James Obrien, male    DOB: 11/25/1985, 35 y.o.   MRN: [id number]  HPI Pt is a 35 yr old male with recent hx of encephalitis and myelitis with paraplegia; on Keppra for seizure prophylaxis due to encephalitis; Neurogenic bowel and bladder-R heel DTI; sweating above his lesion and developing spasticity.  Here for hospital f/u on paraplegia.    Bowel- sometimes goes without suppository- uses suppository if cannot go- had difficulty getting suppositories at [facility]- still taking pills- senokot, not miralax. 1-2/day.   Bladder- only goes spontaneously when big BM- however still cathing- cathing at most 3-4x/day- volumes- tries to do q6-8 hours. In Am, it's more; when doesn't cath overnight.  No accidents.    pain- sometimes lower back pain- after sleeps all night- laying in 1 position all night. The only other time is when cleaning or bending a lot- or sits - stands after long period.  Mainly the inner leg also  feel like a bruise- esp inner leg- minor irritation.  Has to wear long underwear to help discomfort.  R foot- like has a piece of glass in it- or salt and lemon juice thrown at him- occurs randomly- explained this is due to nerves waking up.     Spasticity- Still taking baclofen- 3x/day- hasn't helped muscle twitching.  Doesn't have muscle twitches during day, even if forgets Baclofen. Makes it hard to fall asleep due to spasms.  Even took Melatonin 10 mg or Nyquil- 1 large dose.   Temp dysregulation- still has night sweats from time to time and feet ice cold when rest of him HOT.  Not as frequent, but still is severe.     Gait- now walking with single point cane.   Showering standing; walks around house without cane; but uses for long distances.  Because gets tired.       Pain Inventory Average Pain 2 Pain Right Now 1 My pain is aching  LOCATION OF PAIN  knee  BOWEL Number of stools per week: 7 Oral laxative use No  Enema or  suppository use Yes  dulcolax History of colostomy No  Incontinent No   BLADDER In and out cath, frequency 3-4 and sometimes can go on his own Able to self cath Yes    Mobility use a cane ability to climb steps?  yes do you drive?  no  Function not employed: date last employed 03/26/21  Neuro/Psych trouble walking  Prior Studies Any changes since last visit?  no  Physicians involved in your care Any changes since last visit?  no   Family History  Problem Relation Age of Onset   Seizures Neg Hx    Social History   Socioeconomic History   Marital status: Married    Spouse name: Not on file   Number of children: Not on file   Years of education: Not on file   Highest education level: Not on file  Occupational History   Not on file  Tobacco Use   Smoking status: Former    Types: Cigarettes   Smokeless tobacco: Never  Vaping Use   Vaping Use: Never used  Substance and Sexual Activity   Alcohol use: Yes    Comment: occ   Drug use: Never   Sexual activity: Not on file  Other Topics Concern   Not on file  Social History Narrative   Only one in family who works.   Social Determinants of Corporate investment banker  Strain: Not on file  Food Insecurity: Not on file  Transportation Needs: Not on file  Physical Activity: Not on file  Stress: Not on file  Social Connections: Not on file   Past Surgical History:  Procedure Laterality Date   NO PAST SURGERIES     Past Medical History:  Diagnosis Date   Medical history non-contributory    BP 131/83    Pulse 85    Temp 98.2 F (36.8 C)    Ht 5\' 11"  (1.803 m)    Wt 165 lb (74.8 kg)    SpO2 96%    BMI 23.01 kg/m   Opioid Risk Score:   Fall Risk Score:  `1  Depression screen PHQ 2/9  Depression screen [facility] 2/9 06/19/2021 05/27/2021 05/07/2021  Decreased Interest 0 0 0  Down, Depressed, Hopeless 0 0 1  PHQ - 2 Score 0 0 1  Altered sleeping - - 0  Tired, decreased energy - - 1  Change in appetite - - 0   Feeling bad or failure about yourself  - - 1  Trouble concentrating - - 0  Moving slowly or fidgety/restless - - 0  Suicidal thoughts - - 0  PHQ-9 Score - - 3    Review of Systems  Constitutional:  Positive for diaphoresis.  HENT: Negative.    Eyes: Negative.   Respiratory: Negative.    Cardiovascular: Negative.   Gastrointestinal: Negative.   Endocrine: Negative.   Genitourinary:        Self caths  Musculoskeletal:  Positive for gait problem.  Skin: Negative.   Allergic/Immunologic: Negative.   Hematological: Negative.   Psychiatric/Behavioral: Negative.    All other systems reviewed and are negative.     Objective:   Physical Exam  Awake, alert, appropriate, using single point cane; NAD  Neuro: MAS of 1+ in LE's at hips/knees and ankles- using tone to walk.  4-5  beats clonus on R ankle, but 2-3 beats on L- Intact to light touch in Ue's B/L; but still decreased at T5 and below B/L  MS: HF 5/5; KE 5-/5; DF and PF 4+/5       Assessment & Plan:   Pt is a 35 yr old male with recent hx of encephalitis and myelitis with T5 incomplete paraplegia; on Keppra for seizure prophylaxis due to encephalitis; Neurogenic bowel and bladder-R heel DTI; sweating above his lesion and developing spasticity.  Here for hospital f/u on incomplete paraplegia.    If there's a backorder on dulcolax suppositories- then ask for glycerin suppositories. Can always use 2- since they aren't as strong.   2. Try to change Baclofen to 15 - can increase to 20 mg nightly- for spasticity-  If not enough for spasms; will try low dose Zanaflex or Valium. Prefer Zanaflex due to his career long term.   3.  Can go back to classes, but not work at this time. As lng as not overexerting. Supposed to start late January- will print out letter for pt to give for class.   4. Will discuss at next visit, possible return to work.    5. Has tone/tightness/spasticity- esp when stands up or gets up in the morning-  if it gets worse with reducing baclofen during the day; or over time, let me know.  Keep an eye out for it.   6.   Saw Neurology- they decide if can stop Seizure meds.  But with encephalitis, usually needs for 6-12 months minimum.   7.  F/U  in 3 months- Paraplegia  8. If PT therapist is OK with driving, then I'm ok with it.   I spent a total of 33 minutes on visit- as detailed above

## 2021-09-10 ENCOUNTER — Ambulatory Visit (INDEPENDENT_AMBULATORY_CARE_PROVIDER_SITE_OTHER): Payer: Commercial Managed Care - PPO | Admitting: Neurology

## 2021-09-10 ENCOUNTER — Encounter: Payer: Self-pay | Admitting: Neurology

## 2021-09-10 ENCOUNTER — Other Ambulatory Visit: Payer: Self-pay

## 2021-09-10 VITALS — BP 128/81 | HR 95 | Ht 71.0 in | Wt 160.2 lb

## 2021-09-10 DIAGNOSIS — R569 Unspecified convulsions: Secondary | ICD-10-CM

## 2021-09-10 DIAGNOSIS — G0491 Myelitis, unspecified: Secondary | ICD-10-CM

## 2021-09-10 DIAGNOSIS — G0490 Encephalitis and encephalomyelitis, unspecified: Secondary | ICD-10-CM | POA: Diagnosis not present

## 2021-09-10 NOTE — Progress Notes (Signed)
? ?[facility] ? ?PATIENT: James Obrien ?DOB: 12-28-1985 ? ?REFERRING DOCTOR OR PCP: Lauraine Rinne PA-C ?SOURCE:, Extensive notes from long hospital stay, lab reports, imaging reports, MRI images personally reviewed. ? ?_________________________________ ? ? ?HISTORICAL ? ?CHIEF COMPLAINT:  ?Chief Complaint  ?Patient presents with  ? Follow-up  ?  Rm 18, alone. Here for 4 month f/u for myelitis. Has been off keppra for months. No sz like activity. Has had leg twitching at night, taking baclofen PRN. Having to use a catheter 1-2x a day. Finished PT 1.5 months and walking fine.    ? ? ?HISTORY OF PRESENT ILLNESS:  ?James Obrien is a 36 y.o. man with a history of seizure and myelitis. ? ?UPDATE 08/31/2021: ?He is doing much better and walking without an aid.  He still notes some ankle clonus with certain ankle positions.   He still needs to use a catheter 1-2 times a day (usually in the morning and at night).   He wears Depends to be safe and is able to go voluntarily at times during the day but with a lot of hesitancy.   Rapaflo probably only helps a little bit and he wonders if leakage is worse with it.   He had ED and reduced ejaculation which is improved but  not to baseline.     ? ?He get some lg spasticity and twitching at night.   He takes baclofen some nights when he does worse.    He notes when he stands up his first few steps are a little off balanced but then he does better.    He stopped doing to PT 6 weeks ago as he continued to  improve.      He can bend over and pick items up now which he was unable to do.    ? ?He notes more sensation in his legs and groin than he had, though not at baseline.    He is able to differentiate hot and cold now.     ? 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 ?He has not had any more seizures or related spells  since 02/2021.     ? ?He had no preceding infection before the episode.   He had his prior Covid vaccination at least 8 mpnths earlier ? ? ?History of transverse myelitis and seizure:  ?On 03/27/2021, he felt baseline in the morning.   He had no cognitive issues, weakness or numbness while at work and was walking well.     As the day proceeded he had several 20-30 second episodes of twitching and numbness of the left hand.   After a few events, another employee was called over and then he had twitching in the hand again and also numbness that went to the arm to the face.   He then recalls falling to the ground and the other person yelling to call for an ambulance.  He then apparently had a witnessed generalized tonic-clonic seizure lasting a couple minutes.  An ambulance was called and he had a second GTC in the ambulance.  He presented to [facility] ED.   In the emergency room, he received IV Ativan and Keppra and had no further seizures.  The MRI of the brain was abnormal showing a large right hemispheric and smaller subcortical and deep white matter foci  and a couple foci in the pons.  They had patchy enhancement.    ? ?He does not recall the next couple of days but reports being told that he became less confused.  Upon being more alert, he was noted to have complete weakness in his legs.  He had a Foley catheter but was later found to also have urinary retention.  Due to the weakness he had imaging of the lumbar and thoracic spine 03/30/2021.  It showed longitudinally extensive patchy lesions in the central spinal cord from at least C7-T10 (dedicated cervical spine images were) explaining his weakness and bladder dysfunction.  He received 3 days of IV Solu-Medrol from 03/30/2021 to 04/01/2021.  While in the hospital he also had an EEG that showed asymmetric cortical dysfunction.  He improved and on 04/06/2021 was transferred to rehab for a couple weeks (Dr. Lovorn).  He was discharged and is doing outpatient therapy  closer to his home in [place]. ? ?Of note, a few days before his seizure, he was placed on doxycycline for cellulitis of the groin and some lesions on his penis.     He had presented to the ED afte he noted swelling in the groin.   He though he had a spider bite.  He received IV ceftriaxone and was discharged on p.o. doxycycline.     ? 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 ? ?Imaging studies: ?MRI of the brain 03/28/2021 shows a large focus in the posterior frontal anterior parietal lobe (pre and postcentral gyrus) with patchy enhancement.  Additional enhancing foci  is noted in the left occipital lobe , and another in the pons ? ?MRI of the thoracic spine 03/30/2021 shows a longitudinal extensive central T2 hyperintense lesion extending from at least T3-T11.  There could also be patchy T2 hyperintensity in the upper thoracic and lower cervical cord ? ?MRI of the brain 05/30/2021 showed improvement with near resolution of the right parietal T2 hyperintense focus and resolution of the subdural and pontine foci ? ?MRI of the cervical and thoracic spine 05/30/2021 showed scattered T2 hyperintense foci throughout the cervical spine and thoracic spine.  There was no abnormal enhancement. ? ?Laboratory test: ?Anti-NMO IgG negative ?HIV-1 RNA negative   HIV Ab negative   Neisseria Gonorrhea positive ?ACE = 30 (normal) ?CRP and ESR normal ?Covid-19 RT-PCR  negative ? ?CSF 03/29/2021  104 WBC (20% neutrophils; 64% lymphs; 13% mono; 2% eosinophils); Glucose=55 9normal); protein = 109 (elevated); crypto negative ?CSF HSV 1/2 negative' VZV PCR negative; VDRL negative ? ?04/20/21: ANCA and ANA negative ? ? ?REVIEW OF SYSTEMS: ? ?Constitutional: No fevers, chills, sweats, or change in appetite ?Eyes: No visual changes, double vision, eye pain ?Ear, nose and throat: No hearing loss, ear pain, nasal congestion, sore throat ?Cardiovascular: No chest pain,  palpitations ?Respiratory:  No shortness of breath at rest or with exertion.   No wheezes ?GastrointestinaI: He has severe constipation and needs to use suppositories. ?Genitourinary: He has urinary retention and does self intermittent catheterization. ?Musculoskeletal:  No neck pain, back pain ?Integumentary: No rash, pruritus, skin lesions ?Neurological: as above ?Psychiatric: No depression at this time.  No anxiety ?Endocrine: No palpitations, diaphoresis, change in appetite, change in weigh or increased thirst ?Hematologic/Lymphatic:  No anemia, purpura, petechiae. ?Allergic/Immunologic: No itchy/runny eyes, nasal congestion, recent allergic reactions, rashes ? ?ALLERGIES: ?No Known Allergies ? ?HOME MEDICATIONS: ? ?Current Outpatient Medications:  ?  Baclofen 5 MG TABS, Take 15-20   mg by mouth at bedtime. (Patient taking differently: Take 15-20 mg by mouth daily as needed.), Disp: 120 tablet, Rfl: 5 ?  bisacodyl (DULCOLAX) 10 MG suppository, Place 1 suppository (10 mg total) rectally daily at 6 PM. (Patient taking differently: Place 10 mg rectally daily as needed.), Disp: 12 suppository, Rfl: 0 ?  silodosin (RAPAFLO) 8 MG CAPS capsule, Take 1 capsule (8 mg total) by mouth daily with breakfast., Disp: 30 capsule, Rfl: 11 ? ?PAST MEDICAL HISTORY: ?Past Medical History:  ?Diagnosis Date  ? Medical history non-contributory   ? ? ?PAST SURGICAL HISTORY: ?Past Surgical History:  ?Procedure Laterality Date  ? NO PAST SURGERIES    ? ? ?FAMILY HISTORY: ?Family History  ?Problem Relation Age of Onset  ? Seizures Neg Hx   ? ? ?SOCIAL HISTORY: ? ?Social History  ? ?Socioeconomic History  ? Marital status: Married  ?  Spouse name: Not on file  ? Number of children: Not on file  ? Years of education: Not on file  ? Highest education level: Not on file  ?Occupational History  ? Not on file  ?Tobacco Use  ? Smoking status: Former  ?  Types: Cigarettes  ? Smokeless tobacco: Never  ?Vaping Use  ? Vaping Use: Never used   ?Substance and Sexual Activity  ? Alcohol use: Yes  ?  Comment: occ  ? Drug use: Never  ? Sexual activity: Not on file  ?Other Topics Concern  ? Not on file  ?Social History Narrative  ? Only one in family who works.  ? ?Social Dete

## 2021-09-12 LAB — ANTI-MOG, SERUM: MOG Antibody, Cell-based IFA: NEGATIVE

## 2021-09-20 ENCOUNTER — Other Ambulatory Visit: Payer: Self-pay

## 2021-09-20 ENCOUNTER — Encounter: Payer: Commercial Managed Care - PPO | Attending: Registered Nurse | Admitting: Physical Medicine and Rehabilitation

## 2021-09-20 ENCOUNTER — Encounter: Payer: Self-pay | Admitting: Physical Medicine and Rehabilitation

## 2021-09-20 VITALS — BP 143/81 | HR 92 | Ht 71.0 in | Wt 163.0 lb

## 2021-09-20 DIAGNOSIS — R252 Cramp and spasm: Secondary | ICD-10-CM | POA: Diagnosis not present

## 2021-09-20 DIAGNOSIS — G0491 Myelitis, unspecified: Secondary | ICD-10-CM | POA: Insufficient documentation

## 2021-09-20 DIAGNOSIS — G8222 Paraplegia, incomplete: Secondary | ICD-10-CM | POA: Diagnosis not present

## 2021-09-20 MED ORDER — BACLOFEN 5 MG PO TABS: 15.0000 mg | ORAL_TABLET | Freq: Every day | ORAL | 5 refills | Status: DC

## 2021-09-20 NOTE — Patient Instructions (Signed)
?  Pt is a 36 yr old male with recent hx of encephalitis and transverse myelitis-  and myelitis with T5 incomplete paraplegia; on Keppra for seizure prophylaxis due to encephalitis; Neurogenic bowel and bladder-R heel DTI; sweating above his lesion and developing spasticity.  ?Here for f/u on incomplete paraplegia with continued neurogenic bowel and bladder.  ?Is now ASIA E!!!!!! ? ? ?Con't Rapaflo per Dr Felecia Shelling. For bladder.  ? ?2.  Pt can go back to work-unlimited and full time as of today 09/20/21.  ? ?3. Continue Baclofen- 15-20 mg nightly and as needed during the day- 10 mg -  ? ?4. Spasticity can get a little worse over 1-2 years, HOWEVER his so far is getting better so hopeful for this.  ? ? ?5. Neurogenic bowel- con't suppository as needed ? ?6. No UTIs, so sounds like emptying bladder enough- con't cathing at least 1-3x/day. Has incomplete urinary emptying.  ? ?7.  F/U in 6 months- ?Call if any issues.  ?

## 2021-09-20 NOTE — Progress Notes (Signed)
? ?Subjective:  ? ? Patient ID: James Obrien, male    DOB: May 04, 1986, 37 y.o.   MRN: [id number] ? 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  ?Here for f/u on incomplete paraplegia ? ? Done with w/c, RW, and cane- ?Hasn't used since early January.  ?Has been out of PT for a few months.  ? ?No near falls or falls.  ?Can pick stuff up and carry stuff- can do light jog . ? ?Has been climbing ladders, etc, no issues.  ? ?Only problem he has- when sits for awhile, then stands up, a little stiff.  ?Uses baclofen as needed- during day and takes Baclofen 15-20 mg nightly depends on Sx's.  ? ?Hasn't  taken Keppra "in awhile"- no seizures or problems since stopped it.  ?Has been driving-  ? ? ?No pain- just the stiffness- like driving here for 2 hours.  ? ?Still cathing in the morning-  ?Can "kind of go on own"- massages bladder externally- and helps. On Rapaflo- 8 mg daily- ? ?Can feel when it's cold outside-  ?More noticeable that it's cold outside- ?So sensation is coming back some.  ? ?Bowel- has been doing well- uses suppository if hasn't gone in 2-3 days, but in the most part goes every other day- just went today.  ?Still struggles holding it sometimes.  ? ? ? ? ?Pain Inventory ?Average Pain 0 ?Pain Right Now 0 ?My pain is  No pain ? ?In the last 24 hours, has pain interfered with the following? ?General activity 0 ?Relation with others 0 ?Enjoyment of life 0 ?What TIME of day is your pain at its worst? varies ?Sleep (in general) Fair ? ?Pain is worse with:  No pain ?Pain improves with:  No pain ?Relief from Meds:  No pain ? ?Family History  ?Problem Relation Age of Onset  ? Seizures Neg Hx   ? ?Social History  ? ?Socioeconomic History  ? Marital status: Married  ?  Spouse name: Not on file  ? Number of  children: Not on file  ? Years of education: Not on file  ? Highest education level: Not on file  ?Occupational History  ? Not on file  ?Tobacco Use  ? Smoking status: Former  ?  Types: Cigarettes  ? Smokeless tobacco: Never  ?Vaping Use  ? Vaping Use: Never used  ?Substance and Sexual Activity  ? Alcohol use: Yes  ?  Comment: occ  ? Drug use: Never  ? Sexual activity: Not on file  ?Other Topics Concern  ? Not on file  ?Social History Narrative  ? Only one in family who works.  ? ?Social Determinants of Health  ? ?Financial Resource Strain: Not on file  ?Food Insecurity: Not on file  ?Transportation Needs: Not on file  ?Physical Activity: Not on file  ?Stress: Not on file  ?Social Connections: Not on file  ? ?Past Surgical History:  ?Procedure Laterality Date  ? NO PAST SURGERIES    ? ?Past Surgical History:  ?Procedure Laterality Date  ? NO PAST SURGERIES    ? ?Past Medical History:  ?Diagnosis Date  ? Medical history non-contributory   ? ?BP (!) 143/81   Pulse 92   Ht 5\' 11"  (1.803 m)   Wt 163 lb (73.9 kg)   SpO2  96%   BMI 22.73 kg/m?  ? ?Opioid Risk Score:   ?Fall Risk Score:  `1 ? ?Depression screen PHQ 2/9 ? ? ?  09/20/2021  ? 10:10 AM 06/19/2021  ? 10:36 AM 05/27/2021  ?  2:47 PM 05/07/2021  ?  1:02 PM  ?Depression screen PHQ 2/9  ?Decreased Interest 0 0 0 0  ?Down, Depressed, Hopeless 0 0 0 1  ?PHQ - 2 Score 0 0 0 1  ?Altered sleeping    0  ?Tired, decreased energy    1  ?Change in appetite    0  ?Feeling bad or failure about yourself     1  ?Trouble concentrating    0  ?Moving slowly or fidgety/restless    0  ?Suicidal thoughts    0  ?PHQ-9 Score    3  ?  ? ?Review of Systems  ?Constitutional: Negative.   ?HENT: Negative.    ?Eyes: Negative.   ?Respiratory: Negative.    ?Cardiovascular: Negative.   ?Gastrointestinal: Negative.   ?Endocrine: Negative.   ?Genitourinary: Negative.   ?Musculoskeletal: Negative.   ?Skin: Negative.   ?Allergic/Immunologic: Negative.   ?Neurological: Negative.   ?Hematological:  Negative.   ?Psychiatric/Behavioral: Negative.    ? ?   ?Objective:  ? Physical Exam ? ?Awake, alert, appropriate, no AD, NAD ?Sensation still decreased, but now at L1, not T5 anymore.  ?MS: 5/5 in HF/KE/DF and PF as well as 5-/5 in EHL B/L ? ? ?Neuro: ?Mas of 1 to 1+ in B/L LE's ?Few beats of clonus B/L R>L ?   ?Assessment & Plan:  ? ?Pt is a 36 yr old male with recent hx of encephalitis and transverse myelitis-  and myelitis with T5 incomplete paraplegia; on Keppra for seizure prophylaxis due to encephalitis; Neurogenic bowel and bladder-R heel DTI; sweating above his lesion and developing spasticity.  ?Here for f/u on incomplete paraplegia with continued neurogenic bowel and bladder.  ?Is now ASIA E!!!!!! ? ? ?Con't Rapaflo per Dr Epimenio Foot. For bladder.  ? ?2.  Pt can go back to work-unlimited and full time as of today 09/20/21.  ? ?3. Continue Baclofen- 15-20 mg nightly and as needed during the day- 10 mg -  ? ?4. Spasticity can get a little worse over 1-2 years, HOWEVER his so far is getting better so hopeful for this.  ? ? ?5. Neurogenic bowel- con't suppository as needed ? ?6. No UTIs, so sounds like emptying bladder enough- con't cathing at least 1-3x/day. Has incomplete urinary emptying.  ? ?7.  F/U in 6 months- ?Call if any issues.  ? ? ?I spent a total of  22  minutes on total care today- >50% coordination of care- due to d/w spasticity and prognosis.  ? ?

## 2022-03-26 ENCOUNTER — Encounter: Payer: Commercial Managed Care - PPO | Admitting: Physical Medicine and Rehabilitation

## 2022-05-30 ENCOUNTER — Ambulatory Visit: Payer: Commercial Managed Care - PPO | Admitting: Physical Medicine and Rehabilitation

## 2022-07-21 ENCOUNTER — Encounter: Payer: Self-pay | Admitting: Physical Medicine and Rehabilitation

## 2022-07-21 ENCOUNTER — Other Ambulatory Visit: Payer: Self-pay | Admitting: Physical Medicine and Rehabilitation

## 2022-07-21 ENCOUNTER — Encounter
Payer: Commercial Managed Care - PPO | Attending: Physical Medicine and Rehabilitation | Admitting: Physical Medicine and Rehabilitation

## 2022-07-21 VITALS — BP 136/76 | HR 88 | Ht 71.0 in | Wt 137.0 lb

## 2022-07-21 DIAGNOSIS — R252 Cramp and spasm: Secondary | ICD-10-CM | POA: Diagnosis not present

## 2022-07-21 DIAGNOSIS — N319 Neuromuscular dysfunction of bladder, unspecified: Secondary | ICD-10-CM | POA: Insufficient documentation

## 2022-07-21 DIAGNOSIS — G8222 Paraplegia, incomplete: Secondary | ICD-10-CM | POA: Insufficient documentation

## 2022-07-21 MED ORDER — SILODOSIN 8 MG PO CAPS: 8.0000 mg | ORAL_CAPSULE | Freq: Every day | ORAL | 11 refills | Status: DC

## 2022-07-21 MED ORDER — SILDENAFIL CITRATE 100 MG PO TABS: 100.0000 mg | ORAL_TABLET | Freq: Every day | ORAL | 5 refills | Status: DC | PRN

## 2022-07-21 MED ORDER — BACLOFEN 5 MG PO TABS: 15.0000 mg | ORAL_TABLET | Freq: Every day | ORAL | 5 refills | Status: DC

## 2022-07-21 NOTE — Patient Instructions (Addendum)
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  Here for f/u on incomplete paraplegia  Will restart Baclofen 15-20 mg at night and 10 mg in AM for spasticity- would start back 5 mg in Am and 10 mg at night- and over the next week, can go back to prior dose.   2. Will try to restart Rapaflo- to try and reduce number of caths. Has tried Flomax without getting a good response and was suggested by Neurology and Urology. If things gets better, keep taking it- if no improvement, can stop taking it. In terms of how many caths required.   3. No UTIs since seen last-   4. Can take over Erectile dysfunction meds- will try Viagra- will try 100 mg as needed- will send in 100 mg - max dose is 100 mg in a 24 hours period-  #30- 5 refills.   5.  F/U in 6 months. Need to be seen 2x/year.

## 2022-07-21 NOTE — Progress Notes (Signed)
Subjective:    Patient ID: James Obrien, male    DOB: July 04, 1985, 37 y.o.   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  Here for f/u on incomplete paraplegia  Physically, doing well Still having spasticity at night- ran out of baclofen. For awhile.  Would like more.   Still cathing ~ 2x/day-  At work, doing OK Sometimes on weekends- might have to cath a third time during day.  Hasn't been taking Rapaflo, since hasn't seen doctor for 6+ months.  Thinks it might have been helpful in past.  Doesn't need suppository too much- 1x/ every ~2 weeks that uses it.  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   Can run and walk with no issues.   Gets [facility] for erectile dysfunction- usually needs Viagra- 25 mg prn 1-2 tabs.   Pain Inventory Average Pain 0 Pain Right Now 0 My pain is  no pain  LOCATION OF PAIN  no pain  BOWEL Number of stools per week: 4 Oral laxative use No  Type of laxative . Enema or suppository use No  History of colostomy No  Incontinent No   BLADDER Foley In and out cath, frequency 1-2 a day Able to self cath Yes  Bladder incontinence No  Frequent urination No  Leakage with coughing No  Difficulty starting stream No  Incomplete bladder emptying No    Mobility walk without assistance ability to climb steps?  yes do you drive?  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  no  Physicians involved in your care Any changes since last visit?  no   Family History  Problem Relation Age of Onset   Seizures Neg Hx    Social History   Socioeconomic History   Marital status: Married     Spouse name: Not on file   Number of children: Not on file   Years of education: Not on file   Highest education level: Not on file  Occupational History   Not on file  Tobacco Use   Smoking status: Former    Types: Cigarettes   Smokeless tobacco: Never  Vaping Use   Vaping Use: Never used  Substance and Sexual Activity   Alcohol use: Yes    Comment: occ   Drug use: Never   Sexual activity: Not on file  Other Topics Concern   Not on file  Social History Narrative   Only one in family who works.   Social Determinants of Health   Financial Resource Strain: Not on file  Food Insecurity: Not on file  Transportation Needs: Not on file  Physical Activity: Not on file  Stress: Not on file  Social Connections: Not on file   Past Surgical History:  Procedure Laterality Date   NO PAST SURGERIES     Past Medical History:  Diagnosis Date   Medical history non-contributory    BP 136/76   Pulse 88   Ht 5\' 11"  (1.803 m)   Wt 137 lb (62.1 kg)   SpO2 95%   BMI 19.11 kg/m   Opioid Risk Score:   Fall Risk Score:  `1  Depression  screen PHQ 2/9     09/20/2021   10:10 AM 06/19/2021   10:36 AM 05/27/2021    2:47 PM 05/07/2021    1:02 PM  Depression screen PHQ 2/9  Decreased Interest 0 0 0 0  Down, Depressed, Hopeless 0 0 0 1  PHQ - 2 Score 0 0 0 1  Altered sleeping    0  Tired, decreased energy    1  Change in appetite    0  Feeling bad or failure about yourself     1  Trouble concentrating    0  Moving slowly or fidgety/restless    0  Suicidal thoughts    0  PHQ-9 Score    3     Review of Systems  Musculoskeletal:        Spasms  All other systems reviewed and are negative.     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  Here for f/u on incomplete paraplegia  Will restart Baclofen 15-20 mg at night and 10 mg in AM for spasticity- would start back 5 mg in Am and 10 mg at night- and over the next week, can go back to prior dose.   2. Will try to restart Rapaflo- to try and reduce number of caths. Has tried Flomax without getting a good response and was suggested by Neurology and Urology.  If things gets better, keep taking it- if no improvement, can stop taking it. In terms of how many caths required.   3. No UTIs since seen last-   4. Can take over Erectile dysfunction meds- will try Viagra- will try 100 mg as needed- will send in 100 mg - max dose is 100 mg in a 24 hours period-  #30- 5 refills.   5.  F/U in 6 months.   I spent a total of  24  minutes on total care today- >50% coordination of care- due to  discussion about intimacy- and spasticity.

## 2022-08-20 ENCOUNTER — Ambulatory Visit: Payer: Commercial Managed Care - PPO | Admitting: Physical Medicine and Rehabilitation

## 2023-01-19 ENCOUNTER — Ambulatory Visit: Payer: Commercial Managed Care - PPO | Admitting: Physical Medicine and Rehabilitation

## 2023-01-30 ENCOUNTER — Encounter
Payer: Commercial Managed Care - PPO | Attending: Physical Medicine and Rehabilitation | Admitting: Physical Medicine and Rehabilitation

## 2023-01-30 ENCOUNTER — Encounter: Payer: Self-pay | Admitting: Physical Medicine and Rehabilitation

## 2023-01-30 VITALS — BP 131/87 | HR 70 | Ht 71.0 in | Wt 145.0 lb

## 2023-01-30 DIAGNOSIS — R252 Cramp and spasm: Secondary | ICD-10-CM | POA: Insufficient documentation

## 2023-01-30 DIAGNOSIS — N319 Neuromuscular dysfunction of bladder, unspecified: Secondary | ICD-10-CM | POA: Insufficient documentation

## 2023-01-30 DIAGNOSIS — R208 Other disturbances of skin sensation: Secondary | ICD-10-CM | POA: Insufficient documentation

## 2023-01-30 DIAGNOSIS — G8222 Paraplegia, incomplete: Secondary | ICD-10-CM | POA: Insufficient documentation

## 2023-01-30 MED ORDER — SILDENAFIL CITRATE 100 MG PO TABS: 100.0000 mg | ORAL_TABLET | Freq: Every day | ORAL | 5 refills | Status: DC | PRN

## 2023-01-30 NOTE — Progress Notes (Signed)
Subjective:    Patient ID: James Obrien, male    DOB: September 30, 1985, 37 y.o.   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 Trying to keep kids away from him.  Trying to do what has to do for kids.     Muscle spasticity- still gets them- As long as active, but if sits still for prolonged periods, will act up.  Taking Baclofen- as needed-  Legs twitch at night and kicks GF sometimes.   At least takes 5 mg Baclofen at night time. Or every other night. Doesn't take during day anymore.   Caths 1-2x/day still - sometimes 3x/day- feels like needs to pee, and cannot, or cannot empty- feels like needs to pee 10 minutes later.   Doing kegals to strengthen muscles.  A little better- still won't do on its own- all the time; still has to do suprapubic massage and valsalva, not tapping   Viagra- has tried cutting in half- no side effects from 100 mg-  The day after, pees a little more the next day.  Helped out a lot! Some nights, tries without Viagra- sometimes it works without Viagra- like for a little bit, then needs Viagra- trying to not be "dependent  on Viagra". Also under a lot of stress lately.    Drives 1.5 hours- to and from work.  Sometimes 2 hours on the way home.  Sometime soon, might be a job 30 minutes way.   Sometimes stiffness in low back and knees- when drives a long distance or lays/sits long periods.   Hasn't had an UTI lately.   Pain Inventory Average Pain 2 Pain Right Now 0 My pain is  na  In the last 24 hours, has pain interfered with the following? General activity 0 Relation with others 0 Enjoyment of life 0 What TIME of day is your pain at its worst? varies Sleep (in general) Fair  Pain is worse with:  inactivity Pain improves with:  . Relief from Meds: 5  Family History  Problem Relation Age of Onset   Seizures Neg Hx    Social History   Socioeconomic History   Marital status: Divorced    Spouse name: Not on file   Number of children: Not on file   Years of education: Not on file   Highest education level: Not on file  Occupational History   Not on file  Tobacco Use   Smoking status: Former    Types: Cigarettes   Smokeless tobacco: Never  Vaping Use   Vaping status: Never Used  Substance and Sexual Activity   Alcohol use: Yes    Comment: occ   Drug use: Never   Sexual activity: Not on file  Other Topics Concern   Not on file  Social History Narrative   Only one in family who works.   Social Determinants of Health   Financial Resource Strain: Not on file  Food Insecurity: Not on file  Transportation Needs: Not on file  Physical Activity: Not on file  Stress: Not on file  Social Connections: Not on file   Past Surgical History:  Procedure Laterality Date   NO PAST SURGERIES     Past Surgical History:  Procedure Laterality Date  NO PAST SURGERIES     Past Medical History:  Diagnosis Date   Medical history non-contributory    BP 131/87   Pulse 70   Ht 5\' 11"  (1.803 m)   Wt 145 lb (65.8 kg)   SpO2 98%   BMI 20.22 kg/m   Opioid Risk Score:   Fall Risk Score:  `1  Depression screen [facility] 2/9     09/20/2021   10:10 AM 06/19/2021   10:36 AM 05/27/2021    2:47 PM 05/07/2021    1:02 PM  Depression screen PHQ 2/9  Decreased Interest 0 0 0 0  Down, Depressed, Hopeless 0 0 0 1  PHQ - 2 Score 0 0 0 1  Altered sleeping    0  Tired, decreased energy    1  Change in appetite    0  Feeling bad or failure about yourself     1  Trouble concentrating    0  Moving slowly or fidgety/restless    0  Suicidal thoughts    0  PHQ-9 Score    3     Review of Systems  Musculoskeletal:  Positive for back pain.       Bilateral knee pain  All other systems  reviewed and are negative.     Objective:   Physical Exam  Awake, alert, appropriate, no assistive device, NAD  MS: 5/5 in LE's- HF, KE, KF, DF and PF all 5/5 B/L  Neuro: Clonus 3-4 beats on RLE- 2 beats on LLE No increased tone on LE's.        Assessment & Plan:   Pt is a 37 yr old male with recent hx of encephalitis and myelitis with T5 incomplete paraplegia;   ASIA E but still has  Neurogenic bowel and bladder- and spasticity- which is improing- off Seizure prophylaxis-  Here for f/u on incomplete paraplegia-ASIA E   Con't Baclofen but doesn't need refills- has 11 refills left-   2. Erectile dysfunction-  Con't Viagra 100 mg as needed- needs refill- 100 mg prn #30- 5 refills.   3.  Went over dx/prognosis and recovery to full strength-   4. Con't cathing 1-3x/day- as needed- caths daily- needs to do clean intermittent caths.   5.  Went over colonization vs UTI- sick/nauseated- don't check unless feels bad-- basically- will always look like UTI- so don't check unless feel really ill.   6.  F/U - 6 months- double visit - SCI  7. Usually smokes 1ppd-  trying to quit starting new year. Wants to wait and do with GF. Doesn't want to do with meds/nicotine patches make him sick.     I spent a total of 30   minutes on total care today- >50% coordination of care- due to  D/w pt  on spasticity-  neurogenic bladder- and erectile dysfunction.

## 2023-01-30 NOTE — Patient Instructions (Signed)
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 Erectile dysfunction-  Con't Viagra 100 mg as needed- needs refill- 100 mg prn #30- 5 refills.   3.  Went over dx/prognosis and recovery to full strength-   4. Con't cathing 1-3x/day- as needed- caths daily- needs to do clean intermittent caths.   5.  Went over colonization vs UTI- sick/nauseated- don't check unless feels bad-- basically- will always look like UTI- so don't check unless feel really ill.   6.  F/U - 6 months- double visit - SCI

## 2023-03-17 IMAGING — MR MR CERVICAL SPINE WO/W CM
6 of 9 series · 24 of 48 positions shown · IV contrast (agent unspecified)
Comparison: Brain MRI 03/28/2021, 03/29/2021

Thoracic spine MRI 03/30/2021
COMPARISON: Brain MRI 03/28/2021, 03/29/2021

Thoracic spine MRI 03/30/2021

Addendum:
CLINICAL DATA: Demyelinating disease and seizure.

EXAM:
MRI HEAD WITHOUT AND WITH CONTRAST
MRI CERVICAL SPINE WITHOUT AND WITH CONTRAST
MRI THORACIC SPINE WITHOUT AND WITH CONTRAST
TECHNIQUE: Multiplanar, multiecho pulse sequences of the brain and surrounding
structures, and the cervical and thoracic spine were obtained
without and with intravenous contrast.

[Series 32: T1 · sagittal · 3.0mm · 0.66mm/px · 3 of 15 slices shown (1 of 2)]
[im 1/15]
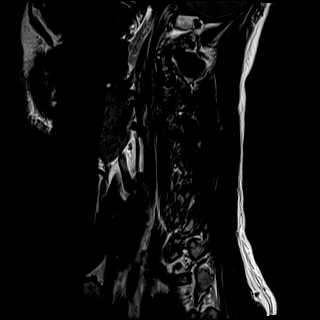
[im 8/15]
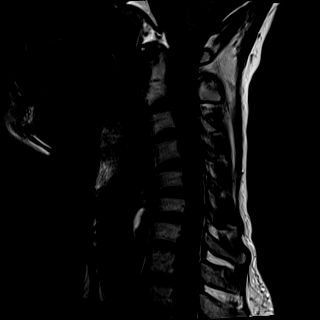
[im 15/15]
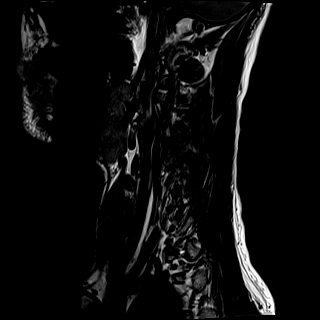

[Series 33: STIR · sagittal · 3.0mm · 0.33mm/px · 1 of 13 slices shown]
[im 1/13]
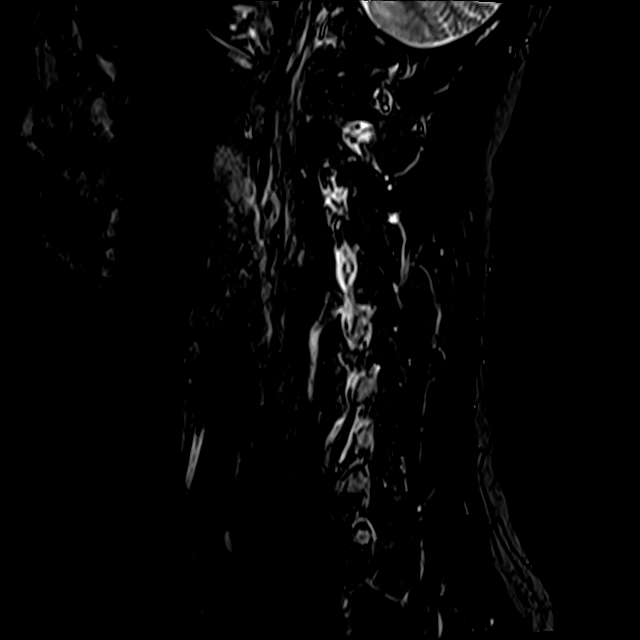

[Series 34: T2 · axial · 3.0mm · 0.62mm/px · z∈[-191,-97]mm · 7 of 30 slices shown]
[im 1/30]
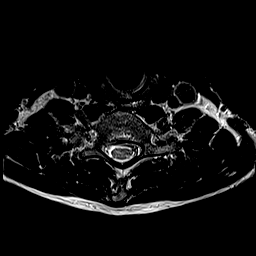
[im 5/30]
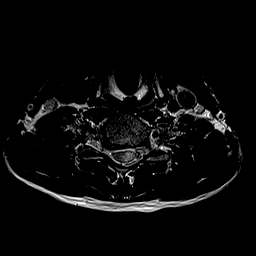
[im 10/30]
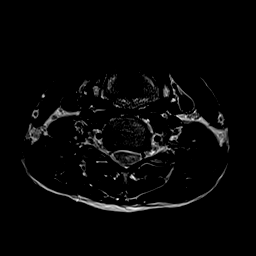
[im 15/30]
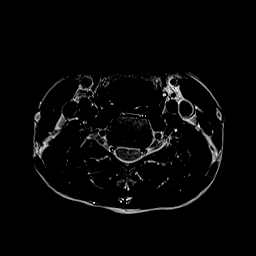
[im 20/30]
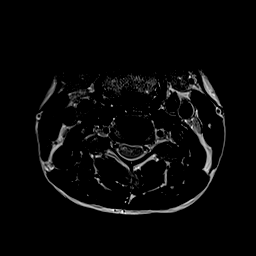
[im 25/30]
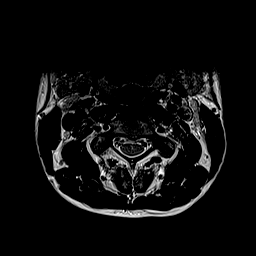
[im 30/30]
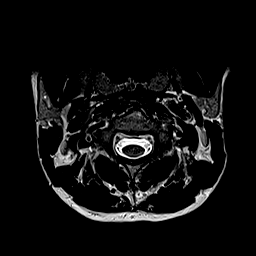

[Series 36: T1 · axial · non-contrast · 3.0mm · 0.31mm/px · z∈[-191,-97]mm · 7 of 30 slices shown (2 of 2)]
[im 1/30]
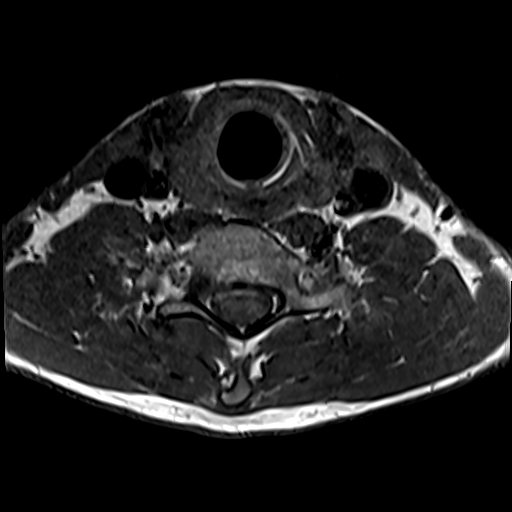
[im 5/30]
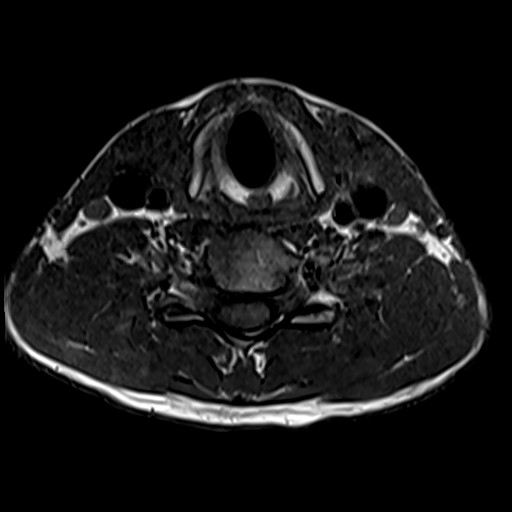
[im 10/30]
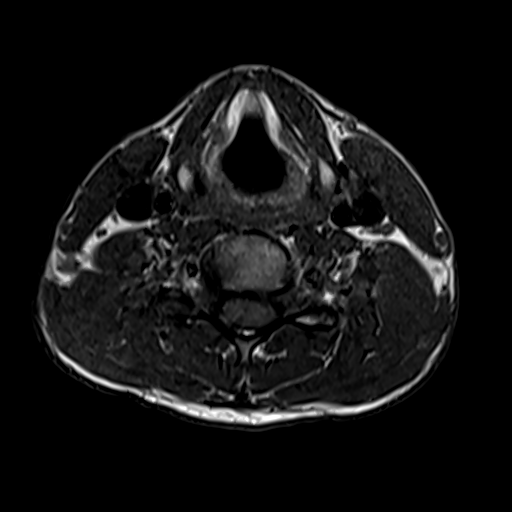
[im 15/30]
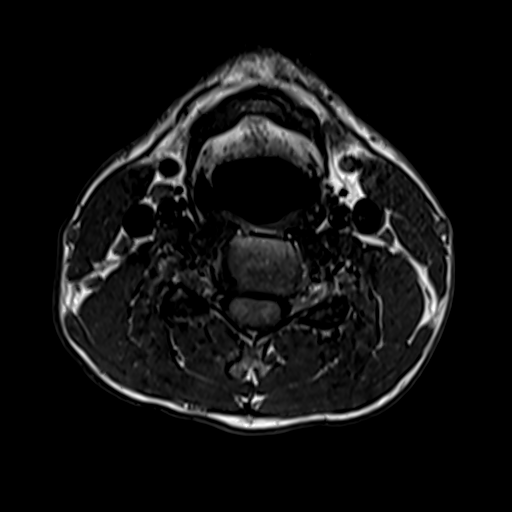
[im 20/30]
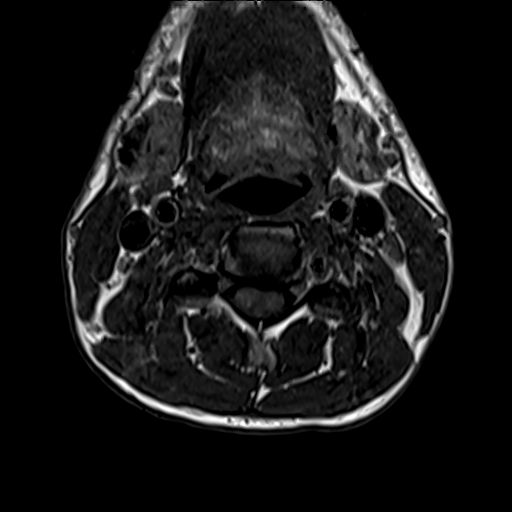
[im 25/30]
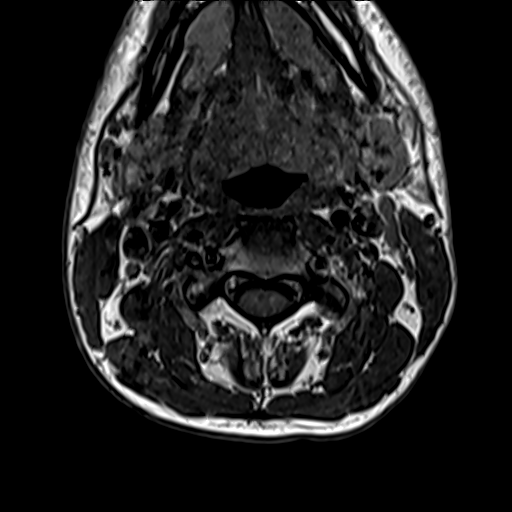
[im 30/30]
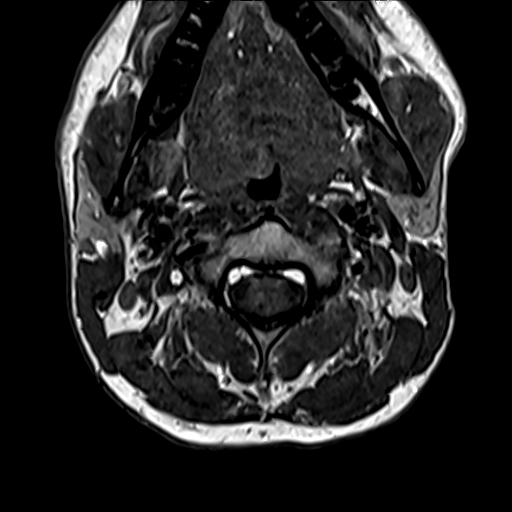

[Series 47: T1 fat-sat post-contrast · sagittal · 3.0mm · 0.69mm/px · 3 of 15 slices shown]
[im 1/15]
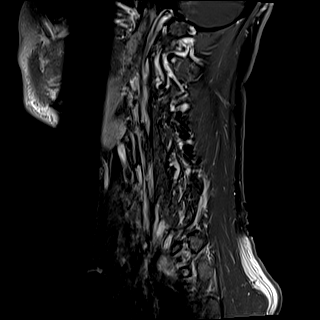
[im 8/15]
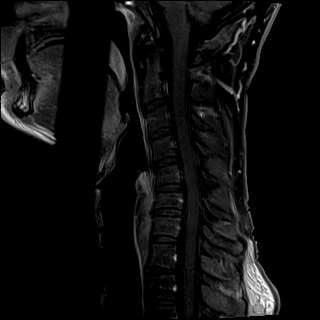
[im 15/15]
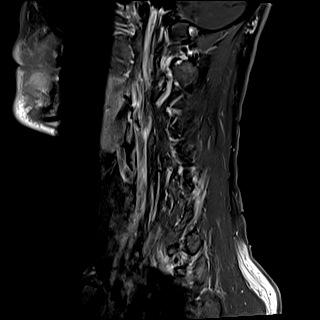

[Series 48: T2 post-contrast · sagittal · 3.0mm · 0.55mm/px · 3 of 15 slices shown]
[im 1/15]
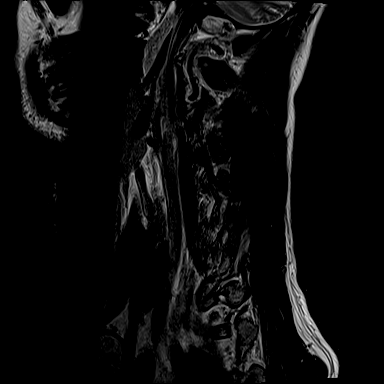
[im 8/15]
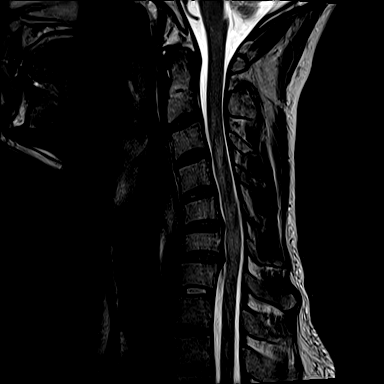
[im 15/15]
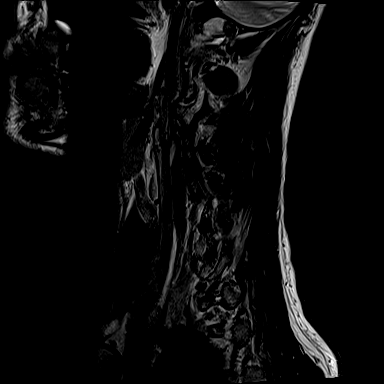

[24 of 48 positions shown; findings below may reference images not displayed]

FINDINGS: MRI HEAD FINDINGS

Brain: No acute infarct, mass effect or extra-axial collection. No
acute or chronic hemorrhage. There is faint hyperintense T2-weighted
signal within the right parietal white matter at the site of the
largest area of abnormality on the prior scan. The other cerebral
white matter lesions have resolved. No new white matter lesions. No
abnormal contrast enhancement. CSF spaces are normal. The midline
structures are normal.

Vascular: Major flow voids are preserved.

Skull and upper cervical spine: Normal calvarium and skull base.
Visualized upper cervical spine and soft tissues are normal.

Sinuses/Orbits:No paranasal sinus fluid levels or advanced mucosal
thickening. No mastoid or middle ear effusion. Normal orbits.

MRI CERVICAL SPINE FINDINGS

Alignment: Reversal of normal cervical lordosis may be positional or
due to muscle spasm.

Vertebrae: No fracture, evidence of discitis, or bone lesion.

Cord: There are multiple hyperintense T2-weighted signal lesions
throughout the cervical spinal cord, greatest at C4, C5 and C6-7.
there is no abnormal contrast enhancement.

Posterior Fossa, vertebral arteries, paraspinal tissues: Negative.

Disc levels: At C5-6, there is a small left subarticular disc
protrusion without associated stenosis.

At C6-7, there is a medium-sized right foraminal protrusion with
mild right foraminal stenosis.

MRI THORACIC SPINE FINDINGS

Alignment: Physiologic.

Vertebrae: No fracture, evidence of discitis, or bone lesion.

Cord: Diffuse abnormal T2-weighted signal hyperintensity throughout
much of the thoracic spinal cord. The images on the previous study
are markedly motion degraded, limiting comparison, but the length of
the lesions appears to be the same. There is no abnormal contrast
enhancement.

Paraspinal tissues: Negative

Disc levels: No disc herniation or stenosis.
IMPRESSION: 1. Faint residual area of hyperintense T2-weighted signal in the
right parietal white matter. The other cerebral white matter lesions
have resolved.
2. Extensive white matter lesions in the cervical and thoracic
spinal cord. No abnormal contrast enhancement. The thoracic lesions
appear unchanged. No prior imaging of the cervical spinal cord.
3. Mild cervical degenerative disc disease with mild right C6-7
neural foraminal stenosis.

ADDENDUM:
15 mL gadobenate dimeglumine (MULTIHANCE) injection intravenous
contrast agent was administered.

*** End of Addendum ***
FINDINGS: MRI HEAD FINDINGS

Brain: No acute infarct, mass effect or extra-axial collection. No
acute or chronic hemorrhage. There is faint hyperintense T2-weighted
signal within the right parietal white matter at the site of the
largest area of abnormality on the prior scan. The other cerebral
white matter lesions have resolved. No new white matter lesions. No
abnormal contrast enhancement. CSF spaces are normal. The midline
structures are normal.

Vascular: Major flow voids are preserved.

Skull and upper cervical spine: Normal calvarium and skull base.
Visualized upper cervical spine and soft tissues are normal.

Sinuses/Orbits:No paranasal sinus fluid levels or advanced mucosal
thickening. No mastoid or middle ear effusion. Normal orbits.

MRI CERVICAL SPINE FINDINGS

Alignment: Reversal of normal cervical lordosis may be positional or
due to muscle spasm.

Vertebrae: No fracture, evidence of discitis, or bone lesion.

Cord: There are multiple hyperintense T2-weighted signal lesions
throughout the cervical spinal cord, greatest at C4, C5 and C6-7.
there is no abnormal contrast enhancement.

Posterior Fossa, vertebral arteries, paraspinal tissues: Negative.

Disc levels: At C5-6, there is a small left subarticular disc
protrusion without associated stenosis.

At C6-7, there is a medium-sized right foraminal protrusion with
mild right foraminal stenosis.

MRI THORACIC SPINE FINDINGS

Alignment: Physiologic.

Vertebrae: No fracture, evidence of discitis, or bone lesion.

Cord: Diffuse abnormal T2-weighted signal hyperintensity throughout
much of the thoracic spinal cord. The images on the previous study
are markedly motion degraded, limiting comparison, but the length of
the lesions appears to be the same. There is no abnormal contrast
enhancement.

Paraspinal tissues: Negative

Disc levels: No disc herniation or stenosis.
IMPRESSION: 1. Faint residual area of hyperintense T2-weighted signal in the
right parietal white matter. The other cerebral white matter lesions
have resolved.
2. Extensive white matter lesions in the cervical and thoracic
spinal cord. No abnormal contrast enhancement. The thoracic lesions
appear unchanged. No prior imaging of the cervical spinal cord.
3. Mild cervical degenerative disc disease with mild right C6-7
neural foraminal stenosis.

## 2023-03-17 IMAGING — MR MR THORACIC SPINE WO/W CM
4 of 9 series · 18 of 48 positions shown · IV contrast (agent unspecified)
Comparison: Brain MRI 03/28/2021, 03/29/2021

Thoracic spine MRI 03/30/2021
COMPARISON: Brain MRI 03/28/2021, 03/29/2021

Thoracic spine MRI 03/30/2021

Addendum:
CLINICAL DATA: Demyelinating disease and seizure.

EXAM:
MRI HEAD WITHOUT AND WITH CONTRAST
MRI CERVICAL SPINE WITHOUT AND WITH CONTRAST
MRI THORACIC SPINE WITHOUT AND WITH CONTRAST
TECHNIQUE: Multiplanar, multiecho pulse sequences of the brain and surrounding
structures, and the cervical and thoracic spine were obtained
without and with intravenous contrast.

[Series 39: T1 · sagittal · 3.0mm · 1.56mm/px · 3 of 17 slices shown (1 of 2)]
[im 1/17]
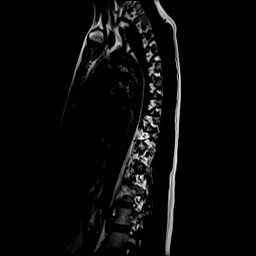
[im 9/17]
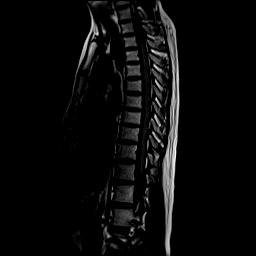
[im 17/17]
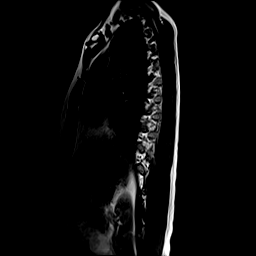

[Series 41: T1 · axial · non-contrast · 4.0mm · 0.56mm/px · z∈[-398,-237]mm · 3 of 33 slices shown (2 of 2)]
[im 5/33]
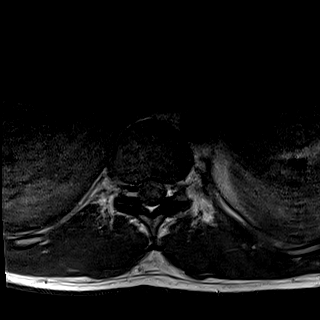
[im 19/33]
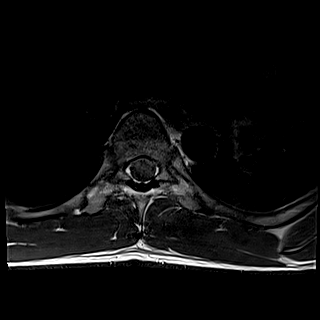
[im 28/33]
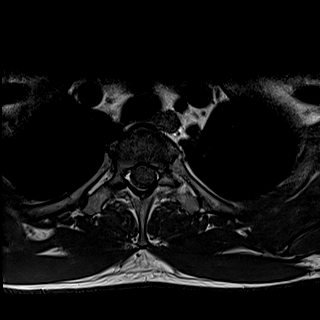

[Series 42: T2 · axial · 4.0mm · 0.28mm/px · z∈[-439,-214]mm · 8 of 33 slices shown]
[im 1/33]
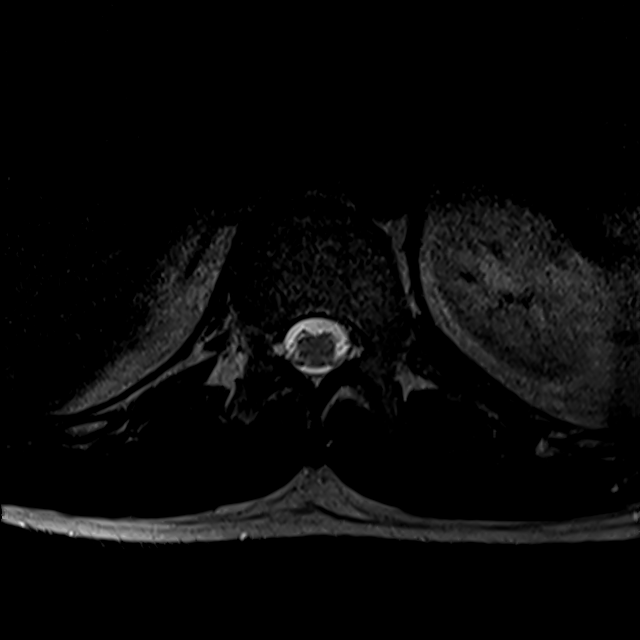
[im 5/33]
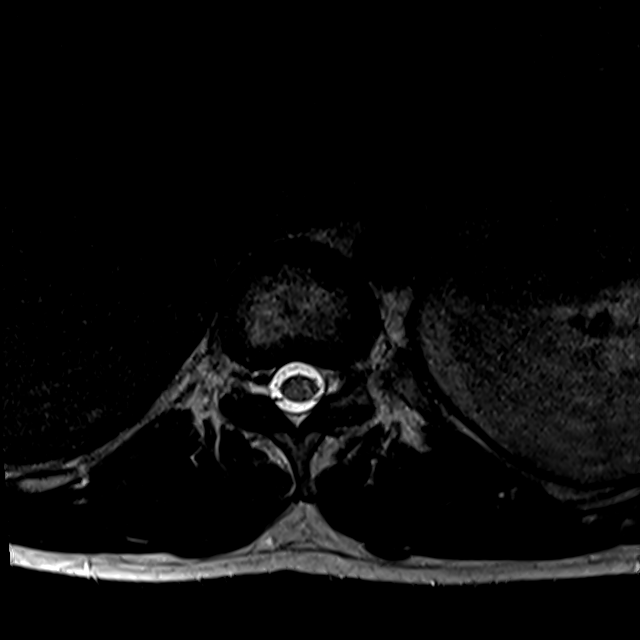
[im 10/33]
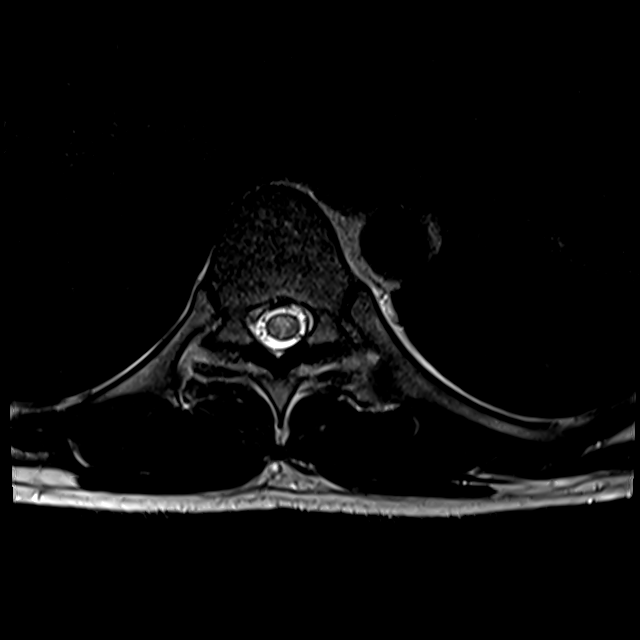
[im 14/33]
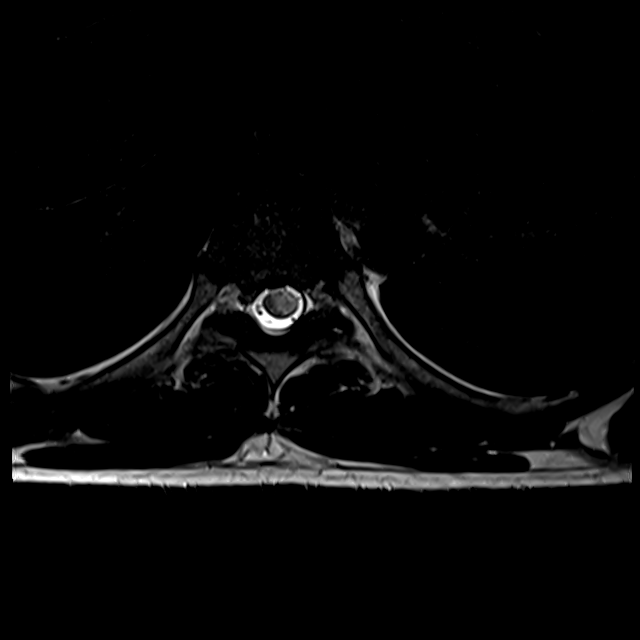
[im 19/33]
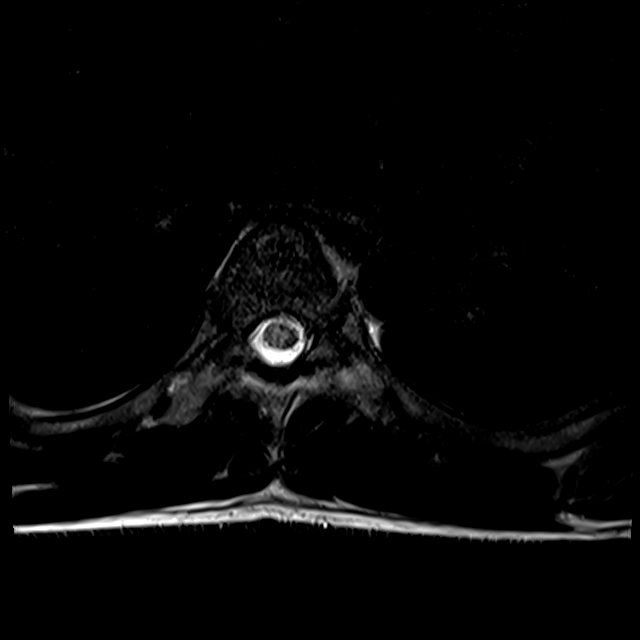
[im 23/33]
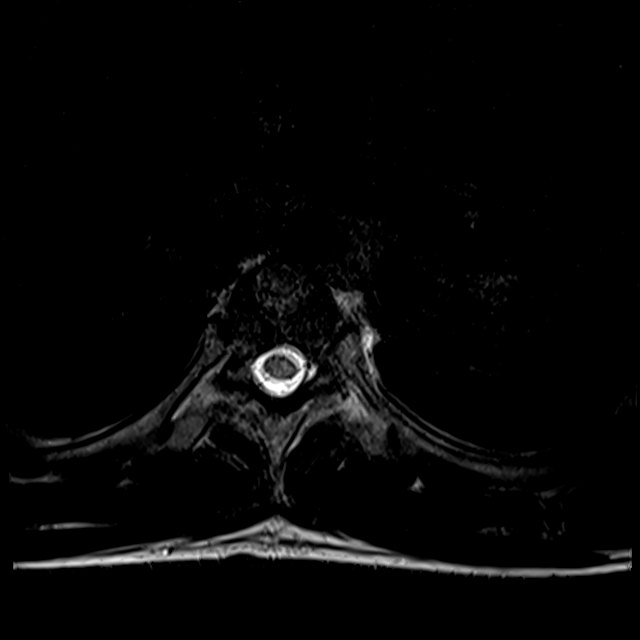
[im 28/33]
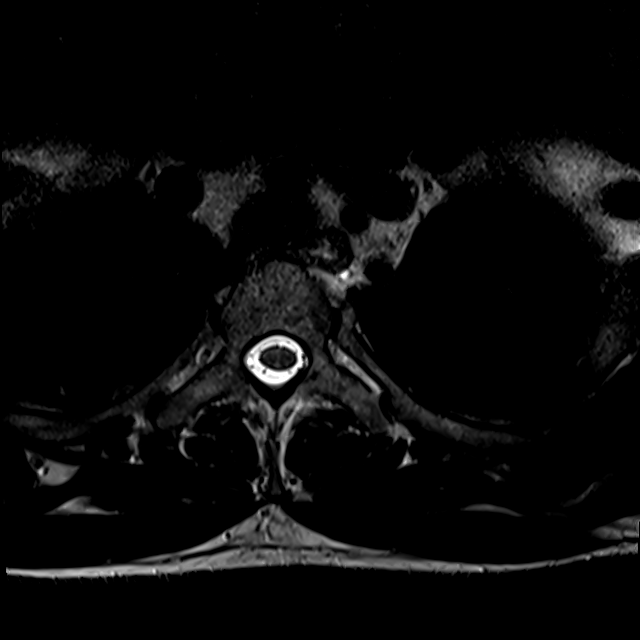
[im 33/33]
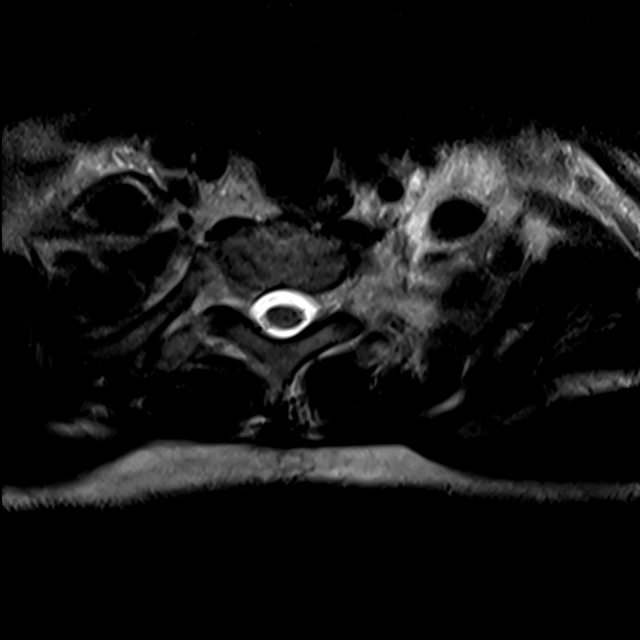

[Series 44: T2 post-contrast · sagittal · 3.0mm · 0.85mm/px · 4 of 16 slices shown]
[im 1/16]
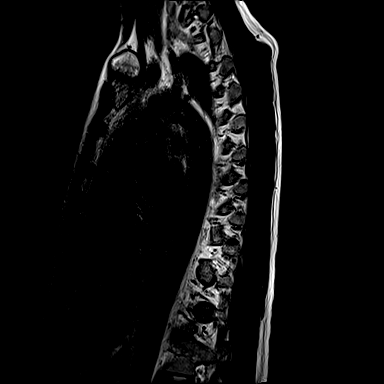
[im 6/16]
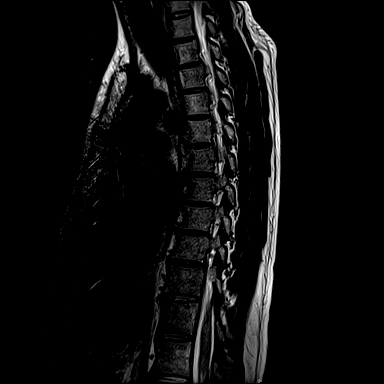
[im 11/16]
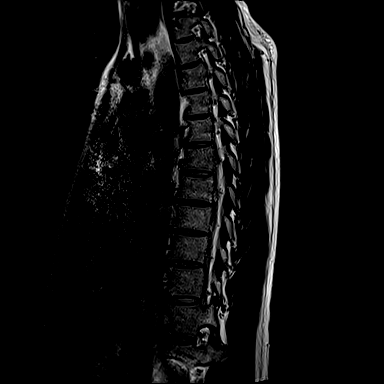
[im 16/16]
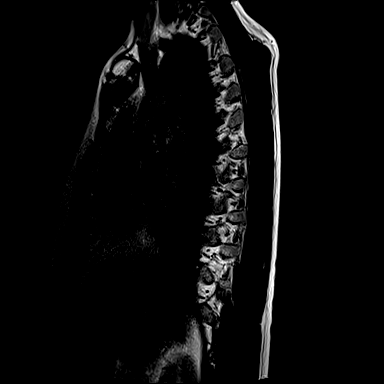

[18 of 48 positions shown; findings below may reference images not displayed]

FINDINGS: MRI HEAD FINDINGS

Brain: No acute infarct, mass effect or extra-axial collection. No
acute or chronic hemorrhage. There is faint hyperintense T2-weighted
signal within the right parietal white matter at the site of the
largest area of abnormality on the prior scan. The other cerebral
white matter lesions have resolved. No new white matter lesions. No
abnormal contrast enhancement. CSF spaces are normal. The midline
structures are normal.

Vascular: Major flow voids are preserved.

Skull and upper cervical spine: Normal calvarium and skull base.
Visualized upper cervical spine and soft tissues are normal.

Sinuses/Orbits:No paranasal sinus fluid levels or advanced mucosal
thickening. No mastoid or middle ear effusion. Normal orbits.

MRI CERVICAL SPINE FINDINGS

Alignment: Reversal of normal cervical lordosis may be positional or
due to muscle spasm.

Vertebrae: No fracture, evidence of discitis, or bone lesion.

Cord: There are multiple hyperintense T2-weighted signal lesions
throughout the cervical spinal cord, greatest at C4, C5 and C6-7.
there is no abnormal contrast enhancement.

Posterior Fossa, vertebral arteries, paraspinal tissues: Negative.

Disc levels: At C5-6, there is a small left subarticular disc
protrusion without associated stenosis.

At C6-7, there is a medium-sized right foraminal protrusion with
mild right foraminal stenosis.

MRI THORACIC SPINE FINDINGS

Alignment: Physiologic.

Vertebrae: No fracture, evidence of discitis, or bone lesion.

Cord: Diffuse abnormal T2-weighted signal hyperintensity throughout
much of the thoracic spinal cord. The images on the previous study
are markedly motion degraded, limiting comparison, but the length of
the lesions appears to be the same. There is no abnormal contrast
enhancement.

Paraspinal tissues: Negative

Disc levels: No disc herniation or stenosis.
IMPRESSION: 1. Faint residual area of hyperintense T2-weighted signal in the
right parietal white matter. The other cerebral white matter lesions
have resolved.
2. Extensive white matter lesions in the cervical and thoracic
spinal cord. No abnormal contrast enhancement. The thoracic lesions
appear unchanged. No prior imaging of the cervical spinal cord.
3. Mild cervical degenerative disc disease with mild right C6-7
neural foraminal stenosis.

ADDENDUM:
15 mL gadobenate dimeglumine (MULTIHANCE) injection intravenous
contrast agent was administered.

*** End of Addendum ***
FINDINGS: MRI HEAD FINDINGS

Brain: No acute infarct, mass effect or extra-axial collection. No
acute or chronic hemorrhage. There is faint hyperintense T2-weighted
signal within the right parietal white matter at the site of the
largest area of abnormality on the prior scan. The other cerebral
white matter lesions have resolved. No new white matter lesions. No
abnormal contrast enhancement. CSF spaces are normal. The midline
structures are normal.

Vascular: Major flow voids are preserved.

Skull and upper cervical spine: Normal calvarium and skull base.
Visualized upper cervical spine and soft tissues are normal.

Sinuses/Orbits:No paranasal sinus fluid levels or advanced mucosal
thickening. No mastoid or middle ear effusion. Normal orbits.

MRI CERVICAL SPINE FINDINGS

Alignment: Reversal of normal cervical lordosis may be positional or
due to muscle spasm.

Vertebrae: No fracture, evidence of discitis, or bone lesion.

Cord: There are multiple hyperintense T2-weighted signal lesions
throughout the cervical spinal cord, greatest at C4, C5 and C6-7.
there is no abnormal contrast enhancement.

Posterior Fossa, vertebral arteries, paraspinal tissues: Negative.

Disc levels: At C5-6, there is a small left subarticular disc
protrusion without associated stenosis.

At C6-7, there is a medium-sized right foraminal protrusion with
mild right foraminal stenosis.

MRI THORACIC SPINE FINDINGS

Alignment: Physiologic.

Vertebrae: No fracture, evidence of discitis, or bone lesion.

Cord: Diffuse abnormal T2-weighted signal hyperintensity throughout
much of the thoracic spinal cord. The images on the previous study
are markedly motion degraded, limiting comparison, but the length of
the lesions appears to be the same. There is no abnormal contrast
enhancement.

Paraspinal tissues: Negative

Disc levels: No disc herniation or stenosis.
IMPRESSION: 1. Faint residual area of hyperintense T2-weighted signal in the
right parietal white matter. The other cerebral white matter lesions
have resolved.
2. Extensive white matter lesions in the cervical and thoracic
spinal cord. No abnormal contrast enhancement. The thoracic lesions
appear unchanged. No prior imaging of the cervical spinal cord.
3. Mild cervical degenerative disc disease with mild right C6-7
neural foraminal stenosis.

## 2023-08-03 ENCOUNTER — Ambulatory Visit: Payer: Commercial Managed Care - PPO | Admitting: Physical Medicine and Rehabilitation

## 2023-08-08 ENCOUNTER — Other Ambulatory Visit: Payer: Self-pay | Admitting: Physical Medicine and Rehabilitation

## 2023-10-05 ENCOUNTER — Encounter
Payer: Commercial Managed Care - PPO | Attending: Physical Medicine and Rehabilitation | Admitting: Physical Medicine and Rehabilitation

## 2023-10-05 ENCOUNTER — Encounter: Payer: Self-pay | Admitting: Physical Medicine and Rehabilitation

## 2023-10-05 VITALS — BP 122/86 | HR 105 | Ht 71.0 in | Wt 150.0 lb

## 2023-10-05 DIAGNOSIS — R252 Cramp and spasm: Secondary | ICD-10-CM | POA: Diagnosis present

## 2023-10-05 DIAGNOSIS — N319 Neuromuscular dysfunction of bladder, unspecified: Secondary | ICD-10-CM | POA: Insufficient documentation

## 2023-10-05 DIAGNOSIS — G8222 Paraplegia, incomplete: Secondary | ICD-10-CM | POA: Insufficient documentation

## 2023-10-05 DIAGNOSIS — N521 Erectile dysfunction due to diseases classified elsewhere: Secondary | ICD-10-CM | POA: Diagnosis not present

## 2023-10-05 MED ORDER — SILDENAFIL CITRATE 100 MG PO TABS: 100.0000 mg | ORAL_TABLET | Freq: Every day | ORAL | 5 refills | Status: DC | PRN

## 2023-10-05 MED ORDER — BACLOFEN 5 MG PO TABS: 15.0000 mg | ORAL_TABLET | Freq: Every day | ORAL | 5 refills | Status: DC

## 2023-10-05 NOTE — Progress Notes (Signed)
 Subjective:    Patient ID: James Obrien, male    DOB: 1985-07-02, 38 y.o.   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  Also can push on lower abd and have a BM- not constipated.  Has only used suppository a few times in last year.    Uses in/out cath 1-2x/day- and massages lower abdomen to go as well   Sometimes can pee on own sometimes, but cannot empty completely.  Can hold  longer-  It varies how much he pees during the day - on average 3-5x/day.  And then caths 1-2x/day.  When caths- usually in AM and before bed- high volume in AM- moderate to high in evening/night time-  doesn't measure.   Sometimes sounds like 500-750cc based on 2L soda volume.   Cannot pee standing up- has to sit down.    Taking Viagra 75-100 mg- gets a full erection- not lasting more than 4 hours. But uses the Vaigra dosing again, and gos "again in 4 hours"- without 2nd pill.   Had ADHD- Caffine drinks help him calm down and pay attention.    Stressed trying to get house- so can get custody of kids.    Staying with GF- but stressors include ex wife not coparenting.  Sister issues as well .  Has to start work back up.  Doing therapy- tele-health.   Working 10 hours/day- couple jobs down the road 30 minutes away.   Spasticity- taking Baclofen 10 mg   at bedtime- some nights doesn't take, but usually at least 3-4x/week. And 1 hour beofre bed.  Sometimes gets muscle spasms in legs every now and then- few times per week- 10-15 seconds- at max. And them moves around.    Did 6 and me- something popped up about a seizure-  Not sure why?  Said  was prone to Alzheimers Prone to heart conditions-   Pain Inventory Average Pain 2 Pain Right Now 1 My pain is intermittent, dull, and aching  LOCATION OF PAIN  knees, legs  BOWEL Number of stools per week: 4 Oral laxative use No   BLADDER Normal In and out cath, frequency  2 times a day Able to self cath Yes  Difficulty starting stream  has to press on bladder to start the process   Mobility walk without assistance  Function employed # of hrs/week works 50 hours per week in [employer]  Neuro/Psych tremor spasms  Prior Studies Any changes since last visit?  no  Physicians involved in your care Any changes since last visit?  no   Family History  Problem Relation Age of Onset   Seizures Neg Hx    Social History   Socioeconomic History   Marital status: Divorced    Spouse name: Not on file   Number of children: Not on file   Years of education: Not on file   Highest education level: Not on file  Occupational History   Not on file  Tobacco Use   Smoking status: Former    Types: Cigarettes   Smokeless tobacco: Never  Vaping Use  Vaping status: Never Used  Substance and Sexual Activity   Alcohol use: Yes    Comment: occ   Drug use: Never   Sexual activity: Not on file  Other Topics Concern   Not on file  Social History Narrative   Only one in family who works.   Social Drivers of Corporate investment banker Strain: Not on file  Food Insecurity: Not on file  Transportation Needs: Not on file  Physical Activity: Not on file  Stress: Not on file  Social Connections: Not on file   Past Surgical History:  Procedure Laterality Date   NO PAST SURGERIES     Past Medical History:  Diagnosis Date   Medical history non-contributory    BP 122/86   Pulse (!) 105   Ht 5\' 11"  (1.803 m)   Wt 150 lb (68 kg)   SpO2 96%   BMI 20.92 kg/m   Opioid Risk Score:   Fall Risk Score:  `1  Depression screen [facility] 2/9     09/20/2021   10:10 AM  06/19/2021   10:36 AM 05/27/2021    2:47 PM 05/07/2021    1:02 PM  Depression screen PHQ 2/9  Decreased Interest 0 0 0 0  Down, Depressed, Hopeless 0 0 0 1  PHQ - 2 Score 0 0 0 1  Altered sleeping    0  Tired, decreased energy    1  Change in appetite    0  Feeling bad or failure about yourself     1  Trouble concentrating    0  Moving slowly or fidgety/restless    0  Suicidal thoughts    0  PHQ-9 Score    3    Review of Systems  Genitourinary:        In & out self cath  Musculoskeletal:        Legs tremors & spasms  All other systems reviewed and are negative.      Objective:   Physical Exam  Awake, alert, appropriate, NAD Walking  5/5 in UE"s and LE's A few beats clonus in LE's.       Assessment & Plan:    Looking for PCP to send to closer location to his home.    2.  Will refill Viagra 100 mg # 30- with 5 refills.    3. Not kicking GF anymore with Baclofen 10 mg nightly-    4. Went to discuss Spasticity during day- as well as during intimacy- don't have easy fix, except a little baclofen that afternoon  if need be- so let me know if wants an additional 5 mg during afternoon- and then see if it does affect intimacy. Decided to send in- for day or night time spasticity- sent in 3rd pill/day.   5.   We discussed Using Cialis 10 mg daily for intimacy, so can be more spontaneous with intimacy. Let me know through mychart.   6. 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  Hasn't had UTI in 1 year. Caths with gloves.    7. F/U in 6 months- double visit- SCI   I spent a total of 36   minutes on total care today- >50% coordination of care- due to  d/w pt about spasticity, neurogenic bowel and bladder- and erectile dysfunction.

## 2023-10-05 NOTE — Progress Notes (Deleted)
   Subjective:    Patient ID: James Obrien, male    DOB: 30-Jul-1985, 38 y.o.   MRN: [id number]  HPI   Pain Inventory Average Pain {NUMBERS; 0-10:5044} Pain Right Now {NUMBERS; 0-10:5044} My pain is {PAIN DESCRIPTION:21022940}  LOCATION OF PAIN  ***  BOWEL Number of stools per week: *** Oral laxative use {YES/NO:21197} Type of laxative *** Enema or suppository use {YES/NO:21197} History of colostomy {YES/NO:21197} Incontinent {YES/NO:21197}  BLADDER {bladder options:24190} In and out cath, frequency *** Able to self cath {YES/NO:21197} Bladder incontinence {YES/NO:21197} Frequent urination {YES/NO:21197} Leakage with coughing {YES/NO:21197} Difficulty starting stream {YES/NO:21197} Incomplete bladder emptying {YES/NO:21197}   Mobility {MOBILITY GEX:52841324}  Function {FUNCTION:21022946}  Neuro/Psych {NEURO/PSYCH:21022948}  Prior Studies {CPRM PRIOR STUDIES:21022953}  Physicians involved in your care {CPRM PHYSICIANS INVOLVED IN YOUR CARE:21022954}   Family History  Problem Relation Age of Onset   Seizures Neg Hx    Social History   Socioeconomic History   Marital status: Divorced    Spouse name: Not on file   Number of children: Not on file   Years of education: Not on file   Highest education level: Not on file  Occupational History   Not on file  Tobacco Use   Smoking status: Former    Types: Cigarettes   Smokeless tobacco: Never  Vaping Use   Vaping status: Never Used  Substance and Sexual Activity   Alcohol use: Yes    Comment: occ   Drug use: Never   Sexual activity: Not on file  Other Topics Concern   Not on file  Social History Narrative   Only one in family who works.   Social Drivers of Corporate investment banker Strain: Not on file  Food Insecurity: Not on file  Transportation Needs: Not on file  Physical Activity: Not on file  Stress: Not on file  Social Connections: Not on file   Past Surgical History:   Procedure Laterality Date   NO PAST SURGERIES     Past Medical History:  Diagnosis Date   Medical history non-contributory    Ht 5\' 11"  (1.803 m)   Wt 150 lb (68 kg)   BMI 20.92 kg/m   Opioid Risk Score:   Fall Risk Score:  `1  Depression screen [facility] 2/9     09/20/2021   10:10 AM 06/19/2021   10:36 AM 05/27/2021    2:47 PM 05/07/2021    1:02 PM  Depression screen PHQ 2/9  Decreased Interest 0 0 0 0  Down, Depressed, Hopeless 0 0 0 1  PHQ - 2 Score 0 0 0 1  Altered sleeping    0  Tired, decreased energy    1  Change in appetite    0  Feeling bad or failure about yourself     1  Trouble concentrating    0  Moving slowly or fidgety/restless    0  Suicidal thoughts    0  PHQ-9 Score    3    Review of Systems     Objective:   Physical Exam        Assessment & Plan:

## 2023-10-05 NOTE — Patient Instructions (Addendum)
  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    2.  Will refill Viagra 100 mg # 30- with 5 refills.    3. Not kicking GF anymore with Baclofen 10 mg nightly-    4. Went to discuss Spasticity during day- as well as during intimacy- don't have easy fix, except a little baclofen that afternoon  if need be- so let me know if wants an additional 5 mg during afternoon- and then see if it does affect intimacy. Decided to send in- for day or night time spasticity- sent in 3rd pill/day.   5.   We discussed Using Cialis 10 mg daily for intimacy, so can be more spontaneous with intimacy. Let me know through mychart.   6. 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  Hasn't had UTI in 1 year. Caths with gloves.    7. F/U in 6 months- double visit- SCI

## 2024-02-09 ENCOUNTER — Encounter: Payer: Self-pay | Admitting: Physical Medicine and Rehabilitation

## 2024-04-08 ENCOUNTER — Encounter: Payer: Self-pay | Admitting: Physical Medicine and Rehabilitation

## 2024-04-08 ENCOUNTER — Encounter: Attending: Physical Medicine and Rehabilitation | Admitting: Physical Medicine and Rehabilitation

## 2024-04-08 VITALS — BP 122/73 | HR 77 | Ht 71.0 in | Wt 141.4 lb

## 2024-04-08 DIAGNOSIS — N319 Neuromuscular dysfunction of bladder, unspecified: Secondary | ICD-10-CM | POA: Insufficient documentation

## 2024-04-08 DIAGNOSIS — G8222 Paraplegia, incomplete: Secondary | ICD-10-CM | POA: Diagnosis not present

## 2024-04-08 DIAGNOSIS — M778 Other enthesopathies, not elsewhere classified: Secondary | ICD-10-CM | POA: Insufficient documentation

## 2024-04-08 DIAGNOSIS — R252 Cramp and spasm: Secondary | ICD-10-CM | POA: Insufficient documentation

## 2024-04-08 DIAGNOSIS — N521 Erectile dysfunction due to diseases classified elsewhere: Secondary | ICD-10-CM | POA: Diagnosis not present

## 2024-04-08 MED ORDER — SILDENAFIL CITRATE 100 MG PO TABS: 100.0000 mg | ORAL_TABLET | Freq: Every day | ORAL | 5 refills | Status: AC | PRN

## 2024-04-08 MED ORDER — BACLOFEN 5 MG PO TABS: 15.0000 mg | ORAL_TABLET | Freq: Every day | ORAL | 5 refills | Status: AC

## 2024-04-08 NOTE — Progress Notes (Signed)
 Subjective:    Patient ID: James Obrien, male    DOB: [birth year], 38 y.o.   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  Due to ectopic pregnancy of fiance'  Little tender- esp first 2 weeks, but nothing else changed.  Tylenol  and ibuprofen  works well and ice packs.    Just normal back and leg pains.   At night, having  warming tingling sensations in feet- warm but also tingling.  Lasts Gets distracted so cannot say how long it lasts  Some nights- no twitching and some nights, legs jump badly   Baclofen  doesn't want to work with it On board- but never takes more than 10-15 mg- but occ during- day and isn't tired.   Mainly takes 10-15 mg at bedtime of baclofen .   Having pain in back of elbow-  on L side Mainly when cranks a bolt-  Going to pick up copper sleeve.    Hasn't smoked in 4 months- Uses toothpick to subside cravings-  and using vape some. And using less- trying to stop completely.    GF stopping slower- is paralegal- but she's weaning as well.    Still caths BID- at bedtime and qAM-  No UTI in since went home.  Had 3 accidents in bed- might drink too much before bed-  Caths RIGHT before bed.  Uses gloves to cath.  Single use catheters where adds lube   Doing well with Viagra .    Pain Inventory Average Pain 3 Pain Right Now 1 My pain is tingling and aching  In the last 24 hours, has pain interfered with the following? General activity 1 Relation with others 1 Enjoyment of life 1 What TIME of day is your pain at its worst? evening Sleep (in general) Fair  Pain is worse with: inactivity and some activites Pain improves with: medication Relief from Meds: 6  Family History   Problem Relation Age of Onset   Seizures Neg Hx    Social History   Socioeconomic History   Marital status: Divorced    Spouse name: Not on file   Number of children: Not on file   Years of education: Not on file   Highest education level: Not on file  Occupational History   Not on file  Tobacco Use   Smoking status: Former    Types: Cigarettes   Smokeless tobacco: Never  Vaping Use   Vaping status: Never Used  Substance and Sexual Activity   Alcohol use: Yes    Comment: occ   Drug use: Never   Sexual activity: Not on file  Other Topics Concern   Not on file  Social History Narrative   Only one in family who works.   Social Drivers of Corporate investment banker Strain: Not on file  Food Insecurity: Not on file  Transportation Needs: Not on file  Physical Activity: Not on file  Stress: Not on file  Social Connections: Not on file   Past Surgical History:  Procedure Laterality Date   NO PAST SURGERIES     Past Surgical History:  Procedure Laterality Date   NO PAST SURGERIES     Past Medical History:  Diagnosis Date  Medical history non-contributory    BP 122/73 (BP Location: Left Arm, Patient Position: Sitting, Cuff Size: Normal)   Pulse 77   Ht 5' 11 (1.803 m)   Wt 141 lb 6.4 oz (64.1 kg)   SpO2 97%   BMI 19.72 kg/m   Opioid Risk Score:   Fall Risk Score:  `1  Depression screen PHQ 2/9     04/08/2024    2:00 PM 10/05/2023    2:43 PM 09/20/2021   10:10 AM 06/19/2021   10:36 AM 05/27/2021    2:47 PM 05/07/2021    1:02 PM  Depression screen PHQ 2/9  Decreased Interest 0 1 0 0 0 0  Down, Depressed, Hopeless 0 1 0 0 0 1  PHQ - 2 Score 0 2 0 0 0 1  Altered sleeping      0  Tired, decreased energy      1  Change in appetite      0  Feeling bad or failure about yourself       1  Trouble concentrating      0  Moving slowly or fidgety/restless      0  Suicidal thoughts      0  PHQ-9 Score      3      Review of Systems  Musculoskeletal:   Positive for back pain and myalgias.       Low back pain, bilateral leg pain  All other systems reviewed and are negative.      Objective:   Physical Exam  Awake, alert, appropriate, sitting on exam table, NAD TTP -mild- on triceps- insertion into elbow on LUE.  Is L handed.  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   2. Suggest L elbow sleeve that plans on picking up. To help keep pressure on tissues.   3. Con't Baclofen   we discussed increasing baclofen  to 2x/day - he wants to stay at 15 mg at bedtime- which is fine   4.  Using Viagra - 100 mg working well - will send in a refill for pt- we discussed Cialis- daily- but he's comfortable with what's working.   5. F/U in 6 months- single appt. SCI  6. Work note given so missed work today.    I spent a total of  25  minutes on total care today- >50% coordination of care- due to d/w pt about tendinitis, his spasticity, and erectile dysfunction.

## 2024-04-08 NOTE — Patient Instructions (Signed)
  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   2. Suggest L elbow sleeve that plans on picking up. To help keep pressure on tissues.   3. Con't Baclofen   we discussed increasing baclofen  to 2x/day - he wants to stay at 15 mg at bedtime- which is fine   4.  Using Viagra - 100 mg working well - will send in a refill for pt- we discussed Cialis- daily- but he's comfortable with what's working.   5. F/U in 6 months- single appt. SCI

## 2024-10-07 ENCOUNTER — Encounter: Admitting: Physical Medicine and Rehabilitation
